# Patient Record
Sex: Female | Born: 1995 | Race: Black or African American | Hispanic: No | Smoking: Former smoker
Health system: Southern US, Community
[De-identification: ages and names within clinical notes are randomized; demographics above are authoritative.]

## PROBLEM LIST (undated history)

## (undated) DIAGNOSIS — J45909 Unspecified asthma, uncomplicated: Secondary | ICD-10-CM

## (undated) DIAGNOSIS — A6000 Herpesviral infection of urogenital system, unspecified: Secondary | ICD-10-CM

## (undated) DIAGNOSIS — M797 Fibromyalgia: Secondary | ICD-10-CM

## (undated) DIAGNOSIS — E282 Polycystic ovarian syndrome: Secondary | ICD-10-CM

## (undated) DIAGNOSIS — E7800 Pure hypercholesterolemia, unspecified: Secondary | ICD-10-CM

## (undated) DIAGNOSIS — E119 Type 2 diabetes mellitus without complications: Secondary | ICD-10-CM

## (undated) DIAGNOSIS — I10 Essential (primary) hypertension: Secondary | ICD-10-CM

## (undated) DIAGNOSIS — F419 Anxiety disorder, unspecified: Secondary | ICD-10-CM

## (undated) HISTORY — PX: TONSILLECTOMY AND ADENOIDECTOMY: SHX28

## (undated) HISTORY — DX: Type 2 diabetes mellitus without complications: E11.9

## (undated) HISTORY — PX: TONSILLECTOMY: SUR1361

## (undated) HISTORY — DX: Anxiety disorder, unspecified: F41.9

## (undated) HISTORY — DX: Fibromyalgia: M79.7

## (undated) HISTORY — DX: Essential (primary) hypertension: I10

## (undated) HISTORY — PX: OTHER SURGICAL HISTORY: SHX169

## (undated) HISTORY — DX: Polycystic ovarian syndrome: E28.2

---
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
## 2015-01-27 ENCOUNTER — Emergency Department (HOSPITAL_COMMUNITY)
Admission: EM | Admit: 2015-01-27 | Discharge: 2015-01-27 | Disposition: A | Discharge status: Home / Self Care | Payer: Medicaid Other | Attending: Emergency Medicine | Admitting: Emergency Medicine

## 2015-01-27 ENCOUNTER — Encounter (HOSPITAL_COMMUNITY): Payer: Self-pay | Admitting: *Deleted

## 2015-01-27 ENCOUNTER — Emergency Department (HOSPITAL_COMMUNITY): Payer: Medicaid Other

## 2015-01-27 DIAGNOSIS — S80211A Abrasion, right knee, initial encounter: Secondary | ICD-10-CM | POA: Diagnosis not present

## 2015-01-27 DIAGNOSIS — Z7951 Long term (current) use of inhaled steroids: Secondary | ICD-10-CM | POA: Insufficient documentation

## 2015-01-27 DIAGNOSIS — E1165 Type 2 diabetes mellitus with hyperglycemia: Secondary | ICD-10-CM | POA: Insufficient documentation

## 2015-01-27 DIAGNOSIS — M542 Cervicalgia: Secondary | ICD-10-CM

## 2015-01-27 DIAGNOSIS — M25561 Pain in right knee: Secondary | ICD-10-CM

## 2015-01-27 DIAGNOSIS — Y92410 Unspecified street and highway as the place of occurrence of the external cause: Secondary | ICD-10-CM | POA: Diagnosis not present

## 2015-01-27 DIAGNOSIS — S29001A Unspecified injury of muscle and tendon of front wall of thorax, initial encounter: Secondary | ICD-10-CM | POA: Diagnosis not present

## 2015-01-27 DIAGNOSIS — J45909 Unspecified asthma, uncomplicated: Secondary | ICD-10-CM | POA: Diagnosis not present

## 2015-01-27 DIAGNOSIS — S80212A Abrasion, left knee, initial encounter: Secondary | ICD-10-CM | POA: Insufficient documentation

## 2015-01-27 DIAGNOSIS — Z8619 Personal history of other infectious and parasitic diseases: Secondary | ICD-10-CM | POA: Insufficient documentation

## 2015-01-27 DIAGNOSIS — Y9389 Activity, other specified: Secondary | ICD-10-CM | POA: Diagnosis not present

## 2015-01-27 DIAGNOSIS — I10 Essential (primary) hypertension: Secondary | ICD-10-CM | POA: Diagnosis not present

## 2015-01-27 DIAGNOSIS — E780 Pure hypercholesterolemia: Secondary | ICD-10-CM | POA: Diagnosis not present

## 2015-01-27 DIAGNOSIS — Y998 Other external cause status: Secondary | ICD-10-CM | POA: Diagnosis not present

## 2015-01-27 DIAGNOSIS — Z792 Long term (current) use of antibiotics: Secondary | ICD-10-CM | POA: Diagnosis not present

## 2015-01-27 DIAGNOSIS — Z720 Tobacco use: Secondary | ICD-10-CM | POA: Diagnosis not present

## 2015-01-27 DIAGNOSIS — S199XXA Unspecified injury of neck, initial encounter: Secondary | ICD-10-CM | POA: Diagnosis not present

## 2015-01-27 DIAGNOSIS — Z79899 Other long term (current) drug therapy: Secondary | ICD-10-CM | POA: Insufficient documentation

## 2015-01-27 DIAGNOSIS — S8991XA Unspecified injury of right lower leg, initial encounter: Secondary | ICD-10-CM | POA: Diagnosis present

## 2015-01-27 LAB — BASIC METABOLIC PANEL
ANION GAP: 8 (ref 5–15)
BUN: 9 mg/dL (ref 6–20)
CHLORIDE: 102 mmol/L (ref 101–111)
CO2: 23 mmol/L (ref 22–32)
Calcium: 9 mg/dL (ref 8.9–10.3)
Creatinine, Ser: 0.46 mg/dL (ref 0.44–1.00)
GFR calc Af Amer: >60 mL/min (ref >60)
GFR calc non Af Amer: >60 mL/min (ref >60)
Glucose, Bld: 379 mg/dL — ABNORMAL HIGH (ref 65–99)
POTASSIUM: 3.7 mmol/L (ref 3.5–5.1)
Sodium: 133 mmol/L — ABNORMAL LOW (ref 135–145)

## 2015-01-27 LAB — CBC WITH DIFFERENTIAL/PLATELET
BASOS PCT: 0 % (ref 0–1)
Basophils Absolute: 0 10*3/uL (ref 0.0–0.1)
Eosinophils Absolute: 0 10*3/uL (ref 0.0–0.7)
Eosinophils Relative: 0 % (ref 0–5)
HCT: 36.6 % (ref 36.0–46.0)
Hemoglobin: 12 g/dL (ref 12.0–15.0)
Lymphocytes Relative: 21 % (ref 12–46)
Lymphs Abs: 2.1 10*3/uL (ref 0.7–4.0)
MCH: 24.6 pg — AB (ref 26.0–34.0)
MCHC: 32.8 g/dL (ref 30.0–36.0)
MCV: 75 fL — ABNORMAL LOW (ref 78.0–100.0)
MONO ABS: 0.7 10*3/uL (ref 0.1–1.0)
Monocytes Relative: 8 % (ref 3–12)
Neutro Abs: 6.9 10*3/uL (ref 1.7–7.7)
Neutrophils Relative %: 71 % (ref 43–77)
PLATELETS: 255 10*3/uL (ref 150–400)
RBC: 4.88 MIL/uL (ref 3.87–5.11)
RDW: 13.6 % (ref 11.5–15.5)
WBC: 9.7 10*3/uL (ref 4.0–10.5)

## 2015-01-27 LAB — I-STAT CHEM 8, ED
BUN: 7 mg/dL (ref 6–20)
CALCIUM ION: 1.21 mmol/L (ref 1.12–1.23)
Chloride: 102 mmol/L (ref 101–111)
Creatinine, Ser: 0.6 mg/dL (ref 0.44–1.00)
Glucose, Bld: 388 mg/dL — ABNORMAL HIGH (ref 65–99)
HCT: 39 % (ref 36.0–46.0)
HEMOGLOBIN: 13.3 g/dL (ref 12.0–15.0)
Potassium: 3.8 mmol/L (ref 3.5–5.1)
SODIUM: 137 mmol/L (ref 135–145)
TCO2: 22 mmol/L (ref 0–100)

## 2015-01-27 LAB — ETHANOL: Alcohol, Ethyl (B): <5 mg/dL (ref <5)

## 2015-01-27 LAB — I-STAT TROPONIN, ED: Troponin i, poc: 0 ng/mL (ref 0.00–0.08)

## 2015-01-27 LAB — POC URINE PREG, ED: Preg Test, Ur: NEGATIVE

## 2015-01-27 MED ORDER — CYCLOBENZAPRINE HCL 10 MG PO TABS: 10.0000 mg | ORAL_TABLET | Freq: Three times a day (TID) | ORAL | Status: DC | PRN

## 2015-01-27 MED ORDER — MORPHINE SULFATE 2 MG/ML IJ SOLN
2.0000 mg | Freq: Once | INTRAMUSCULAR | Status: AC
  Administered 2015-01-27: 2 mg via INTRAVENOUS
  Filled 2015-01-27: qty 1

## 2015-01-27 MED ORDER — SODIUM CHLORIDE 0.9 % IV BOLUS (SEPSIS)
2000.0000 mL | Freq: Once | INTRAVENOUS | Status: AC
  Administered 2015-01-27: 2000 mL via INTRAVENOUS

## 2015-01-27 MED ORDER — NAPROXEN 500 MG PO TABS: 500.0000 mg | ORAL_TABLET | Freq: Two times a day (BID) | ORAL | Status: DC | PRN

## 2015-01-27 MED ORDER — HYDROCODONE-ACETAMINOPHEN 5-325 MG PO TABS: 1.0000 | ORAL_TABLET | Freq: Four times a day (QID) | ORAL | Status: DC | PRN
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
## 2015-01-27 NOTE — ED Notes (Signed)
Bed: WA11 Expected date:  Expected time:  Means of arrival:  Comments: EMS 

## 2015-01-27 NOTE — ED Notes (Signed)
Pt ambulated to restroom with steady gait.
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
## 2015-02-19 ENCOUNTER — Ambulatory Visit (INDEPENDENT_AMBULATORY_CARE_PROVIDER_SITE_OTHER): Payer: Medicaid Other | Admitting: Certified Nurse Midwife

## 2015-02-19 ENCOUNTER — Encounter: Payer: Self-pay | Admitting: Certified Nurse Midwife

## 2015-02-19 VITALS — BP 134/84 | HR 92 | Temp 96.9°F | Ht 68.0 in | Wt 172.2 lb

## 2015-02-19 DIAGNOSIS — E118 Type 2 diabetes mellitus with unspecified complications: Secondary | ICD-10-CM

## 2015-02-19 MED ORDER — TERCONAZOLE 0.4 % VA CREA: 1.0000 | TOPICAL_CREAM | Freq: Every day | VAGINAL | Status: DC

## 2015-02-19 MED ORDER — FLUCONAZOLE 100 MG PO TABS: 100.0000 mg | ORAL_TABLET | Freq: Once | ORAL | Status: DC

## 2015-02-19 MED ORDER — ETONOGESTREL-ETHINYL ESTRADIOL 0.12-0.015 MG/24HR VA RING: 1.0000 | VAGINAL_RING | VAGINAL | Status: DC

## 2015-02-19 NOTE — Progress Notes (Signed)
Patient ID: Tamara Reyes, female   DOB: 08-04-1996, 19 y.o.   MRN: [id number]  Patient ID: Tamara Reyes, female   DOB: 26-Aug-1995, 19 y.o.   MRN: [id number]  Chief Complaint  Patient presents with  . Vaginitis    HPI [name] is a 19 y.o. female.  C/O vaginal itching for about 2 weeks.  Has uncontrolled DM2 & hypertension.  States that she drinks a lot of juice, water, etc.    HPI  Past Medical History  Diagnosis Date  . Hypertension   . PCOS (polycystic ovarian syndrome)   . Diabetes mellitus   . Asthma   . Hypercholesteremia   . Genital herpes     Past Surgical History  Procedure Laterality Date  . Tonsillectomy and adenoidectomy      tonsils only  . Tonsillectomy    . Abscess removal       Family History  Problem Relation Age of Onset  . Hypertension Mother   . Diabetes Mother     gestational  . Hypertension Maternal Grandmother   . Hypertension Maternal Grandfather   . Hypertension Paternal Grandmother   . Diabetes Paternal Grandmother     Social History History  Substance Use Topics  . Smoking status: Current Some Day Smoker    Types: Cigarettes  . Smokeless tobacco: Never Used  . Alcohol Use: No    No Known Allergies  Current Outpatient Prescriptions  Medication Sig Dispense Refill  . ACCU-CHEK FASTCLIX LANCETS MISC Use as directed for blood glucose checks three times daily (Patient taking differently: 4 (four) times a week. Use as directed for blood glucose checks three times daily) 102 each 3  . albuterol (PROVENTIL HFA;VENTOLIN HFA) 108 (90 BASE) MCG/ACT inhaler Inhale 2 puffs into the lungs every 6 (six) hours as needed for wheezing or shortness of breath.    . beclomethasone (QVAR) 80 MCG/ACT inhaler Inhale 2 puffs into the lungs 2 (two) times daily as needed (for shortness of breath/wheezing).     . Blood Glucose Monitoring Suppl (ACCU-CHEK NANO SMARTVIEW) W/DEVICE KIT 1 Device by Does not apply route 3 (three) times daily. (Patient taking differently:  1 Device by Does not apply route 4 (four) times a week. ) 1 kit 0  . cephALEXin (KEFLEX) 500 MG capsule Take 1 capsule (500 mg total) by mouth 4 (four) times daily. 28 capsule 0  . cetirizine (ZYRTEC) 10 MG tablet Take 10 mg by mouth at bedtime.     . cyclobenzaprine (FLEXERIL) 10 MG tablet Take 1 tablet (10 mg total) by mouth 3 (three) times daily as needed for muscle spasms. 9 tablet 0  . glucose blood (ACCU-CHEK SMARTVIEW) test strip Use as directed for blood glucose checks 3 times daily. (Patient taking differently: 4 (four) times a week. Use as directed for blood glucose checks 3 times daily.) 100 each 3  . hydrochlorothiazide (HYDRODIURIL) 25 MG tablet Take 1 tablet (25 mg total) by mouth daily. 30 tablet 2  . HYDROcodone-acetaminophen (NORCO) 5-325 MG per tablet Take 1 tablet by mouth every 6 (six) hours as needed for severe pain. 6 tablet 0  . HYDROcodone-acetaminophen (NORCO/VICODIN) 5-325 MG per tablet Take 1 tablet by mouth every 4 (four) hours as needed for moderate pain or severe pain. 15 tablet 0  . Insulin Pen Needle (INSUPEN PEN NEEDLES) 32G X 4 MM MISC Use as directed 50 each 3  . metFORMIN (GLUCOPHAGE) 500 MG tablet Take 1 tablet (500 mg total) by mouth 2 (two) times daily  with a meal. 60 tablet 2  . naproxen (NAPROSYN) 500 MG tablet Take 1 tablet (500 mg total) by mouth 2 (two) times daily as needed for mild pain, moderate pain or headache (TAKE WITH MEALS.). 20 tablet 0  . oxyCODONE-acetaminophen (PERCOCET/ROXICET) 5-325 MG per tablet Take 1-2 tablets by mouth every 4 (four) hours as needed for severe pain. 20 tablet 0  . simvastatin (ZOCOR) 10 MG tablet Take 1 tablet (10 mg total) by mouth every other day. 30 tablet 2  . sulfamethoxazole-trimethoprim (BACTRIM DS,SEPTRA DS) 800-160 MG per tablet Take 1 tablet by mouth 2 (two) times daily. X 7 days 14 tablet 0  . valACYclovir (VALTREX) 1000 MG tablet Take 1 tablet (1,000 mg total) by mouth daily. For initial outbreak take 1 tablet  twice a day for 7-10 days for initial outbreak. For subsequent outbreaks take 1 tablet daily for 5 days. May take 1 tablet daily for suppression. (Patient taking differently: Take 1,000 mg by mouth daily. For initial outbreak take 1 tablet daily) 60 tablet 12  . etonogestrel-ethinyl estradiol (NUVARING) 0.12-0.015 MG/24HR vaginal ring Place 1 each vaginally every 21 ( twenty-one) days. Insert 1 ring vaginally and leave in place for 3 weeks, then remove for 1 week. 1 each 11  . fluconazole (DIFLUCAN) 100 MG tablet Take 1 tablet (100 mg total) by mouth once. Repeat dose in 48-72 hour. 3 tablet 0  . terconazole (TERAZOL 7) 0.4 % vaginal cream Place 1 applicator vaginally at bedtime. 45 g 0   No current facility-administered medications for this visit.    Review of Systems Review of Systems Constitutional: negative for fatigue and weight loss Respiratory: negative for cough and wheezing Cardiovascular: negative for chest pain, fatigue and palpitations Gastrointestinal: negative for abdominal pain and change in bowel habits Genitourinary:+ vaginal discharge with itching Integument/breast: negative for nipple discharge Musculoskeletal:negative for myalgias Neurological: negative for gait problems and tremors Behavioral/Psych: negative for abusive relationship, depression Endocrine: negative for temperature intolerance     Blood pressure 134/84, pulse 92, temperature 96.9 F (36.1 C), height _0  (1.727 m), weight 172 lb 3.2 oz (78.109 kg), last menstrual period 01/29/2015.  Physical Exam Physical Exam General:   alert  Skin:   no rash or abnormalities  Lungs:   clear to auscultation bilaterally  Heart:   regular rate and rhythm, S1, S2 normal, no murmur, click, rub or gallop  Breasts:   normal without suspicious masses, skin or nipple changes or axillary nodes  Abdomen:  normal findings: no organomegaly, soft, non-tender and no hernia  Pelvis:  External genitalia: normal general  appearance Urinary system: urethral meatus normal and bladder without fullness, nontender Vaginal: normal without tenderness, induration or masses, + white cottage cheese vaginal discharge with erythema Cervix: no CMT Adnexa: normal bimanual exam Uterus: anteverted and non-tender, normal size    50% of 15 min visit spent on counseling and coordination of care.   Data Reviewed Previous medical hx, labs, meds  Assessment     Vulvovaginal Candidiasis Uncontrolled DM2 Obesity High risk sexual behavior Contraception counseling     Plan    Orders Placed This Encounter  Procedures  . SureSwab, Vaginosis/Vaginitis Plus  . Ambulatory referral to Internal Medicine    Referral Priority:  Routine    Referral Type:  Consultation    Referral Reason:  Specialty Services Required    Requested Specialty:  Internal Medicine    Number of Visits Requested:  1  . Referral to Nutrition and Diabetes Services  Referral Priority:  Routine    Referral Type:  Consultation    Referral Reason:  Specialty Services Required    Number of Visits Requested:  1   Meds ordered this encounter  Medications  . fluconazole (DIFLUCAN) 100 MG tablet    Sig: Take 1 tablet (100 mg total) by mouth once. Repeat dose in 48-72 hour.    Dispense:  3 tablet    Refill:  0  . etonogestrel-ethinyl estradiol (NUVARING) 0.12-0.015 MG/24HR vaginal ring    Sig: Place 1 each vaginally every 21 ( twenty-one) days. Insert 1 ring vaginally and leave in place for 3 weeks, then remove for 1 week.    Dispense:  1 each    Refill:  11  . terconazole (TERAZOL 7) 0.4 % vaginal cream    Sig: Place 1 applicator vaginally at bedtime.    Dispense:  45 g    Refill:  0    Follow up as needed or with annual Feb. 2017

## 2015-02-24 LAB — SURESWAB, VAGINOSIS/VAGINITIS PLUS
ATOPOBIUM VAGINAE: 6.5 Log (cells/mL)
BV CATEGORY: UNDETERMINED — AB
C. GLABRATA, DNA: NOT DETECTED
C. PARAPSILOSIS, DNA: NOT DETECTED
C. albicans, DNA: DETECTED — AB
C. trachomatis RNA, TMA: NOT DETECTED
C. tropicalis, DNA: NOT DETECTED
LACTOBACILLUS SPECIES: 6.6 Log (cells/mL)
MEGASPHAERA SPECIES: NOT DETECTED Log (cells/mL)
N. GONORRHOEAE RNA, TMA: NOT DETECTED
T. VAGINALIS RNA, QL TMA: NOT DETECTED

## 2015-02-26 ENCOUNTER — Other Ambulatory Visit: Payer: Self-pay | Admitting: Certified Nurse Midwife

## 2015-02-26 DIAGNOSIS — B9689 Other specified bacterial agents as the cause of diseases classified elsewhere: Secondary | ICD-10-CM

## 2015-02-26 DIAGNOSIS — N760 Acute vaginitis: Principal | ICD-10-CM

## 2015-02-26 MED ORDER — TINIDAZOLE 500 MG PO TABS: 2.0000 g | ORAL_TABLET | Freq: Every day | ORAL | Status: AC

## 2015-04-08 ENCOUNTER — Ambulatory Visit: Payer: Medicaid Other | Admitting: Dietician

## 2015-05-21 ENCOUNTER — Other Ambulatory Visit: Payer: Medicaid Other

## 2015-06-23 ENCOUNTER — Ambulatory Visit: Payer: Medicaid Other | Admitting: Certified Nurse Midwife

## 2015-09-14 ENCOUNTER — Encounter (HOSPITAL_COMMUNITY): Payer: Self-pay | Admitting: Emergency Medicine

## 2015-09-14 ENCOUNTER — Emergency Department (HOSPITAL_COMMUNITY): Payer: Medicaid Other

## 2015-09-14 ENCOUNTER — Emergency Department (HOSPITAL_COMMUNITY)
Admission: EM | Admit: 2015-09-14 | Discharge: 2015-09-14 | Disposition: A | Discharge status: Home / Self Care | Payer: Medicaid Other | Attending: Emergency Medicine | Admitting: Emergency Medicine

## 2015-09-14 DIAGNOSIS — E7800 Pure hypercholesterolemia, unspecified: Secondary | ICD-10-CM | POA: Insufficient documentation

## 2015-09-14 DIAGNOSIS — Y998 Other external cause status: Secondary | ICD-10-CM | POA: Diagnosis not present

## 2015-09-14 DIAGNOSIS — Z794 Long term (current) use of insulin: Secondary | ICD-10-CM | POA: Insufficient documentation

## 2015-09-14 DIAGNOSIS — Z79899 Other long term (current) drug therapy: Secondary | ICD-10-CM | POA: Insufficient documentation

## 2015-09-14 DIAGNOSIS — W208XXA Other cause of strike by thrown, projected or falling object, initial encounter: Secondary | ICD-10-CM | POA: Diagnosis not present

## 2015-09-14 DIAGNOSIS — S60051A Contusion of right little finger without damage to nail, initial encounter: Secondary | ICD-10-CM | POA: Insufficient documentation

## 2015-09-14 DIAGNOSIS — Z7984 Long term (current) use of oral hypoglycemic drugs: Secondary | ICD-10-CM | POA: Insufficient documentation

## 2015-09-14 DIAGNOSIS — Y9289 Other specified places as the place of occurrence of the external cause: Secondary | ICD-10-CM | POA: Insufficient documentation

## 2015-09-14 DIAGNOSIS — F17210 Nicotine dependence, cigarettes, uncomplicated: Secondary | ICD-10-CM | POA: Diagnosis not present

## 2015-09-14 DIAGNOSIS — Z792 Long term (current) use of antibiotics: Secondary | ICD-10-CM | POA: Insufficient documentation

## 2015-09-14 DIAGNOSIS — J45909 Unspecified asthma, uncomplicated: Secondary | ICD-10-CM | POA: Insufficient documentation

## 2015-09-14 DIAGNOSIS — I10 Essential (primary) hypertension: Secondary | ICD-10-CM | POA: Diagnosis not present

## 2015-09-14 DIAGNOSIS — S6000XA Contusion of unspecified finger without damage to nail, initial encounter: Secondary | ICD-10-CM

## 2015-09-14 DIAGNOSIS — Y9389 Activity, other specified: Secondary | ICD-10-CM | POA: Diagnosis not present

## 2015-09-14 DIAGNOSIS — E119 Type 2 diabetes mellitus without complications: Secondary | ICD-10-CM | POA: Diagnosis not present

## 2015-09-14 DIAGNOSIS — S6991XA Unspecified injury of right wrist, hand and finger(s), initial encounter: Secondary | ICD-10-CM | POA: Diagnosis present

## 2015-09-14 DIAGNOSIS — Z8619 Personal history of other infectious and parasitic diseases: Secondary | ICD-10-CM | POA: Insufficient documentation

## 2015-09-14 NOTE — Discharge Instructions (Signed)
TAKE TYLENOL OR IBUPROFEN FOR PAIN AS NEEDED. USE ICE TO REDUCE SWELLING.    Contusion A contusion is a deep bruise. Contusions happen when an injury causes bleeding under the skin. Symptoms of bruising include pain, swelling, and discolored skin. The skin may turn blue, purple, or yellow. HOME CARE   Rest the injured area.  If told, put ice on the injured area.  Put ice in a plastic bag.  Place a towel between your skin and the bag.  Leave the ice on for 20 minutes, 2-3 times per day.  If told, put light pressure (compression) on the injured area using an elastic bandage. Make sure the bandage is not too tight. Remove it and put it back on as told by your doctor.  If possible, raise (elevate) the injured area above the level of your heart while you are sitting or lying down.  Take over-the-counter and prescription medicines only as told by your doctor. GET HELP IF:  Your symptoms do not get better after several days of treatment.  Your symptoms get worse.  You have trouble moving the injured area. GET HELP RIGHT AWAY IF:   You have very bad pain.  You have a loss of feeling (numbness) in a hand or foot.  Your hand or foot turns pale or cold.   This information is not intended to replace advice given to you by your health care provider. Make sure you discuss any questions you have with your health care provider.   Document Released: 01/17/2008 Document Revised: 04/21/2015 Document Reviewed: 12/16/2014 Elsevier Interactive Patient Education 2016 [employer]  Cryotherapy Cryotherapy is when you put ice on your injury. Ice helps lessen pain and puffiness (swelling) after an injury. Ice works the best when you start using it in the first 24 to 48 hours after an injury. HOME CARE  Put a dry or damp towel between the ice pack and your skin.  You may press gently on the ice pack.  Leave the ice on for no more than 10 to 20 minutes at a time.  Check your skin after 5  minutes to make sure your skin is okay.  Rest at least 20 minutes between ice pack uses.  Stop using ice when your skin loses feeling (numbness).  Do not use ice on someone who cannot tell you when it hurts. This includes small children and people with memory problems (dementia). GET HELP RIGHT AWAY IF:  You have white spots on your skin.  Your skin turns blue or pale.  Your skin feels waxy or hard.  Your puffiness gets worse. MAKE SURE YOU:   Understand these instructions.  Will watch your condition.  Will get help right away if you are not doing well or get worse.   This information is not intended to replace advice given to you by your health care provider. Make sure you discuss any questions you have with your health care provider.   Document Released: 01/17/2008 Document Revised: 10/23/2011 Document Reviewed: 03/23/2011 Elsevier Interactive Patient Education [employer].

## 2015-09-14 NOTE — ED Provider Notes (Signed)
CSN: [id number]     Arrival date & time 09/14/15  2137 History  By signing my name below, I, Tamara Reyes, attest that this documentation has been prepared under the direction and in the presence of non-physician practitioner, Nehemiah Settle A Keyleen Cerrato PA-C. Electronically Signed: Rowan Reyes, Scribe. 09/14/2015. 10:16 PM.   Chief Complaint  Patient presents with  . Finger Injury   The history is provided by the patient. No language interpreter was used.   HPI Comments:  Tamara Reyes is a 20 y.o. female with a PMHx of HTN and DM who presents to the Emergency Department complaining of moderate, constant, throbbing pain in right pinky onset ~24 hrs ago s/p injury. Pt reports she was moving her bed frame by herself when the mattress fell and sandwiched her finger between the frame and mattress. She notes she can move the finger but her pain worsens with movement. Pt reports associated numbness in right pinky and mild radiating pain in right wrist. Pt states she has taken Ibuprofen with no relief. Pt reports no other symptoms or complaints at this time.  Past Medical History  Diagnosis Date  . Hypertension   . PCOS (polycystic ovarian syndrome)   . Diabetes mellitus   . Asthma   . Hypercholesteremia   . Genital herpes    Past Surgical History  Procedure Laterality Date  . Tonsillectomy and adenoidectomy      tonsils only  . Tonsillectomy    . Abscess removal      Family History  Problem Relation Age of Onset  . Hypertension Mother   . Diabetes Mother     gestational  . Hypertension Maternal Grandmother   . Hypertension Maternal Grandfather   . Hypertension Paternal Grandmother   . Diabetes Paternal Grandmother    Social History  Substance Use Topics  . Smoking status: Current Some Day Smoker    Types: Cigarettes  . Smokeless tobacco: Never Used  . Alcohol Use: No   OB History    Gravida Para Term Preterm AB TAB SAB Ectopic Multiple Living   0 0 0 0 0 0 0 0 0 0     Review of  Systems  Musculoskeletal: Positive for myalgias and arthralgias.  Neurological: Positive for numbness.   Allergies  Review of patient's allergies indicates no known allergies.  Home Medications   Prior to Admission medications   Medication Sig Start Date End Date Taking? Authorizing Provider  ACCU-CHEK FASTCLIX LANCETS MISC Use as directed for blood glucose checks three times daily Patient taking differently: 4 (four) times a week. Use as directed for blood glucose checks three times daily 11/24/14   Manya Silvas, CNM  albuterol (PROVENTIL HFA;VENTOLIN HFA) 108 (90 BASE) MCG/ACT inhaler Inhale 2 puffs into the lungs every 6 (six) hours as needed for wheezing or shortness of breath.    Historical Provider, MD  beclomethasone (QVAR) 80 MCG/ACT inhaler Inhale 2 puffs into the lungs 2 (two) times daily as needed (for shortness of breath/wheezing).     Historical Provider, MD  Blood Glucose Monitoring Suppl (ACCU-CHEK NANO SMARTVIEW) W/DEVICE KIT 1 Device by Does not apply route 3 (three) times daily. Patient taking differently: 1 Device by Does not apply route 4 (four) times a week.  01/09/14   Frazier Richards, MD  cephALEXin (KEFLEX) 500 MG capsule Take 1 capsule (500 mg total) by mouth 4 (four) times daily. 12/27/14   Clayton Bibles, PA-C  cetirizine (ZYRTEC) 10 MG tablet Take 10 mg by mouth at bedtime.  Historical Provider, MD  cyclobenzaprine (FLEXERIL) 10 MG tablet Take 1 tablet (10 mg total) by mouth 3 (three) times daily as needed for muscle spasms. 01/27/15   Mercedes Camprubi-Soms, PA-C  etonogestrel-ethinyl estradiol (NUVARING) 0.12-0.015 MG/24HR vaginal ring Place 1 each vaginally every 21 ( twenty-one) days. Insert 1 ring vaginally and leave in place for 3 weeks, then remove for 1 week. 02/19/15 02/11/16  Rachelle A Denney, CNM  fluconazole (DIFLUCAN) 100 MG tablet Take 1 tablet (100 mg total) by mouth once. Repeat dose in 48-72 hour. 02/19/15   Rachelle A Denney, CNM  glucose blood (ACCU-CHEK  SMARTVIEW) test strip Use as directed for blood glucose checks 3 times daily. Patient taking differently: 4 (four) times a week. Use as directed for blood glucose checks 3 times daily. 11/24/14   Manya Silvas, CNM  hydrochlorothiazide (HYDRODIURIL) 25 MG tablet Take 1 tablet (25 mg total) by mouth daily. 11/24/14   Manya Silvas, CNM  HYDROcodone-acetaminophen (NORCO) 5-325 MG per tablet Take 1 tablet by mouth every 6 (six) hours as needed for severe pain. 01/27/15   Mercedes Camprubi-Soms, PA-C  HYDROcodone-acetaminophen (NORCO/VICODIN) 5-325 MG per tablet Take 1 tablet by mouth every 4 (four) hours as needed for moderate pain or severe pain. 12/27/14   Clayton Bibles, PA-C  Insulin Pen Needle (INSUPEN PEN NEEDLES) 32G X 4 MM MISC Use as directed 01/09/14   Frazier Richards, MD  metFORMIN (GLUCOPHAGE) 500 MG tablet Take 1 tablet (500 mg total) by mouth 2 (two) times daily with a meal. 11/24/14   Manya Silvas, CNM  naproxen (NAPROSYN) 500 MG tablet Take 1 tablet (500 mg total) by mouth 2 (two) times daily as needed for mild pain, moderate pain or headache (TAKE WITH MEALS.). 01/27/15   Mercedes Camprubi-Soms, PA-C  oxyCODONE-acetaminophen (PERCOCET/ROXICET) 5-325 MG per tablet Take 1-2 tablets by mouth every 4 (four) hours as needed for severe pain. 11/24/14   Manya Silvas, CNM  simvastatin (ZOCOR) 10 MG tablet Take 1 tablet (10 mg total) by mouth every other day. 11/24/14   Manya Silvas, CNM  sulfamethoxazole-trimethoprim (BACTRIM DS,SEPTRA DS) 800-160 MG per tablet Take 1 tablet by mouth 2 (two) times daily. X 7 days 12/27/14   Clayton Bibles, PA-C  terconazole (TERAZOL 7) 0.4 % vaginal cream Place 1 applicator vaginally at bedtime. 02/19/15   Rachelle A Denney, CNM  valACYclovir (VALTREX) 1000 MG tablet Take 1 tablet (1,000 mg total) by mouth daily. For initial outbreak take 1 tablet twice a day for 7-10 days for initial outbreak. For subsequent outbreaks take 1 tablet daily for 5 days. May take 1 tablet daily for  suppression. Patient taking differently: Take 1,000 mg by mouth daily. For initial outbreak take 1 tablet daily 11/24/14   Erit[place] Smith, CNM   BP 140/93 mmHg  Pulse 78  Temp(Src) 98.2 F (36.8 C) (Oral)  Resp 20  SpO2 100%  LMP 08/24/2015 Physical Exam  Constitutional: She is oriented to person, place, and time. She appears well-developed and well-nourished.  HENT:  Head: Normocephalic and atraumatic.  Eyes: Conjunctivae are normal.  Cardiovascular: Normal rate and intact distal pulses.   Pulses:      Radial pulses are 2+ on the right side, and 2+ on the left side.  Normal capillary refill.  Pulmonary/Chest: Effort normal. No respiratory distress.  Musculoskeletal: Normal range of motion.  Normal ROM in right pinky; no edema in right pinky  Neurological: She is alert and oriented to person, place, and time. Coordination normal.  Skin: No  rash noted. No erythema.  Psychiatric: She has a normal mood and affect.  Nursing note and vitals reviewed.   ED Course  Procedures  DIAGNOSTIC STUDIES:  Oxygen Saturation is 100% on RA, normal by my interpretation.    COORDINATION OF CARE:  10:24 PM Discussed imaging results with pt. Discussed treatment plan with pt at bedside and pt agreed to plan.  Labs Review Labs Reviewed - No data to display  Imaging Review Dg Finger Little Right  09/14/2015  CLINICAL DATA:  Right little finger pain and swelling EXAM: RIGHT LITTLE FINGER 2+V COMPARISON:  None FINDINGS: There is no evidence of fracture or dislocation. There is no evidence of arthropathy or other focal bone abnormality. Soft tissues are unremarkable. IMPRESSION: Negative. Electronically Signed   By: Kerby Moors M.D.   On: 09/14/2015 22:08   I have personally reviewed and evaluated these images and lab results as part of my medical decision-making.   EKG Interpretation None     MDM   Final diagnoses:  None    Finger pain  Uncomplicated finger injury with negative  imaging requiring supportive care.   I personally performed the services described in this documentation, which was scribed in my presence. The recorded information has been reviewed and is accurate.     Charlann Lange, PA-C 09/15/15 4650  Ezequiel Essex, MD 09/15/15 1005

## 2015-09-14 NOTE — ED Notes (Signed)
Patient reports dropping bed frame on right pinky yesterday evening and c/o pain, inability to move same finger. No swelling or bruising noted to same.

## 2015-12-09 ENCOUNTER — Emergency Department (HOSPITAL_COMMUNITY)
Admission: EM | Admit: 2015-12-09 | Discharge: 2015-12-09 | Disposition: A | Discharge status: Home / Self Care | Payer: Medicaid Other | Attending: Emergency Medicine | Admitting: Emergency Medicine

## 2015-12-09 DIAGNOSIS — N72 Inflammatory disease of cervix uteri: Secondary | ICD-10-CM | POA: Insufficient documentation

## 2015-12-09 DIAGNOSIS — N3000 Acute cystitis without hematuria: Secondary | ICD-10-CM

## 2015-12-09 DIAGNOSIS — E1165 Type 2 diabetes mellitus with hyperglycemia: Secondary | ICD-10-CM | POA: Diagnosis not present

## 2015-12-09 DIAGNOSIS — Z794 Long term (current) use of insulin: Secondary | ICD-10-CM | POA: Diagnosis not present

## 2015-12-09 DIAGNOSIS — Z3202 Encounter for pregnancy test, result negative: Secondary | ICD-10-CM | POA: Diagnosis not present

## 2015-12-09 DIAGNOSIS — Z9119 Patient's noncompliance with other medical treatment and regimen: Secondary | ICD-10-CM | POA: Insufficient documentation

## 2015-12-09 DIAGNOSIS — Z9114 Patient's other noncompliance with medication regimen: Secondary | ICD-10-CM

## 2015-12-09 DIAGNOSIS — E7800 Pure hypercholesterolemia, unspecified: Secondary | ICD-10-CM | POA: Diagnosis not present

## 2015-12-09 DIAGNOSIS — J45909 Unspecified asthma, uncomplicated: Secondary | ICD-10-CM | POA: Diagnosis not present

## 2015-12-09 DIAGNOSIS — N760 Acute vaginitis: Secondary | ICD-10-CM | POA: Diagnosis not present

## 2015-12-09 DIAGNOSIS — Z7984 Long term (current) use of oral hypoglycemic drugs: Secondary | ICD-10-CM | POA: Diagnosis not present

## 2015-12-09 DIAGNOSIS — Z792 Long term (current) use of antibiotics: Secondary | ICD-10-CM | POA: Diagnosis not present

## 2015-12-09 DIAGNOSIS — I10 Essential (primary) hypertension: Secondary | ICD-10-CM | POA: Insufficient documentation

## 2015-12-09 DIAGNOSIS — F17210 Nicotine dependence, cigarettes, uncomplicated: Secondary | ICD-10-CM | POA: Diagnosis not present

## 2015-12-09 DIAGNOSIS — Z8619 Personal history of other infectious and parasitic diseases: Secondary | ICD-10-CM | POA: Insufficient documentation

## 2015-12-09 DIAGNOSIS — E282 Polycystic ovarian syndrome: Secondary | ICD-10-CM | POA: Diagnosis not present

## 2015-12-09 DIAGNOSIS — Z793 Long term (current) use of hormonal contraceptives: Secondary | ICD-10-CM | POA: Diagnosis not present

## 2015-12-09 DIAGNOSIS — R739 Hyperglycemia, unspecified: Secondary | ICD-10-CM

## 2015-12-09 DIAGNOSIS — Z79899 Other long term (current) drug therapy: Secondary | ICD-10-CM | POA: Diagnosis not present

## 2015-12-09 DIAGNOSIS — R1032 Left lower quadrant pain: Secondary | ICD-10-CM | POA: Diagnosis present

## 2015-12-09 DIAGNOSIS — B9689 Other specified bacterial agents as the cause of diseases classified elsewhere: Secondary | ICD-10-CM

## 2015-12-09 LAB — CBC
HCT: 42 % (ref 36.0–46.0)
Hemoglobin: 14.4 g/dL (ref 12.0–15.0)
MCH: 25.1 pg — ABNORMAL LOW (ref 26.0–34.0)
MCHC: 34.3 g/dL (ref 30.0–36.0)
MCV: 73.2 fL — ABNORMAL LOW (ref 78.0–100.0)
Platelets: 297 10*3/uL (ref 150–400)
RBC: 5.74 MIL/uL — ABNORMAL HIGH (ref 3.87–5.11)
RDW: 12.8 % (ref 11.5–15.5)
WBC: 14.7 10*3/uL — ABNORMAL HIGH (ref 4.0–10.5)

## 2015-12-09 LAB — URINE MICROSCOPIC-ADD ON

## 2015-12-09 LAB — WET PREP, GENITAL
Sperm: NONE SEEN
Trich, Wet Prep: NONE SEEN
Yeast Wet Prep HPF POC: NONE SEEN

## 2015-12-09 LAB — COMPREHENSIVE METABOLIC PANEL
ALT: 16 U/L (ref 14–54)
AST: 14 U/L — AB (ref 15–41)
Albumin: 4.1 g/dL (ref 3.5–5.0)
Alkaline Phosphatase: 71 U/L (ref 38–126)
Anion gap: 12 (ref 5–15)
BILIRUBIN TOTAL: 0.8 mg/dL (ref 0.3–1.2)
BUN: 7 mg/dL (ref 6–20)
CO2: 25 mmol/L (ref 22–32)
Calcium: 10.1 mg/dL (ref 8.9–10.3)
Chloride: 98 mmol/L — ABNORMAL LOW (ref 101–111)
Creatinine, Ser: 0.73 mg/dL (ref 0.44–1.00)
GFR calc Af Amer: >60 mL/min (ref >60)
GFR calc non Af Amer: >60 mL/min (ref >60)
Glucose, Bld: 429 mg/dL — ABNORMAL HIGH (ref 65–99)
Potassium: 4.2 mmol/L (ref 3.5–5.1)
Sodium: 135 mmol/L (ref 135–145)
TOTAL PROTEIN: 8 g/dL (ref 6.5–8.1)

## 2015-12-09 LAB — URINALYSIS, ROUTINE W REFLEX MICROSCOPIC
BILIRUBIN URINE: NEGATIVE
Glucose, UA: >1000 mg/dL — AB
KETONES UR: NEGATIVE mg/dL
NITRITE: NEGATIVE
PH: 6.5 (ref 5.0–8.0)
Protein, ur: NEGATIVE mg/dL
Specific Gravity, Urine: 1.042 — ABNORMAL HIGH (ref 1.005–1.030)

## 2015-12-09 LAB — CBG MONITORING, ED: Glucose-Capillary: 275 mg/dL — ABNORMAL HIGH (ref 65–99)

## 2015-12-09 LAB — LIPASE, BLOOD: Lipase: 17 U/L (ref 11–51)

## 2015-12-09 LAB — PREGNANCY, URINE: Preg Test, Ur: NEGATIVE

## 2015-12-09 MED ORDER — SODIUM CHLORIDE 0.9 % IV BOLUS (SEPSIS)
1000.0000 mL | Freq: Once | INTRAVENOUS | Status: AC
  Administered 2015-12-09: 1000 mL via INTRAVENOUS

## 2015-12-09 MED ORDER — HYDROCODONE-ACETAMINOPHEN 5-325 MG PO TABS: 2.0000 | ORAL_TABLET | ORAL | Status: DC | PRN

## 2015-12-09 MED ORDER — DEXTROSE 5 % IV SOLN
250.0000 mg | Freq: Once | INTRAVENOUS | Status: AC
  Administered 2015-12-09: 250 mg via INTRAVENOUS
  Filled 2015-12-09: qty 250

## 2015-12-09 MED ORDER — AZITHROMYCIN 250 MG PO TABS
1000.0000 mg | ORAL_TABLET | Freq: Once | ORAL | Status: AC
  Administered 2015-12-09: 1000 mg via ORAL
  Filled 2015-12-09: qty 4

## 2015-12-09 MED ORDER — METRONIDAZOLE 500 MG PO TABS: 500.0000 mg | ORAL_TABLET | Freq: Two times a day (BID) | ORAL | Status: DC

## 2015-12-09 MED ORDER — SULFAMETHOXAZOLE-TRIMETHOPRIM 800-160 MG PO TABS: 1.0000 | ORAL_TABLET | Freq: Two times a day (BID) | ORAL | Status: AC

## 2015-12-09 MED ORDER — NITROFURANTOIN MONOHYD MACRO 100 MG PO CAPS
100.0000 mg | ORAL_CAPSULE | Freq: Once | ORAL | Status: AC
  Administered 2015-12-09: 100 mg via ORAL
  Filled 2015-12-09: qty 1

## 2015-12-09 MED ORDER — METRONIDAZOLE 500 MG PO TABS
500.0000 mg | ORAL_TABLET | Freq: Once | ORAL | Status: AC
  Administered 2015-12-09: 500 mg via ORAL
  Filled 2015-12-09: qty 1

## 2015-12-09 MED ORDER — INSULIN ASPART 100 UNIT/ML IV SOLN
8.0000 [IU] | Freq: Once | INTRAVENOUS | Status: AC
  Administered 2015-12-09: 8 [IU] via INTRAVENOUS
  Filled 2015-12-09: qty 0.08

## 2015-12-09 MED ORDER — ONDANSETRON HCL 4 MG/2ML IJ SOLN
4.0000 mg | Freq: Once | INTRAMUSCULAR | Status: AC
  Administered 2015-12-09: 4 mg via INTRAVENOUS
  Filled 2015-12-09: qty 2

## 2015-12-09 MED ORDER — MORPHINE SULFATE (PF) 4 MG/ML IV SOLN
4.0000 mg | Freq: Once | INTRAVENOUS | Status: AC
  Administered 2015-12-09: 4 mg via INTRAVENOUS
  Filled 2015-12-09: qty 1

## 2015-12-09 NOTE — ED Provider Notes (Signed)
CSN: [id number]     Arrival date & time 12/09/15  1801 History   First MD Initiated Contact with Patient 12/09/15 1905     Chief Complaint  Patient presents with  . Abdominal Pain   PT REPORTS LUQ AND LLQ ABD PAIN FOR THE PAST FEW DAYS.  THE PT SAID IT IS NOT GETTING ANY BETTER.  SHE HAS NAUSEA, BUT NO FEVER.  PT SAID THAT SHE HAS NOT BEEN TAKING HER INSULIN AND METFORMIN FOR THE PAST WEEK DUE TO LITTLE APPETITE.  (Consider location/radiation/quality/duration/timing/severity/associated sxs/prior Treatment) The history is provided by the patient.    Past Medical History  Diagnosis Date  . Hypertension   . PCOS (polycystic ovarian syndrome)   . Diabetes mellitus   . Asthma   . Hypercholesteremia   . Genital herpes    Past Surgical History  Procedure Laterality Date  . Tonsillectomy and adenoidectomy      tonsils only  . Tonsillectomy    . Abscess removal      Family History  Problem Relation Age of Onset  . Hypertension Mother   . Diabetes Mother     gestational  . Hypertension Maternal Grandmother   . Hypertension Maternal Grandfather   . Hypertension Paternal Grandmother   . Diabetes Paternal Grandmother    Social History  Substance Use Topics  . Smoking status: Current Some Day Smoker    Types: Cigarettes  . Smokeless tobacco: Never Used  . Alcohol Use: No   OB History    Gravida Para Term Preterm AB TAB SAB Ectopic Multiple Living   '0 0 0 0 0 0 0 0 0 0 '$     Review of Systems  Gastrointestinal: Positive for abdominal pain.  All other systems reviewed and are negative.     Allergies  Review of patient's allergies indicates no known allergies.  Home Medications   Prior to Admission medications   Medication Sig Start Date End Date Taking? Authorizing Provider  cetirizine (ZYRTEC) 10 MG tablet Take 10 mg by mouth at bedtime.    Yes Historical Provider, MD  cyclobenzaprine (FLEXERIL) 10 MG tablet Take 1 tablet (10 mg total) by mouth 3 (three) times daily  as needed for muscle spasms. 01/27/15  Yes Mercedes Camprubi-Soms, PA-C  hydrochlorothiazide (HYDRODIURIL) 25 MG tablet Take 1 tablet (25 mg total) by mouth daily. 11/24/14  Yes Manya Silvas, CNM  ipratropium (ATROVENT) 0.03 % nasal spray Place 1 spray into both nostrils 2 (two) times daily.  09/29/15  Yes Historical Provider, MD  LANTUS SOLOSTAR 100 UNIT/ML Solostar Pen Inject 20 Units into the skin daily.  09/29/15  Yes Historical Provider, MD  metFORMIN (GLUCOPHAGE) 850 MG tablet Take 850 mg by mouth 2 (two) times daily. 11/03/15  Yes Historical Provider, MD  polyethylene glycol (MIRALAX / GLYCOLAX) packet Take 17 g by mouth daily as needed for mild constipation.   Yes Historical Provider, MD  simvastatin (ZOCOR) 10 MG tablet Take 1 tablet (10 mg total) by mouth every other day. 11/24/14  Yes Manya Silvas, CNM  traZODone (DESYREL) 50 MG tablet Take 50 mg by mouth at bedtime.   Yes Historical Provider, MD  ACCU-CHEK FASTCLIX LANCETS MISC Use as directed for blood glucose checks three times daily Patient taking differently: 4 (four) times a week. Use as directed for blood glucose checks three times daily 11/24/14   Manya Silvas, CNM  albuterol (PROVENTIL HFA;VENTOLIN HFA) 108 (90 BASE) MCG/ACT inhaler Inhale 2 puffs into the lungs every 6 (six) hours as  needed for wheezing or shortness of breath.    Historical Provider, MD  beclomethasone (QVAR) 80 MCG/ACT inhaler Inhale 2 puffs into the lungs 2 (two) times daily as needed (for shortness of breath/wheezing).     Historical Provider, MD  Blood Glucose Monitoring Suppl (ACCU-CHEK NANO SMARTVIEW) W/DEVICE KIT 1 Device by Does not apply route 3 (three) times daily. Patient taking differently: 1 Device by Does not apply route 4 (four) times a week.  01/09/14   Abram Sander, MD  cephALEXin (KEFLEX) 500 MG capsule Take 1 capsule (500 mg total) by mouth 4 (four) times daily. Patient not taking: Reported on 12/09/2015 12/27/14   Trixie Dredge, PA-C   etonogestrel-ethinyl estradiol (NUVARING) 0.12-0.015 MG/24HR vaginal ring Place 1 each vaginally every 21 ( twenty-one) days. Insert 1 ring vaginally and leave in place for 3 weeks, then remove for 1 week. Patient not taking: Reported on 12/09/2015 02/19/15 02/11/16  Rachelle A Denney, CNM  fluconazole (DIFLUCAN) 100 MG tablet Take 1 tablet (100 mg total) by mouth once. Repeat dose in 48-72 hour. Patient not taking: Reported on 12/09/2015 02/19/15   Rachelle A Denney, CNM  glucose blood (ACCU-CHEK SMARTVIEW) test strip Use as directed for blood glucose checks 3 times daily. Patient taking differently: 4 (four) times a week. Use as directed for blood glucose checks 3 times daily. 11/24/14   Dorathy Kinsman, CNM  HYDROcodone-acetaminophen (NORCO/VICODIN) 5-325 MG tablet Take 2 tablets by mouth every 4 (four) hours as needed. 12/09/15   Jacalyn Lefevre, MD  Insulin Pen Needle (INSUPEN PEN NEEDLES) 32G X 4 MM MISC Use as directed 01/09/14   Abram Sander, MD  metFORMIN (GLUCOPHAGE) 500 MG tablet Take 1 tablet (500 mg total) by mouth 2 (two) times daily with a meal. Patient not taking: Reported on 12/09/2015 11/24/14   Dorathy Kinsman, CNM  metroNIDAZOLE (FLAGYL) 500 MG tablet Take 1 tablet (500 mg total) by mouth 2 (two) times daily. 12/09/15   Jacalyn Lefevre, MD  naproxen (NAPROSYN) 500 MG tablet Take 1 tablet (500 mg total) by mouth 2 (two) times daily as needed for mild pain, moderate pain or headache (TAKE WITH MEALS.). Patient not taking: Reported on 12/09/2015 01/27/15   Mercedes Camprubi-Soms, PA-C  sulfamethoxazole-trimethoprim (BACTRIM DS,SEPTRA DS) 800-160 MG tablet Take 1 tablet by mouth 2 (two) times daily. 12/09/15 12/16/15  Jacalyn Lefevre, MD  terconazole (TERAZOL 7) 0.4 % vaginal cream Place 1 applicator vaginally at bedtime. Patient not taking: Reported on 12/09/2015 02/19/15   Roe Coombs, CNM  valACYclovir (VALTREX) 1000 MG tablet Take 1 tablet (1,000 mg total) by mouth daily. For initial outbreak take  1 tablet twice a day for 7-10 days for initial outbreak. For subsequent outbreaks take 1 tablet daily for 5 days. May take 1 tablet daily for suppression. Patient not taking: Reported on 12/09/2015 11/24/14   Dorathy Kinsman, CNM   BP 135/89 mmHg  Pulse 120  Temp(Src) 98.8 F (37.1 C) (Oral)  Resp 18  SpO2 97% Physical Exam  Constitutional: She is oriented to person, place, and time. She appears well-developed and well-nourished.  HENT:  Head: Normocephalic and atraumatic.  Right Ear: External ear normal.  Left Ear: External ear normal.  Eyes: Conjunctivae are normal. Pupils are equal, round, and reactive to light.  Neck: Normal range of motion. Neck supple.  Cardiovascular: Normal rate, regular rhythm and normal heart sounds.   Pulmonary/Chest: Effort normal and breath sounds normal.  Abdominal: There is tenderness.  Genitourinary: Uterus normal. Vaginal discharge found.  Musculoskeletal: Normal range of motion.  Neurological: She is alert and oriented to person, place, and time.  Skin: Skin is warm and dry.  Psychiatric: She has a normal mood and affect. Her behavior is normal.  Nursing note and vitals reviewed.   ED Course  Procedures (including critical care time) Labs Review Labs Reviewed  WET PREP, GENITAL - Abnormal; Notable for the following:    Clue Cells Wet Prep HPF POC PRESENT (*)    WBC, Wet Prep HPF POC MODERATE (*)    All other components within normal limits  COMPREHENSIVE METABOLIC PANEL - Abnormal; Notable for the following:    Chloride 98 (*)    Glucose, Bld 429 (*)    AST 14 (*)    All other components within normal limits  CBC - Abnormal; Notable for the following:    WBC 14.7 (*)    RBC 5.74 (*)    MCV 73.2 (*)    MCH 25.1 (*)    All other components within normal limits  URINALYSIS, ROUTINE W REFLEX MICROSCOPIC (NOT AT [facility]) - Abnormal; Notable for the following:    APPearance CLOUDY (*)    Specific Gravity, Urine 1.042 (*)    Glucose, UA >1000  (*)    Hgb urine dipstick SMALL (*)    Leukocytes, UA MODERATE (*)    All other components within normal limits  URINE MICROSCOPIC-ADD ON - Abnormal; Notable for the following:    Squamous Epithelial / LPF 0-5 (*)    Bacteria, UA MANY (*)    All other components within normal limits  LIPASE, BLOOD  PREGNANCY, URINE  CBG MONITORING, ED  GC/CHLAMYDIA PROBE AMP ([place]) NOT AT [facility]    Imaging Review No results found. I have personally reviewed and evaluated these images and lab results as part of my medical decision-making.   EKG Interpretation None      MDM  PT INSTR TO MAINTAIN TIGHTER CONTROL OVER BLOOD SUGAR.  SHE IS INSTR TO RETURN IF WORSE.  Final diagnoses:  Acute cystitis without hematuria  Bacterial vaginitis  Hyperglycemia  Noncompliance with medications  Cervicitis       Isla Pence, MD 12/09/15 2330

## 2015-12-09 NOTE — Discharge Instructions (Signed)
Bacterial Vaginosis Bacterial vaginosis is an infection of the vagina. It happens when too many germs (bacteria) grow in the vagina. Having this infection puts you at risk for getting other infections from sex. Treating this infection can help lower your risk for other infections, such as:   Chlamydia.  Gonorrhea.  HIV.  Herpes. HOME CARE  Take your medicine as told by your doctor.  Finish your medicine even if you start to feel better.  Tell your sex partner that you have an infection. They should see their doctor for treatment.  During treatment:  Avoid sex or use condoms correctly.  Do not douche.  Do not drink alcohol unless your doctor tells you it is ok.  Do not breastfeed unless your doctor tells you it is ok. GET HELP IF:  You are not getting better after 3 days of treatment.  You have more grey fluid (discharge) coming from your vagina than before.  You have more pain than before.  You have a fever. MAKE SURE YOU:   Understand these instructions.  Will watch your condition.  Will get help right away if you are not doing well or get worse.   This information is not intended to replace advice given to you by your health care provider. Make sure you discuss any questions you have with your health care provider.   Document Released: 05/09/2008 Document Revised: 08/21/2014 Document Reviewed: 03/12/2013 Elsevier Interactive Patient Education 2016 Elsevier Inc.  Blood Glucose Monitoring, Adult Monitoring your blood glucose (also know as blood sugar) helps you to manage your diabetes. It also helps you and your health care provider monitor your diabetes and determine how well your treatment plan is working. WHY SHOULD YOU MONITOR YOUR BLOOD GLUCOSE?  It can help you understand how food, exercise, and medicine affect your blood glucose.  It allows you to know what your blood glucose is at any given moment. You can quickly tell if you are having low blood  glucose (hypoglycemia) or high blood glucose (hyperglycemia).  It can help you and your health care provider know how to adjust your medicines.  It can help you understand how to manage an illness or adjust medicine for exercise. WHEN SHOULD YOU TEST? Your health care provider will help you decide how often you should check your blood glucose. This may depend on the type of diabetes you have, your diabetes control, or the types of medicines you are taking. Be sure to write down all of your blood glucose readings so that this information can be reviewed with your health care provider. See below for examples of testing times that your health care provider may suggest. Type 1 Diabetes  Test at least 2 times per day if your diabetes is well controlled, if you are using an insulin pump, or if you perform multiple daily injections.  If your diabetes is not well controlled or if you are sick, you may need to test more often.  It is a good idea to also test:  Before every insulin injection.  Before and after exercise.  Between meals and 2 hours after a meal.  Occasionally between 2:00 a.m. and 3:00 a.m. Type 2 Diabetes  If you are taking insulin, test at least 2 times per day. However, it is best to test before every insulin injection.  If you take medicines by mouth (orally), test 2 times a day.  If you are on a controlled diet, test once a day.  If your diabetes is not  well controlled or if you are sick, you may need to monitor more often. HOW TO MONITOR YOUR BLOOD GLUCOSE Supplies Needed  Blood glucose meter.  Test strips for your meter. Each meter has its own strips. You must use the strips that go with your own meter.  A pricking needle (lancet).  A device that holds the lancet (lancing device).  A journal or log book to write down your results. Procedure  Wash your hands with soap and water. Alcohol is not preferred.  Prick the side of your finger (not the tip) with the  lancet.  Gently milk the finger until a small drop of blood appears.  Follow the instructions that come with your meter for inserting the test strip, applying blood to the strip, and using your blood glucose meter. Other Areas to Get Blood for Testing Some meters allow you to use other areas of your body (other than your finger) to test your blood. These areas are called alternative sites. The most common alternative sites are:  The forearm.  The thigh.  The back area of the lower leg.  The palm of the hand. The blood flow in these areas is slower. Therefore, the blood glucose values you get may be delayed, and the numbers are different from what you would get from your fingers. Do not use alternative sites if you think you are having hypoglycemia. Your reading will not be accurate. Always use a finger if you are having hypoglycemia. Also, if you cannot feel your lows (hypoglycemia unawareness), always use your fingers for your blood glucose checks. ADDITIONAL TIPS FOR GLUCOSE MONITORING  Do not reuse lancets.  Always carry your supplies with you.  All blood glucose meters have a 24-hour "hotline" number to call if you have questions or need help.  Adjust (calibrate) your blood glucose meter with a control solution after finishing a few boxes of strips. BLOOD GLUCOSE RECORD KEEPING It is a good idea to keep a daily record or log of your blood glucose readings. Most glucose meters, if not all, keep your glucose records stored in the meter. Some meters come with the ability to download your records to your home computer. Keeping a record of your blood glucose readings is especially helpful if you are wanting to look for patterns. Make notes to go along with the blood glucose readings because you might forget what happened at that exact time. Keeping good records helps you and your health care provider to work together to achieve good diabetes management.    This information is not intended  to replace advice given to you by your health care provider. Make sure you discuss any questions you have with your health care provider.   Document Released: 08/03/2003 Document Revised: 08/21/2014 Document Reviewed: 12/23/2012 Elsevier Interactive Patient Education [employer].

## 2015-12-09 NOTE — ED Notes (Signed)
Bed: WA02 Expected date:  Expected time:  Means of arrival:  Comments: Triage: [place]

## 2015-12-09 NOTE — ED Notes (Signed)
Left upper quadrant abd pain since, sharp, on-going since Sunday. Last normal BM was on Tuesday, c/o nausea, denies vomiting/fever.

## 2015-12-09 NOTE — ED Notes (Signed)
Notified RN,Rick pt. CBG 275.

## 2015-12-10 LAB — GC/CHLAMYDIA PROBE AMP (~~LOC~~) NOT AT ARMC
Chlamydia: POSITIVE — AB
NEISSERIA GONORRHEA: POSITIVE — AB

## 2015-12-13 ENCOUNTER — Telehealth (HOSPITAL_BASED_OUTPATIENT_CLINIC_OR_DEPARTMENT_OTHER): Payer: Self-pay | Admitting: Emergency Medicine

## 2016-02-29 ENCOUNTER — Telehealth: Payer: Self-pay | Admitting: *Deleted

## 2016-02-29 NOTE — Telephone Encounter (Signed)
(+)  STD, no response to phone or letter sent to home, unable to notify of (+) results

## 2016-03-06 ENCOUNTER — Other Ambulatory Visit: Payer: Self-pay | Admitting: Certified Nurse Midwife

## 2016-05-01 ENCOUNTER — Emergency Department (HOSPITAL_COMMUNITY)
Admission: EM | Admit: 2016-05-01 | Discharge: 2016-05-01 | Disposition: A | Discharge status: Home / Self Care | Payer: Medicaid Other | Attending: Emergency Medicine | Admitting: Emergency Medicine

## 2016-05-01 ENCOUNTER — Encounter (HOSPITAL_COMMUNITY): Payer: Self-pay

## 2016-05-01 DIAGNOSIS — Y92410 Unspecified street and highway as the place of occurrence of the external cause: Secondary | ICD-10-CM | POA: Insufficient documentation

## 2016-05-01 DIAGNOSIS — Y9389 Activity, other specified: Secondary | ICD-10-CM | POA: Diagnosis not present

## 2016-05-01 DIAGNOSIS — E119 Type 2 diabetes mellitus without complications: Secondary | ICD-10-CM | POA: Insufficient documentation

## 2016-05-01 DIAGNOSIS — I10 Essential (primary) hypertension: Secondary | ICD-10-CM | POA: Diagnosis not present

## 2016-05-01 DIAGNOSIS — Z792 Long term (current) use of antibiotics: Secondary | ICD-10-CM | POA: Diagnosis not present

## 2016-05-01 DIAGNOSIS — J45909 Unspecified asthma, uncomplicated: Secondary | ICD-10-CM | POA: Insufficient documentation

## 2016-05-01 DIAGNOSIS — Y999 Unspecified external cause status: Secondary | ICD-10-CM | POA: Insufficient documentation

## 2016-05-01 DIAGNOSIS — S3992XA Unspecified injury of lower back, initial encounter: Secondary | ICD-10-CM | POA: Diagnosis present

## 2016-05-01 DIAGNOSIS — F17210 Nicotine dependence, cigarettes, uncomplicated: Secondary | ICD-10-CM | POA: Diagnosis not present

## 2016-05-01 DIAGNOSIS — Z79899 Other long term (current) drug therapy: Secondary | ICD-10-CM | POA: Diagnosis not present

## 2016-05-01 DIAGNOSIS — Z7984 Long term (current) use of oral hypoglycemic drugs: Secondary | ICD-10-CM | POA: Diagnosis not present

## 2016-05-01 DIAGNOSIS — S39012A Strain of muscle, fascia and tendon of lower back, initial encounter: Secondary | ICD-10-CM | POA: Insufficient documentation

## 2016-05-01 DIAGNOSIS — Z794 Long term (current) use of insulin: Secondary | ICD-10-CM | POA: Insufficient documentation

## 2016-05-01 MED ORDER — NAPROXEN 500 MG PO TABS: 500.0000 mg | ORAL_TABLET | Freq: Two times a day (BID) | ORAL | 0 refills | Status: DC

## 2016-05-01 MED ORDER — CYCLOBENZAPRINE HCL 10 MG PO TABS
10.0000 mg | ORAL_TABLET | Freq: Once | ORAL | Status: AC
  Administered 2016-05-01: 10 mg via ORAL
  Filled 2016-05-01: qty 1

## 2016-05-01 MED ORDER — CYCLOBENZAPRINE HCL 10 MG PO TABS: 10.0000 mg | ORAL_TABLET | Freq: Two times a day (BID) | ORAL | 0 refills | Status: DC | PRN

## 2016-05-01 MED ORDER — NAPROXEN 500 MG PO TABS
500.0000 mg | ORAL_TABLET | Freq: Once | ORAL | Status: AC
  Administered 2016-05-01: 500 mg via ORAL
  Filled 2016-05-01: qty 1
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
## 2016-05-28 ENCOUNTER — Encounter (HOSPITAL_COMMUNITY): Payer: Self-pay | Admitting: *Deleted

## 2016-05-28 ENCOUNTER — Inpatient Hospital Stay (HOSPITAL_COMMUNITY): Payer: Medicaid Other

## 2016-05-28 ENCOUNTER — Inpatient Hospital Stay (HOSPITAL_COMMUNITY)
Admission: AD | Admit: 2016-05-28 | Discharge: 2016-05-29 | Disposition: A | Discharge status: Home / Self Care | Payer: Medicaid Other | Source: Ambulatory Visit | Attending: Emergency Medicine | Admitting: Emergency Medicine

## 2016-05-28 DIAGNOSIS — R1030 Lower abdominal pain, unspecified: Secondary | ICD-10-CM

## 2016-05-28 DIAGNOSIS — E1165 Type 2 diabetes mellitus with hyperglycemia: Secondary | ICD-10-CM | POA: Insufficient documentation

## 2016-05-28 DIAGNOSIS — Z87891 Personal history of nicotine dependence: Secondary | ICD-10-CM | POA: Insufficient documentation

## 2016-05-28 DIAGNOSIS — I10 Essential (primary) hypertension: Secondary | ICD-10-CM | POA: Diagnosis not present

## 2016-05-28 DIAGNOSIS — A599 Trichomoniasis, unspecified: Secondary | ICD-10-CM | POA: Insufficient documentation

## 2016-05-28 DIAGNOSIS — Z794 Long term (current) use of insulin: Secondary | ICD-10-CM | POA: Diagnosis not present

## 2016-05-28 DIAGNOSIS — Z79899 Other long term (current) drug therapy: Secondary | ICD-10-CM | POA: Insufficient documentation

## 2016-05-28 DIAGNOSIS — R112 Nausea with vomiting, unspecified: Secondary | ICD-10-CM | POA: Diagnosis not present

## 2016-05-28 DIAGNOSIS — Z7984 Long term (current) use of oral hypoglycemic drugs: Secondary | ICD-10-CM | POA: Diagnosis not present

## 2016-05-28 DIAGNOSIS — J45909 Unspecified asthma, uncomplicated: Secondary | ICD-10-CM | POA: Diagnosis not present

## 2016-05-28 DIAGNOSIS — R739 Hyperglycemia, unspecified: Secondary | ICD-10-CM | POA: Diagnosis not present

## 2016-05-28 LAB — CBC
HEMATOCRIT: 38.7 % (ref 36.0–46.0)
Hemoglobin: 13.5 g/dL (ref 12.0–15.0)
MCH: 26.2 pg (ref 26.0–34.0)
MCHC: 34.9 g/dL (ref 30.0–36.0)
MCV: 75.1 fL — AB (ref 78.0–100.0)
Platelets: 268 10*3/uL (ref 150–400)
RBC: 5.15 MIL/uL — ABNORMAL HIGH (ref 3.87–5.11)
RDW: 12.8 % (ref 11.5–15.5)
WBC: 9.1 10*3/uL (ref 4.0–10.5)

## 2016-05-28 LAB — COMPREHENSIVE METABOLIC PANEL
ALT: 12 U/L — ABNORMAL LOW (ref 14–54)
AST: 14 U/L — AB (ref 15–41)
Albumin: 3.6 g/dL (ref 3.5–5.0)
Alkaline Phosphatase: 52 U/L (ref 38–126)
Anion gap: 8 (ref 5–15)
BILIRUBIN TOTAL: 0.7 mg/dL (ref 0.3–1.2)
BUN: 6 mg/dL (ref 6–20)
CO2: 26 mmol/L (ref 22–32)
CREATININE: 0.59 mg/dL (ref 0.44–1.00)
Calcium: 9.5 mg/dL (ref 8.9–10.3)
Chloride: 100 mmol/L — ABNORMAL LOW (ref 101–111)
GFR calc Af Amer: >60 mL/min (ref >60)
Glucose, Bld: 424 mg/dL — ABNORMAL HIGH (ref 65–99)
POTASSIUM: 3.8 mmol/L (ref 3.5–5.1)
Sodium: 134 mmol/L — ABNORMAL LOW (ref 135–145)
TOTAL PROTEIN: 7.1 g/dL (ref 6.5–8.1)

## 2016-05-28 LAB — WET PREP, GENITAL
Sperm: NONE SEEN
Yeast Wet Prep HPF POC: NONE SEEN

## 2016-05-28 LAB — I-STAT VENOUS BLOOD GAS, ED
Acid-base deficit: 1 mmol/L (ref 0.0–2.0)
Bicarbonate: 24.9 mmol/L (ref 20.0–28.0)
O2 SAT: 70 %
PCO2 VEN: 45 mmHg (ref 44.0–60.0)
PH VEN: 7.352 (ref 7.250–7.430)
TCO2: 26 mmol/L (ref 0–100)
pO2, Ven: 38 mmHg (ref 32.0–45.0)

## 2016-05-28 LAB — URINALYSIS, ROUTINE W REFLEX MICROSCOPIC
BILIRUBIN URINE: NEGATIVE
Glucose, UA: >1000 mg/dL — AB
KETONES UR: NEGATIVE mg/dL
Leukocytes, UA: NEGATIVE
NITRITE: NEGATIVE
PH: 6.5 (ref 5.0–8.0)
PROTEIN: NEGATIVE mg/dL
Specific Gravity, Urine: 1.01 (ref 1.005–1.030)

## 2016-05-28 LAB — GLUCOSE, CAPILLARY
GLUCOSE-CAPILLARY: 361 mg/dL — AB (ref 65–99)
GLUCOSE-CAPILLARY: 305 mg/dL — AB (ref 65–99)
Glucose-Capillary: 393 mg/dL — ABNORMAL HIGH (ref 65–99)

## 2016-05-28 LAB — CBG MONITORING, ED
GLUCOSE-CAPILLARY: 227 mg/dL — AB (ref 65–99)
GLUCOSE-CAPILLARY: 187 mg/dL — AB (ref 65–99)

## 2016-05-28 LAB — URINE MICROSCOPIC-ADD ON: Bacteria, UA: NONE SEEN

## 2016-05-28 LAB — POCT PREGNANCY, URINE: PREG TEST UR: NEGATIVE

## 2016-05-28 MED ORDER — INSULIN REGULAR HUMAN 100 UNIT/ML IJ SOLN
5.0000 [IU] | Freq: Once | INTRAMUSCULAR | Status: AC
  Administered 2016-05-28: 5 [IU] via SUBCUTANEOUS
  Filled 2016-05-28: qty 1

## 2016-05-28 MED ORDER — SODIUM CHLORIDE 0.9 % IV BOLUS (SEPSIS)
1000.0000 mL | Freq: Once | INTRAVENOUS | Status: AC
  Administered 2016-05-28: 1000 mL via INTRAVENOUS

## 2016-05-28 MED ORDER — DEXTROSE 5 % IN LACTATED RINGERS IV BOLUS
1000.0000 mL | Freq: Once | INTRAVENOUS | Status: AC
  Administered 2016-05-28: 1000 mL via INTRAVENOUS

## 2016-05-28 MED ORDER — MORPHINE SULFATE (PF) 4 MG/ML IV SOLN
4.0000 mg | Freq: Once | INTRAVENOUS | Status: AC
  Administered 2016-05-28: 4 mg via INTRAVENOUS
  Filled 2016-05-28: qty 1

## 2016-05-28 MED ORDER — ONDANSETRON HCL 4 MG/2ML IJ SOLN
4.0000 mg | Freq: Once | INTRAMUSCULAR | Status: AC
  Administered 2016-05-28: 4 mg via INTRAVENOUS
  Filled 2016-05-28: qty 2

## 2016-05-28 MED ORDER — KETOROLAC TROMETHAMINE 60 MG/2ML IM SOLN
60.0000 mg | Freq: Once | INTRAMUSCULAR | Status: AC
  Administered 2016-05-28: 60 mg via INTRAMUSCULAR
  Filled 2016-05-28: qty 2

## 2016-05-28 MED ORDER — METRONIDAZOLE 500 MG PO TABS
2000.0000 mg | ORAL_TABLET | Freq: Once | ORAL | Status: AC
  Administered 2016-05-28: 2000 mg via ORAL
  Filled 2016-05-28: qty 4

## 2016-05-28 MED ORDER — LACTATED RINGERS IV BOLUS (SEPSIS): 1000.0000 mL | Freq: Once | INTRAVENOUS | Status: DC

## 2016-05-28 NOTE — ED Notes (Signed)
Patient went to [facility] for vaginal bleeding.    Patient was seen, by she was not having her blood sugar under control.  Patient was given 2 L of normal saline and multiple doses of insulin to bring her sugar down.  Patient continues with abdominal pain, was given toradol which did help her pain.  She now has 10/10 at this time.  Generalized abdominal, no nausea, no vomiting.  CBG at 1935 at [facility] was 305.  She had a total of 10 units of insulin.

## 2016-05-28 NOTE — MAU Provider Note (Signed)
History     CSN: [id number]  Arrival date and time: 05/28/16 1531   First Provider Initiated Contact with Patient 05/28/16 1631      Chief Complaint  Patient presents with  . Abdominal Pain  . Vaginal Bleeding   HPI   [name] is a 20 y.o. Non-pregnant female G0P0000 here in MAU with abdominal pain and vaginal bleeding. The abdominal pain started on Thursday. The pain is located in the her lower abdomen. She took tylenol last night " I took one Pill". And that did not help the pain. The bleeding started on Thursday, this is abnormal because she just had her period 2 weeks ago. The bleeding lasted Thursday-Saturday. No bleeding currently.   She is currently using the nuva ring; she has been using this method for a couple of years.   She has a history of diabetes, does not know what type. Takes insulin "when my sugar spikes". Has not checked her BS today, last time she checked it was last Monday.  Does not take her DM medication as prescribed. Since she has not felt well in days she has not taken her metformin.   Decreased appetite in the last 48 hours.   OB History    Gravida Para Term Preterm AB Living   0 0 0 0 0 0   SAB TAB Ectopic Multiple Live Births   0 0 0 0        Past Medical History:  Diagnosis Date  . Asthma   . Diabetes mellitus   . Genital herpes   . Hypercholesteremia   . Hypertension   . PCOS (polycystic ovarian syndrome)     Past Surgical History:  Procedure Laterality Date  . abscess removal     . TONSILLECTOMY    . TONSILLECTOMY AND ADENOIDECTOMY     tonsils only    Family History  Problem Relation Age of Onset  . Hypertension Mother   . Diabetes Mother     gestational  . Hypertension Maternal Grandmother   . Hypertension Maternal Grandfather   . Hypertension Paternal Grandmother   . Diabetes Paternal Grandmother     Social History  Substance Use Topics  . Smoking status: Former Smoker    Types: Cigarettes    Quit date:  05/28/2014  . Smokeless tobacco: Never Used  . Alcohol use No    Allergies: No Known Allergies  Prescriptions Prior to Admission  Medication Sig Dispense Refill Last Dose  . albuterol (PROVENTIL HFA;VENTOLIN HFA) 108 (90 BASE) MCG/ACT inhaler Inhale 2 puffs into the lungs every 6 (six) hours as needed for wheezing or shortness of breath.   Past Month at Unknown time  . beclomethasone (QVAR) 80 MCG/ACT inhaler Inhale 2 puffs into the lungs 2 (two) times daily as needed (for shortness of breath/wheezing).    Past Month at Unknown time  . cetirizine (ZYRTEC) 10 MG tablet Take 10 mg by mouth at bedtime.    Past Week at Unknown time  . cyclobenzaprine (FLEXERIL) 10 MG tablet Take 1 tablet (10 mg total) by mouth 2 (two) times daily as needed for muscle spasms. 20 tablet 0 Past Week at Unknown time  . glipiZIDE (GLUCOTROL XL) 10 MG 24 hr tablet Take 10 mg by mouth daily.  0 Past Week at Unknown time  . hydrochlorothiazide (HYDRODIURIL) 25 MG tablet Take 1 tablet (25 mg total) by mouth daily. 30 tablet 2 Past Week at Unknown time  . LANTUS SOLOSTAR 100 UNIT/ML Solostar Pen Inject  20 Units into the skin daily.   0 Past Week at Unknown time  . metFORMIN (GLUCOPHAGE) 500 MG tablet Take 1 tablet (500 mg total) by mouth 2 (two) times daily with a meal. 60 tablet 2 Past Week at Unknown time  . naproxen (NAPROSYN) 500 MG tablet Take 1 tablet (500 mg total) by mouth 2 (two) times daily with a meal. 30 tablet 0 Past Month at Unknown time  . polyethylene glycol (MIRALAX / GLYCOLAX) packet Take 17 g by mouth daily as needed for mild constipation.   Past Month at Unknown time  . simvastatin (ZOCOR) 10 MG tablet Take 1 tablet (10 mg total) by mouth every other day. 30 tablet 2 Past Month at Unknown time  . traZODone (DESYREL) 50 MG tablet Take 50 mg by mouth at bedtime.   05/27/2016 at Unknown time  . ACCU-CHEK FASTCLIX LANCETS MISC Use as directed for blood glucose checks three times daily (Patient taking  differently: 4 (four) times a week. Use as directed for blood glucose checks three times daily) 102 each 3 Taking  . Blood Glucose Monitoring Suppl (ACCU-CHEK NANO SMARTVIEW) W/DEVICE KIT 1 Device by Does not apply route 3 (three) times daily. (Patient taking differently: 1 Device by Does not apply route 4 (four) times a week. ) 1 kit 0 Taking  . glucose blood (ACCU-CHEK SMARTVIEW) test strip Use as directed for blood glucose checks 3 times daily. (Patient taking differently: 4 (four) times a week. Use as directed for blood glucose checks 3 times daily.) 100 each 3 Taking  . Insulin Pen Needle (INSUPEN PEN NEEDLES) 32G X 4 MM MISC Use as directed 50 each 3 Taking  . NUVARING 0.12-0.015 MG/24HR vaginal ring insert 1 ring vaginally for 3 weeks REMOVE for 1 week and repeat 1 each 11    Results for orders placed or performed during the hospital encounter of 05/28/16 (from the past 48 hour(s))  Urinalysis, Routine w reflex microscopic (not at [facility])     Status: Abnormal   Collection Time: 05/28/16  3:40 PM  Result Value Ref Range   Color, Urine YELLOW YELLOW   APPearance CLEAR CLEAR   Specific Gravity, Urine 1.010 1.005 - 1.030   pH 6.5 5.0 - 8.0   Glucose, UA >1000 (A) NEGATIVE mg/dL   Hgb urine dipstick MODERATE (A) NEGATIVE   Bilirubin Urine NEGATIVE NEGATIVE   Ketones, ur NEGATIVE NEGATIVE mg/dL   Protein, ur NEGATIVE NEGATIVE mg/dL   Nitrite NEGATIVE NEGATIVE   Leukocytes, UA NEGATIVE NEGATIVE  Urine microscopic-add on     Status: Abnormal   Collection Time: 05/28/16  3:40 PM  Result Value Ref Range   Squamous Epithelial / LPF 0-5 (A) NONE SEEN   WBC, UA 0-5 0 - 5 WBC/hpf   RBC / HPF 0-5 0 - 5 RBC/hpf   Bacteria, UA NONE SEEN NONE SEEN  Pregnancy, urine POC     Status: None   Collection Time: 05/28/16  3:50 PM  Result Value Ref Range   Preg Test, Ur NEGATIVE NEGATIVE    Comment:        THE SENSITIVITY OF THIS METHODOLOGY IS >24 mIU/mL   Glucose, capillary     Status: Abnormal    Collection Time: 05/28/16  4:43 PM  Result Value Ref Range   Glucose-Capillary 393 (H) 65 - 99 mg/dL  CBC     Status: Abnormal   Collection Time: 05/28/16  4:55 PM  Result Value Ref Range   WBC 9.1 4.0 -  10.5 K/uL   RBC 5.15 (H) 3.87 - 5.11 MIL/uL   Hemoglobin 13.5 12.0 - 15.0 g/dL   HCT 38.7 36.0 - 46.0 %   MCV 75.1 (L) 78.0 - 100.0 fL   MCH 26.2 26.0 - 34.0 pg   MCHC 34.9 30.0 - 36.0 g/dL   RDW 12.8 11.5 - 15.5 %   Platelets 268 150 - 400 K/uL  Comprehensive metabolic panel     Status: Abnormal   Collection Time: 05/28/16  4:55 PM  Result Value Ref Range   Sodium 134 (L) 135 - 145 mmol/L   Potassium 3.8 3.5 - 5.1 mmol/L   Chloride 100 (L) 101 - 111 mmol/L   CO2 26 22 - 32 mmol/L   Glucose, Bld 424 (H) 65 - 99 mg/dL   BUN 6 6 - 20 mg/dL   Creatinine, Ser 0.59 0.44 - 1.00 mg/dL   Calcium 9.5 8.9 - 10.3 mg/dL   Total Protein 7.1 6.5 - 8.1 g/dL   Albumin 3.6 3.5 - 5.0 g/dL   AST 14 (L) 15 - 41 U/L   ALT 12 (L) 14 - 54 U/L   Alkaline Phosphatase 52 38 - 126 U/L   Total Bilirubin 0.7 0.3 - 1.2 mg/dL   GFR calc non Af Amer >60 >60 mL/min   GFR calc Af Amer >60 >60 mL/min    Comment: (NOTE) The eGFR has been calculated using the CKD EPI equation. This calculation has not been validated in all clinical situations. eGFR's persistently <60 mL/min signify possible Chronic Kidney Disease.    Anion gap 8 5 - 15  Wet prep, genital     Status: Abnormal   Collection Time: 05/28/16  5:10 PM  Result Value Ref Range   Yeast Wet Prep HPF POC NONE SEEN NONE SEEN   Trich, Wet Prep PRESENT (A) NONE SEEN   Clue Cells Wet Prep HPF POC PRESENT (A) NONE SEEN   WBC, Wet Prep HPF POC FEW (A) NONE SEEN    Comment: MANY BACTERIA SEEN   Sperm NONE SEEN   Glucose, capillary     Status: Abnormal   Collection Time: 05/28/16  6:05 PM  Result Value Ref Range   Glucose-Capillary 361 (H) 65 - 99 mg/dL    Review of Systems  Constitutional: Negative for chills and fever.  Gastrointestinal: Positive  for abdominal pain and nausea. Negative for vomiting.  Genitourinary: Negative for dysuria and urgency.   Physical Exam   Blood pressure 128/82, pulse 87, temperature 98.1 F (36.7 C), temperature source Oral, resp. rate 16, height '5\' 8"'$  (1.727 m), weight 165 lb (74.8 kg), last menstrual period 05/10/2016, SpO2 100 %.  Physical Exam  Constitutional: She is oriented to person, place, and time. She appears well-developed and well-nourished. No distress.  GI: Soft. She exhibits no distension and no mass. There is no tenderness. There is no rebound and no guarding.  Genitourinary:  Genitourinary Comments: Wet prep and GC collected without speculum. Pink discharge noted on swabs  Bimanual exam: Cervix closed, no CMT  Uterus non tender, normal size Some left adnexal tenderness, no masses bilaterally GC/Chlam, wet prep done Chaperone present for exam.   Musculoskeletal: Normal range of motion.  Neurological: She is alert and oriented to person, place, and time.  Skin: Skin is warm. She is not diaphoretic.  Psychiatric: Her behavior is normal.    MAU Course  Procedures  None  MDM CBC & CMP  UA with >1000 glucose CBG: 393 NS bolus X  2 Regular insulin 5 units Sub Q Toradol 60 mg IM For pain.   Repeat Regular insulin 5 units Sub Q Discussed patient with Dr. Elly Modena agreeable to transfer Pain has improved following Toradol> patient is resting/sleeping in the bed.  Flagyl 2000 mg PO for trichomonas  A1C- pending    Assessment and Plan   A:  1. Hyperglycemia   2. Nausea and vomiting due to hyperglycemia   3. Trichomonas infection     P:  Transfer to [facility] for further evaluation  Dr. Ellender Hose accepting ED provider   Lezlie Lye, NP 05/28/2016 6:38 PM

## 2016-05-28 NOTE — ED Provider Notes (Signed)
[facility] DEPT Provider Note   CSN: [id number] Arrival date & time: 05/28/16  1531     History   Chief Complaint Chief Complaint  Patient presents with  . Abdominal Pain  . Vaginal Bleeding    HPI Tamara Reyes is a 20 y.o. female.  HPI 20 year old female with past medical history polycystic ovarian syndrome who presents with hyperglycemia. The patient first presented earlier today to [facility] with report of high blood sugar and pelvic and left adnexal pain. She underwent a pelvic exam which showed no cervical motion tenderness but was positive for trichomonas for which she received 2 g of Flagyl. However, she was noted to have severe hyperglycemia at that time with persistent blood sugars in the 400s. She was given 2 L of fluid, of which one was in D5 with normal saline. Her sugars remained elevated and she was subsequent transfer for evaluation. Currently, she denies any complaints beyond her mild, improving, aching, left lower quadrant and left adnexal pain. Denies any cervical motion pain. Denies any fevers or chills. She has had symptoms small amount of vaginal bleeding and discharge.  Past Medical History:  Diagnosis Date  . Asthma   . Diabetes mellitus   . Genital herpes   . Hypercholesteremia   . Hypertension   . PCOS (polycystic ovarian syndrome)     Patient Active Problem List   Diagnosis Date Noted  . Vaginitis and vulvovaginitis, unspecified 05/07/2014  . Hyperglycemia 01/07/2014  . Nexplanon insertion 08/26/2013  . Type 2 diabetes mellitus in patient age 105-19 years with HbA1C goal below 7.5 10/19/2011  . Acanthosis 10/19/2011  . Hyperlipidemia 10/19/2011  . Hypertension 10/19/2011  . Obesity 10/19/2011    Past Surgical History:  Procedure Laterality Date  . abscess removal     . TONSILLECTOMY    . TONSILLECTOMY AND ADENOIDECTOMY     tonsils only    OB History    Gravida Para Term Preterm AB Living   0 0 0 0 0 0   SAB TAB Ectopic Multiple  Live Births   0 0 0 0         Home Medications    Prior to Admission medications   Medication Sig Start Date End Date Taking? Authorizing Provider  albuterol (PROVENTIL HFA;VENTOLIN HFA) 108 (90 BASE) MCG/ACT inhaler Inhale 2 puffs into the lungs every 6 (six) hours as needed for wheezing or shortness of breath.   Yes Historical Provider, MD  beclomethasone (QVAR) 80 MCG/ACT inhaler Inhale 2 puffs into the lungs 2 (two) times daily as needed (for shortness of breath/wheezing).    Yes Historical Provider, MD  cetirizine (ZYRTEC) 10 MG tablet Take 10 mg by mouth at bedtime.    Yes Historical Provider, MD  cyclobenzaprine (FLEXERIL) 10 MG tablet Take 1 tablet (10 mg total) by mouth 2 (two) times daily as needed for muscle spasms. 05/01/16  Yes Abigail Harris, PA-C  glipiZIDE (GLUCOTROL XL) 10 MG 24 hr tablet Take 10 mg by mouth daily. 03/06/16  Yes Historical Provider, MD  hydrochlorothiazide (HYDRODIURIL) 25 MG tablet Take 1 tablet (25 mg total) by mouth daily. 11/24/14  Yes Manya Silvas, CNM  LANTUS SOLOSTAR 100 UNIT/ML Solostar Pen Inject 20 Units into the skin daily.  09/29/15  Yes Historical Provider, MD  metFORMIN (GLUCOPHAGE) 500 MG tablet Take 1 tablet (500 mg total) by mouth 2 (two) times daily with a meal. 11/24/14  Yes Manya Silvas, CNM  naproxen (NAPROSYN) 500 MG tablet Take 1 tablet (500 mg  total) by mouth 2 (two) times daily with a meal. 05/01/16  Yes Margarita Mail, PA-C  polyethylene glycol (MIRALAX / GLYCOLAX) packet Take 17 g by mouth daily as needed for mild constipation.   Yes Historical Provider, MD  simvastatin (ZOCOR) 10 MG tablet Take 1 tablet (10 mg total) by mouth every other day. 11/24/14  Yes Manya Silvas, CNM  traZODone (DESYREL) 50 MG tablet Take 50 mg by mouth at bedtime.   Yes Historical Provider, MD  ACCU-CHEK FASTCLIX LANCETS MISC Use as directed for blood glucose checks three times daily Patient taking differently: 4 (four) times a week. Use as directed for blood  glucose checks three times daily 11/24/14   [place], CNM  Blood Glucose Monitoring Suppl (ACCU-CHEK NANO SMARTVIEW) W/DEVICE KIT 1 Device by Does not apply route 3 (three) times daily. Patient taking differently: 1 Device by Does not apply route 4 (four) times a week.  01/09/14   Frazier Richards, MD  glucose blood (ACCU-CHEK SMARTVIEW) test strip Use as directed for blood glucose checks 3 times daily. Patient taking differently: 4 (four) times a week. Use as directed for blood glucose checks 3 times daily. 11/24/14   Manya Silvas, CNM  Insulin Pen Needle (INSUPEN PEN NEEDLES) 32G X 4 MM MISC Use as directed 01/09/14   Frazier Richards, MD  NUVARING 0.12-0.015 MG/24HR vaginal ring insert 1 ring vaginally for 3 weeks REMOVE for 1 week and repeat 03/06/16   Shelly Bombard, MD    Family History Family History  Problem Relation Age of Onset  . Hypertension Mother   . Diabetes Mother     gestational  . Hypertension Maternal Grandmother   . Hypertension Maternal Grandfather   . Hypertension Paternal Grandmother   . Diabetes Paternal Grandmother     Social History Social History  Substance Use Topics  . Smoking status: Former Smoker    Types: Cigarettes    Quit date: 05/28/2014  . Smokeless tobacco: Never Used  . Alcohol use No     Allergies   Review of patient's allergies indicates no known allergies.   Review of Systems Review of Systems  Constitutional: Positive for fatigue. Negative for chills and fever.  HENT: Negative for congestion, rhinorrhea and sore throat.   Eyes: Negative for visual disturbance.  Respiratory: Negative for cough, shortness of breath and wheezing.   Cardiovascular: Negative for chest pain and leg swelling.  Gastrointestinal: Positive for abdominal pain. Negative for diarrhea, nausea and vomiting.  Genitourinary: Positive for pelvic pain. Negative for dysuria, flank pain, vaginal bleeding and vaginal discharge.  Musculoskeletal: Negative for neck pain.    Skin: Negative for rash.  Allergic/Immunologic: Negative for immunocompromised state.  Neurological: Negative for syncope, weakness and headaches.  Hematological: Does not bruise/bleed easily.  All other systems reviewed and are negative.    Physical Exam Updated Vital Signs BP 101/77   Pulse 63   Temp 97.9 F (36.6 C) (Oral)   Resp 16   Ht _0  (1.727 m)   Wt 165 lb (74.8 kg)   LMP 05/10/2016   SpO2 98%   BMI 25.09 kg/m   Physical Exam  Constitutional: She is oriented to person, place, and time. She appears well-developed and well-nourished. No distress.  HENT:  Head: Normocephalic and atraumatic.  Eyes: Conjunctivae are normal. Pupils are equal, round, and reactive to light.  Neck: Neck supple.  Cardiovascular: Normal rate, regular rhythm and normal heart sounds.  Exam reveals no friction rub.   No  murmur heard. Pulmonary/Chest: Effort normal and breath sounds normal. No respiratory distress. She has no wheezes. She has no rales.  Abdominal: Soft. Bowel sounds are normal. She exhibits no distension. There is no rigidity, no rebound and no guarding.  Moderate left adnexal tenderness.  Musculoskeletal: She exhibits no edema.  Neurological: She is alert and oriented to person, place, and time.  Skin: Skin is warm. Capillary refill takes less than 2 seconds.  Psychiatric: She has a normal mood and affect.  Nursing note and vitals reviewed.    ED Treatments / Results  Labs (all labs ordered are listed, but only abnormal results are displayed) Labs Reviewed  WET PREP, GENITAL - Abnormal; Notable for the following:       Result Value   Trich, Wet Prep PRESENT (*)    Clue Cells Wet Prep HPF POC PRESENT (*)    WBC, Wet Prep HPF POC FEW (*)    All other components within normal limits  URINALYSIS, ROUTINE W REFLEX MICROSCOPIC (NOT AT [facility]) - Abnormal; Notable for the following:    Glucose, UA >1000 (*)    Hgb urine dipstick MODERATE (*)    All other components within  normal limits  URINE MICROSCOPIC-ADD ON - Abnormal; Notable for the following:    Squamous Epithelial / LPF 0-5 (*)    All other components within normal limits  CBC - Abnormal; Notable for the following:    RBC 5.15 (*)    MCV 75.1 (*)    All other components within normal limits  COMPREHENSIVE METABOLIC PANEL - Abnormal; Notable for the following:    Sodium 134 (*)    Chloride 100 (*)    Glucose, Bld 424 (*)    AST 14 (*)    ALT 12 (*)    All other components within normal limits  GLUCOSE, CAPILLARY - Abnormal; Notable for the following:    Glucose-Capillary 393 (*)    All other components within normal limits  GLUCOSE, CAPILLARY - Abnormal; Notable for the following:    Glucose-Capillary 361 (*)    All other components within normal limits  GLUCOSE, CAPILLARY - Abnormal; Notable for the following:    Glucose-Capillary 305 (*)    All other components within normal limits  CBG MONITORING, ED - Abnormal; Notable for the following:    Glucose-Capillary 227 (*)    All other components within normal limits  CBG MONITORING, ED - Abnormal; Notable for the following:    Glucose-Capillary 187 (*)    All other components within normal limits  HEMOGLOBIN A1C  POCT PREGNANCY, URINE  I-STAT VENOUS BLOOD GAS, ED  GC/CHLAMYDIA PROBE AMP ([place]) NOT AT [facility]    EKG  EKG Interpretation None       Radiology US Transvaginal Non-ob  Result Date: 05/29/2016 CLINICAL DATA:  20 year old female with left adnexal pain. EXAM: TRANSABDOMINAL AND TRANSVAGINAL ULTRASOUND OF PELVIS DOPPLER ULTRASOUND OF OVARIES TECHNIQUE: Both transabdominal and transvaginal ultrasound examinations of the pelvis were performed. Transabdominal technique was performed for global imaging of the pelvis including uterus, ovaries, adnexal regions, and pelvic cul-de-sac. It was necessary to proceed with endovaginal exam following the transabdominal exam to visualize the endometrium and the ovaries. Color and duplex  Doppler ultrasound was utilized to evaluate blood flow to the ovaries. COMPARISON:  Ultrasound dated 05/08/2014 FINDINGS: Uterus Measurements:8.8 x 4.2 x 3.8 cm. No fibroids or other mass visualized. Endometrium Thickness: 6 mm.  No focal abnormality visualized. Right ovary Measurements: 3.7 x 2.3 x 2.4  cm. Normal appearance/no adnexal mass. Echogenic structure adjacent to the right ovary see represent bowel or ovarian tissue. A hematoma is less likely. Left ovary Measurements: 3.6 x 2.4 x 2.3 cm. Normal appearance/no adnexal mass. Pulsed Doppler evaluation of both ovaries demonstrates normal low-resistance arterial and venous waveforms. Other findings Small amount of fluid noted within the pelvis and surrounding both ovaries with low-level internal echoes. IMPRESSION: Unremarkable pelvic ultrasound. Electronically Signed   By: Anner Crete M.D.   On: 05/29/2016 00:28   US Pelvis Complete  Result Date: 05/29/2016 CLINICAL DATA:  20 year old female with left adnexal pain. EXAM: TRANSABDOMINAL AND TRANSVAGINAL ULTRASOUND OF PELVIS DOPPLER ULTRASOUND OF OVARIES TECHNIQUE: Both transabdominal and transvaginal ultrasound examinations of the pelvis were performed. Transabdominal technique was performed for global imaging of the pelvis including uterus, ovaries, adnexal regions, and pelvic cul-de-sac. It was necessary to proceed with endovaginal exam following the transabdominal exam to visualize the endometrium and the ovaries. Color and duplex Doppler ultrasound was utilized to evaluate blood flow to the ovaries. COMPARISON:  Ultrasound dated 05/08/2014 FINDINGS: Uterus Measurements:8.8 x 4.2 x 3.8 cm. No fibroids or other mass visualized. Endometrium Thickness: 6 mm.  No focal abnormality visualized. Right ovary Measurements: 3.7 x 2.3 x 2.4 cm. Normal appearance/no adnexal mass. Echogenic structure adjacent to the right ovary see represent bowel or ovarian tissue. A hematoma is less likely. Left ovary  Measurements: 3.6 x 2.4 x 2.3 cm. Normal appearance/no adnexal mass. Pulsed Doppler evaluation of both ovaries demonstrates normal low-resistance arterial and venous waveforms. Other findings Small amount of fluid noted within the pelvis and surrounding both ovaries with low-level internal echoes. IMPRESSION: Unremarkable pelvic ultrasound. Electronically Signed   By: Anner Crete M.D.   On: 05/29/2016 00:28   [place] Art/ven Flow Abd Pelv Doppler  Result Date: 05/29/2016 CLINICAL DATA:  20 year old female with left adnexal pain. EXAM: TRANSABDOMINAL AND TRANSVAGINAL ULTRASOUND OF PELVIS DOPPLER ULTRASOUND OF OVARIES TECHNIQUE: Both transabdominal and transvaginal ultrasound examinations of the pelvis were performed. Transabdominal technique was performed for global imaging of the pelvis including uterus, ovaries, adnexal regions, and pelvic cul-de-sac. It was necessary to proceed with endovaginal exam following the transabdominal exam to visualize the endometrium and the ovaries. Color and duplex Doppler ultrasound was utilized to evaluate blood flow to the ovaries. COMPARISON:  Ultrasound dated 05/08/2014 FINDINGS: Uterus Measurements:8.8 x 4.2 x 3.8 cm. No fibroids or other mass visualized. Endometrium Thickness: 6 mm.  No focal abnormality visualized. Right ovary Measurements: 3.7 x 2.3 x 2.4 cm. Normal appearance/no adnexal mass. Echogenic structure adjacent to the right ovary see represent bowel or ovarian tissue. A hematoma is less likely. Left ovary Measurements: 3.6 x 2.4 x 2.3 cm. Normal appearance/no adnexal mass. Pulsed Doppler evaluation of both ovaries demonstrates normal low-resistance arterial and venous waveforms. Other findings Small amount of fluid noted within the pelvis and surrounding both ovaries with low-level internal echoes. IMPRESSION: Unremarkable pelvic ultrasound. Electronically Signed   By: Anner Crete M.D.   On: 05/29/2016 00:28    Procedures Procedures (including  critical care time)  Medications Ordered in ED Medications  ketorolac (TORADOL) injection 60 mg (60 mg Intramuscular Given 05/28/16 1701)  insulin regular (NOVOLIN R,HUMULIN R) 100 units/mL injection 5 Units (5 Units Subcutaneous Given 05/28/16 1718)  dextrose 5% lactated ringers bolus 1,000 mL (0 mLs Intravenous Hold 05/28/16 1702)  sodium chloride 0.9 % bolus 1,000 mL (1,000 mLs Intravenous Given 05/28/16 1706)  sodium chloride 0.9 % bolus 1,000 mL (1,000 mLs Intravenous Given  05/28/16 1730)  metroNIDAZOLE (FLAGYL) tablet 2,000 mg (2,000 mg Oral Given 05/28/16 1807)  insulin regular (NOVOLIN R,HUMULIN R) 100 units/mL injection 5 Units (5 Units Subcutaneous Given 05/28/16 1824)  morphine 4 MG/ML injection 4 mg (4 mg Intravenous Given 05/28/16 2115)  ondansetron (ZOFRAN) injection 4 mg (4 mg Intravenous Given 05/28/16 2115)  cefTRIAXone (ROCEPHIN) injection 250 mg (250 mg Intramuscular Given 05/29/16 0103)  azithromycin (ZITHROMAX) tablet 1,000 mg (1,000 mg Oral Given 05/29/16 0103)  lidocaine (PF) (XYLOCAINE) 1 % injection 5 mL (5 mLs Infiltration Given 05/29/16 0103)     Initial Impression / Assessment and Plan / ED Course  I have reviewed the triage vital signs and the nursing notes.  Pertinent labs & imaging results that were available during my care of the patient were reviewed by me and considered in my medical decision making (see chart for details).  Clinical Course    20 year old female with past medical history of polycystic ovarian syndrome who presents for evaluation of hyperglycemia. Patient was initially seen at [facility] and treated for positive Trichomonas. Labwork was obtained which showed hyperglycemia to the 400s but normal anion gap and bicarbonate. She was given 2 L of fluid without improvement and sent here. Currently, blood sugar is in the 300s. She appears clinically well appearing and in no acute distress. Labwork is otherwise unremarkable. Pregnancy was  negative. No fevers, chills, or signs systemic illness. UA shows no evidence of UTI. Given her significant adnexal tenderness, will obtain ultrasound for further assessment and continue IV fluids.   Ultrasound shows no abnormalities. Blood gas is acceptable and blood sugar is now less than 200. She has no evidence of DKA with normal anion gap on repeat labs. She is tolerating by mouth without difficulty. I advised her that she needs to follow up closely with her primary doctor for management of her hyperglycemia as well as discuss a sex precautions. We will also treat empirically for gonococcus and chlamydia given positive Trichomonas. Will discharge home  Final Clinical Impressions(s) / ED Diagnoses   Final diagnoses:  Hyperglycemia  Nausea and vomiting due to hyperglycemia  Trichomonas infection  Lower abdominal pain    New Prescriptions Discharge Medication List as of 05/29/2016 12:42 AM       Duffy Bruce, MD 05/29/16 0151

## 2016-05-28 NOTE — MAU Note (Signed)
Patient presents to MAU with abnormal vaginal bleeding.  She states her LMP was 05/10/16.  She had a Nuva Ring in place after that, but took it out on Thursday 05/25/16 when she started bleeding again.  Also c/o abdominal cramping that she rates a 9/10 that doesn't go away after taking tylenol or ibuprofen.  Today she is having light spotting, but was concerned about the bleeding and wanted to be seen.

## 2016-05-28 NOTE — ED Notes (Signed)
Pt to US.

## 2016-05-28 NOTE — ED Notes (Signed)
Pt's CBG: 187, informed Tina-RN.

## 2016-05-29 LAB — GC/CHLAMYDIA PROBE AMP (~~LOC~~) NOT AT ARMC
CHLAMYDIA, DNA PROBE: NEGATIVE
NEISSERIA GONORRHEA: NEGATIVE

## 2016-05-29 LAB — HEMOGLOBIN A1C
HEMOGLOBIN A1C: 15 % — AB (ref 4.8–5.6)
Mean Plasma Glucose: 384 mg/dL

## 2016-05-29 MED ORDER — LIDOCAINE HCL (PF) 1 % IJ SOLN
5.0000 mL | Freq: Once | INTRAMUSCULAR | Status: AC
  Administered 2016-05-29: 5 mL
  Filled 2016-05-29: qty 5

## 2016-05-29 MED ORDER — CEFTRIAXONE SODIUM 250 MG IJ SOLR: 250.0000 mg | Freq: Once | INTRAMUSCULAR | Status: DC

## 2016-05-29 MED ORDER — CEFTRIAXONE SODIUM 250 MG IJ SOLR
250.0000 mg | Freq: Once | INTRAMUSCULAR | Status: AC
  Administered 2016-05-29: 250 mg via INTRAMUSCULAR
  Filled 2016-05-29: qty 250

## 2016-05-29 MED ORDER — AZITHROMYCIN 250 MG PO TABS
1000.0000 mg | ORAL_TABLET | Freq: Once | ORAL | Status: AC
  Administered 2016-05-29: 1000 mg via ORAL
  Filled 2016-05-29: qty 4

## 2016-05-29 NOTE — ED Notes (Signed)
Pt given food

## 2016-06-09 ENCOUNTER — Emergency Department (HOSPITAL_COMMUNITY)
Admission: EM | Admit: 2016-06-09 | Discharge: 2016-06-10 | Disposition: A | Discharge status: Home / Self Care | Payer: Medicaid Other | Attending: Emergency Medicine | Admitting: Emergency Medicine

## 2016-06-09 ENCOUNTER — Encounter (HOSPITAL_COMMUNITY): Payer: Self-pay

## 2016-06-09 DIAGNOSIS — Z794 Long term (current) use of insulin: Secondary | ICD-10-CM | POA: Diagnosis not present

## 2016-06-09 DIAGNOSIS — Z87891 Personal history of nicotine dependence: Secondary | ICD-10-CM | POA: Insufficient documentation

## 2016-06-09 DIAGNOSIS — E119 Type 2 diabetes mellitus without complications: Secondary | ICD-10-CM | POA: Insufficient documentation

## 2016-06-09 DIAGNOSIS — Z7984 Long term (current) use of oral hypoglycemic drugs: Secondary | ICD-10-CM | POA: Insufficient documentation

## 2016-06-09 DIAGNOSIS — L02415 Cutaneous abscess of right lower limb: Secondary | ICD-10-CM | POA: Diagnosis not present

## 2016-06-09 DIAGNOSIS — J45909 Unspecified asthma, uncomplicated: Secondary | ICD-10-CM | POA: Diagnosis not present

## 2016-06-09 DIAGNOSIS — I10 Essential (primary) hypertension: Secondary | ICD-10-CM | POA: Diagnosis not present

## 2016-06-09 DIAGNOSIS — L0291 Cutaneous abscess, unspecified: Secondary | ICD-10-CM

## 2016-06-09 MED ORDER — CLINDAMYCIN HCL 150 MG PO CAPS
300.0000 mg | ORAL_CAPSULE | Freq: Once | ORAL | Status: AC
  Administered 2016-06-10: 300 mg via ORAL
  Filled 2016-06-09: qty 2

## 2016-06-09 MED ORDER — HYDROCODONE-ACETAMINOPHEN 5-325 MG PO TABS
1.0000 | ORAL_TABLET | Freq: Once | ORAL | Status: AC
  Administered 2016-06-10: 1 via ORAL
  Filled 2016-06-09: qty 1

## 2016-06-09 MED ORDER — CLINDAMYCIN HCL 150 MG PO CAPS: 150.0000 mg | ORAL_CAPSULE | Freq: Four times a day (QID) | ORAL | 0 refills | Status: DC

## 2016-06-09 MED ORDER — LIDOCAINE HCL 2 % IJ SOLN
10.0000 mL | Freq: Once | INTRAMUSCULAR | Status: AC
  Administered 2016-06-09: 200 mg
  Filled 2016-06-09: qty 20

## 2016-06-09 MED ORDER — HYDROCODONE-ACETAMINOPHEN 5-325 MG PO TABS: 1.0000 | ORAL_TABLET | Freq: Four times a day (QID) | ORAL | 0 refills | Status: DC | PRN

## 2016-06-09 NOTE — ED Notes (Signed)
Pt did not answer when called for triage x1 

## 2016-06-09 NOTE — ED Provider Notes (Signed)
[facility]-EMERGENCY DEPT Provider Note   CSN: [id number] Arrival date & time: 06/09/16  1757     History   Chief Complaint Chief Complaint  Patient presents with  . Insect Bite    HPI Tamara Reyes is a 20 y.o. female.  The history is provided by the patient.  Abscess  Location:  Leg Leg abscess location:  R lower leg Size:  Quarter sized Abscess quality: fluctuance, induration, painful and redness   Abscess quality: not draining   Red streaking: no   Duration:  2 days Progression:  Worsening Pain details:    Quality:  Tightness, sharp and shooting   Severity:  Moderate   Timing:  Constant   Progression:  Worsening Chronicity:  Recurrent Context: diabetes and skin injury   Context: not injected drug use   Relieved by:  Nothing Worsened by:  Draining/squeezing Ineffective treatments:  Draining/squeezing Associated symptoms: no fever, no nausea and no vomiting   Risk factors: prior abscess   Risk factors: no hx of MRSA     Past Medical History:  Diagnosis Date  . Asthma   . Diabetes mellitus   . Genital herpes   . Hypercholesteremia   . Hypertension   . PCOS (polycystic ovarian syndrome)     Patient Active Problem List   Diagnosis Date Noted  . Vaginitis and vulvovaginitis, unspecified 05/07/2014  . Hyperglycemia 01/07/2014  . Nexplanon insertion 08/26/2013  . Type 2 diabetes mellitus in patient age 32-19 years with HbA1C goal below 7.5 10/19/2011  . Acanthosis 10/19/2011  . Hyperlipidemia 10/19/2011  . Hypertension 10/19/2011  . Obesity 10/19/2011    Past Surgical History:  Procedure Laterality Date  . abscess removal     . TONSILLECTOMY    . TONSILLECTOMY AND ADENOIDECTOMY     tonsils only    OB History    Gravida Para Term Preterm AB Living   0 0 0 0 0 0   SAB TAB Ectopic Multiple Live Births   0 0 0 0         Home Medications    Prior to Admission medications   Medication Sig Start Date End Date Taking? Authorizing Provider  albuterol  (PROVENTIL HFA;VENTOLIN HFA) 108 (90 BASE) MCG/ACT inhaler Inhale 2 puffs into the lungs every 6 (six) hours as needed for wheezing or shortness of breath.   Yes Historical Provider, MD  beclomethasone (QVAR) 80 MCG/ACT inhaler Inhale 2 puffs into the lungs 2 (two) times daily as needed (for shortness of breath/wheezing).    Yes Historical Provider, MD  cetirizine (ZYRTEC) 10 MG tablet Take 10 mg by mouth at bedtime.    Yes Historical Provider, MD  cyclobenzaprine (FLEXERIL) 10 MG tablet Take 1 tablet (10 mg total) by mouth 2 (two) times daily as needed for muscle spasms. 05/01/16  Yes Abigail Harris, PA-C  glipiZIDE (GLUCOTROL XL) 10 MG 24 hr tablet Take 10 mg by mouth daily. 03/06/16  Yes Historical Provider, MD  hydrochlorothiazide (HYDRODIURIL) 25 MG tablet Take 1 tablet (25 mg total) by mouth daily. 11/24/14  Yes Dorathy Kinsman, CNM  LANTUS SOLOSTAR 100 UNIT/ML Solostar Pen Inject 20 Units into the skin daily.  09/29/15  Yes Historical Provider, MD  metFORMIN (GLUCOPHAGE) 500 MG tablet Take 1 tablet (500 mg total) by mouth 2 (two) times daily with a meal. 11/24/14  Yes Dorathy Kinsman, CNM  naproxen (NAPROSYN) 500 MG tablet Take 1 tablet (500 mg total) by mouth 2 (two) times daily with a meal. Patient taking differently: Take  500 mg by mouth 2 (two) times daily as needed.  05/01/16  Yes Margarita Mail, PA-C  NUVARING 0.12-0.015 MG/24HR vaginal ring insert 1 ring vaginally for 3 weeks REMOVE for 1 week and repeat 03/06/16  Yes Shelly Bombard, MD  polyethylene glycol (MIRALAX / GLYCOLAX) packet Take 17 g by mouth daily as needed for mild constipation.   Yes Historical Provider, MD  simvastatin (ZOCOR) 10 MG tablet Take 1 tablet (10 mg total) by mouth every other day. 11/24/14  Yes Manya Silvas, CNM  traZODone (DESYREL) 50 MG tablet Take 50 mg by mouth at bedtime.   Yes Historical Provider, MD  ACCU-CHEK FASTCLIX LANCETS MISC Use as directed for blood glucose checks three times daily Patient taking  differently: 4 (four) times a week. Use as directed for blood glucose checks three times daily 11/24/14   [place], CNM  Blood Glucose Monitoring Suppl (ACCU-CHEK NANO SMARTVIEW) W/DEVICE KIT 1 Device by Does not apply route 3 (three) times daily. Patient taking differently: 1 Device by Does not apply route 4 (four) times a week.  01/09/14   Frazier Richards, MD  glucose blood (ACCU-CHEK SMARTVIEW) test strip Use as directed for blood glucose checks 3 times daily. Patient taking differently: 4 (four) times a week. Use as directed for blood glucose checks 3 times daily. 11/24/14   Manya Silvas, CNM  Insulin Pen Needle (INSUPEN PEN NEEDLES) 32G X 4 MM MISC Use as directed 01/09/14   Frazier Richards, MD    Family History Family History  Problem Relation Age of Onset  . Hypertension Mother   . Diabetes Mother     gestational  . Hypertension Maternal Grandmother   . Hypertension Maternal Grandfather   . Hypertension Paternal Grandmother   . Diabetes Paternal Grandmother     Social History Social History  Substance Use Topics  . Smoking status: Former Smoker    Types: Cigarettes    Quit date: 05/28/2014  . Smokeless tobacco: Never Used  . Alcohol use No     Allergies   Review of patient's allergies indicates no known allergies.   Review of Systems Review of Systems  Constitutional: Negative for fever.  Gastrointestinal: Negative for nausea and vomiting.  All other systems reviewed and are negative.    Physical Exam Updated Vital Signs BP 144/94   Pulse 99   Temp 98.5 F (36.9 C) (Oral)   Resp 17   Ht '5\' 8"'$  (1.727 m)   Wt 165 lb (74.8 kg)   LMP 05/10/2016   SpO2 100%   BMI 25.09 kg/m   Physical Exam  Constitutional: She is oriented to person, place, and time. She appears well-developed and well-nourished. No distress.  HENT:  Head: Normocephalic and atraumatic.  Eyes: EOM are normal. Pupils are equal, round, and reactive to light.  Cardiovascular: Normal rate.     Pulmonary/Chest: Effort normal.  Musculoskeletal: She exhibits tenderness.  Abscess present on the mid right shin with pointing but no drainage.  Surrounding erythema and fluctuance and induration.  No red streaking  Neurological: She is alert and oriented to person, place, and time.  Skin: Skin is warm. Capillary refill takes less than 2 seconds. There is erythema.  Psychiatric: She has a normal mood and affect. Her behavior is normal.  Nursing note and vitals reviewed.    ED Treatments / Results  Labs (all labs ordered are listed, but only abnormal results are displayed) Labs Reviewed - No data to display  EKG  EKG  Interpretation None       Radiology No results found.  Procedures Procedures (including critical care time)  Medications Ordered in ED Medications  HYDROcodone-acetaminophen (NORCO/VICODIN) 5-325 MG per tablet 1 tablet (not administered)  clindamycin (CLEOCIN) capsule 300 mg (not administered)  lidocaine (XYLOCAINE) 2 % (with pres) injection 200 mg (200 mg Infiltration Given 06/09/16 2235)     Initial Impression / Assessment and Plan / ED Course  I have reviewed the triage vital signs and the nursing notes.  Pertinent labs & imaging results that were available during my care of the patient were reviewed by me and considered in my medical decision making (see chart for details).  Clinical Course    Patient is a 20 year old female with a history of diabetes presenting today with a 2 day history of abscess to the right shin. She has no systemic symptoms at this time but was unable to get any pus out of the abscess and states worsening swelling of the leg. Patient has no signs of cellulitis going up the leg and states her blood sugars of been stable in the 200s. I&D as below and patient started on clindamycin. She was given strict return precautions  INCISION AND DRAINAGE Performed by: Blanchie Dessert Consent: Verbal consent obtained. Risks and benefits:  risks, benefits and alternatives were discussed Type: abscess  Body area: right shin  Anesthesia: local infiltration  Incision was made with a scalpel.  Local anesthetic: lidocaine 2% with epinephrine  Anesthetic total: 4 ml  Complexity: complex Blunt dissection to break up loculations  Drainage: purulent  Drainage amount: 62m  Packing material: none Patient tolerance: Patient tolerated the procedure well with no immediate complications.     Final Clinical Impressions(s) / ED Diagnoses   Final diagnoses:  Abscess    New Prescriptions New Prescriptions   CLINDAMYCIN (CLEOCIN) 150 MG CAPSULE    Take 1 capsule (150 mg total) by mouth every 6 (six) hours.   HYDROCODONE-ACETAMINOPHEN (NORCO/VICODIN) 5-325 MG TABLET    Take 1-2 tablets by mouth every 6 (six) hours as needed.     WBlanchie Dessert MD 06/09/16 2318

## 2016-06-09 NOTE — ED Triage Notes (Signed)
Per pT, Pt is coming from home with complaints of an insect bite that has become infected starting on Tuesday. Pain noted to the right lower leg with erythema and edema.
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
## 2016-10-11 ENCOUNTER — Emergency Department (HOSPITAL_COMMUNITY)
Admission: EM | Admit: 2016-10-11 | Discharge: 2016-10-11 | Disposition: A | Discharge status: Home / Self Care | Payer: Medicaid Other | Attending: Emergency Medicine | Admitting: Emergency Medicine

## 2016-10-11 ENCOUNTER — Encounter (HOSPITAL_COMMUNITY): Payer: Self-pay

## 2016-10-11 DIAGNOSIS — Z87891 Personal history of nicotine dependence: Secondary | ICD-10-CM | POA: Insufficient documentation

## 2016-10-11 DIAGNOSIS — I10 Essential (primary) hypertension: Secondary | ICD-10-CM | POA: Insufficient documentation

## 2016-10-11 DIAGNOSIS — J45909 Unspecified asthma, uncomplicated: Secondary | ICD-10-CM | POA: Insufficient documentation

## 2016-10-11 DIAGNOSIS — E119 Type 2 diabetes mellitus without complications: Secondary | ICD-10-CM | POA: Insufficient documentation

## 2016-10-11 DIAGNOSIS — R1013 Epigastric pain: Secondary | ICD-10-CM

## 2016-10-11 DIAGNOSIS — Z794 Long term (current) use of insulin: Secondary | ICD-10-CM | POA: Insufficient documentation

## 2016-10-11 LAB — COMPREHENSIVE METABOLIC PANEL
ALBUMIN: 3.8 g/dL (ref 3.5–5.0)
ALK PHOS: 53 U/L (ref 38–126)
ALT: 16 U/L (ref 14–54)
ANION GAP: 7 (ref 5–15)
AST: 12 U/L — AB (ref 15–41)
BUN: 7 mg/dL (ref 6–20)
CALCIUM: 8.9 mg/dL (ref 8.9–10.3)
CO2: 25 mmol/L (ref 22–32)
Chloride: 102 mmol/L (ref 101–111)
Creatinine, Ser: 0.55 mg/dL (ref 0.44–1.00)
GFR calc Af Amer: >60 mL/min (ref >60)
GFR calc non Af Amer: >60 mL/min (ref >60)
GLUCOSE: 351 mg/dL — AB (ref 65–99)
Potassium: 3.8 mmol/L (ref 3.5–5.1)
SODIUM: 134 mmol/L — AB (ref 135–145)
Total Bilirubin: 0.8 mg/dL (ref 0.3–1.2)
Total Protein: 7.2 g/dL (ref 6.5–8.1)

## 2016-10-11 LAB — URINALYSIS, ROUTINE W REFLEX MICROSCOPIC
BACTERIA UA: NONE SEEN
Bilirubin Urine: NEGATIVE
Glucose, UA: >=500 mg/dL — AB
Hgb urine dipstick: NEGATIVE
Ketones, ur: NEGATIVE mg/dL
Leukocytes, UA: NEGATIVE
Nitrite: NEGATIVE
PROTEIN: NEGATIVE mg/dL
Specific Gravity, Urine: 1.041 — ABNORMAL HIGH (ref 1.005–1.030)
pH: 6 (ref 5.0–8.0)

## 2016-10-11 LAB — CBC
HEMATOCRIT: 38.1 % (ref 36.0–46.0)
HEMOGLOBIN: 13 g/dL (ref 12.0–15.0)
MCH: 24.7 pg — AB (ref 26.0–34.0)
MCHC: 34.1 g/dL (ref 30.0–36.0)
MCV: 72.4 fL — ABNORMAL LOW (ref 78.0–100.0)
Platelets: 242 10*3/uL (ref 150–400)
RBC: 5.26 MIL/uL — ABNORMAL HIGH (ref 3.87–5.11)
RDW: 12.7 % (ref 11.5–15.5)
WBC: 9.2 10*3/uL (ref 4.0–10.5)

## 2016-10-11 LAB — LIPASE, BLOOD: Lipase: 23 U/L (ref 11–51)

## 2016-10-11 LAB — CBG MONITORING, ED: GLUCOSE-CAPILLARY: 237 mg/dL — AB (ref 65–99)

## 2016-10-11 MED ORDER — MORPHINE SULFATE (PF) 4 MG/ML IV SOLN
4.0000 mg | Freq: Once | INTRAVENOUS | Status: AC
  Administered 2016-10-11: 4 mg via INTRAVENOUS
  Filled 2016-10-11: qty 1

## 2016-10-11 MED ORDER — ONDANSETRON HCL 4 MG/2ML IJ SOLN
4.0000 mg | Freq: Once | INTRAMUSCULAR | Status: AC
  Administered 2016-10-11: 4 mg via INTRAVENOUS
  Filled 2016-10-11: qty 2

## 2016-10-11 MED ORDER — SODIUM CHLORIDE 0.9 % IV BOLUS (SEPSIS)
1000.0000 mL | Freq: Once | INTRAVENOUS | Status: AC
  Administered 2016-10-11: 1000 mL via INTRAVENOUS

## 2016-10-11 MED ORDER — GI COCKTAIL ~~LOC~~
30.0000 mL | Freq: Once | ORAL | Status: AC
  Administered 2016-10-11: 30 mL via ORAL
  Filled 2016-10-11: qty 30

## 2016-10-11 MED ORDER — DICYCLOMINE HCL 20 MG PO TABS: 20.0000 mg | ORAL_TABLET | Freq: Two times a day (BID) | ORAL | 0 refills | Status: DC

## 2016-10-11 MED ORDER — OMEPRAZOLE-SODIUM BICARBONATE 40-1100 MG PO CAPS: 1.0000 | ORAL_CAPSULE | Freq: Every day | ORAL | 0 refills | Status: DC

## 2016-10-11 MED ORDER — SUCRALFATE 1 G PO TABS: 1.0000 g | ORAL_TABLET | Freq: Three times a day (TID) | ORAL | 0 refills | Status: DC

## 2016-10-11 NOTE — ED Provider Notes (Signed)
[facility] DEPT Provider Note   CSN: [id number] Arrival date & time: 10/11/16  1800     History   Chief Complaint Chief Complaint  Patient presents with  . Abdominal Pain  . Nausea    HPI Shanautica Forker is a 21 y.o. female who presents the emergency department with a chief complaint of epigastric abdominal pain. She states it is been ongoing for 2 weeks. She's had associated gassiness and increased flatulence. She denies abnormal bowel movements. She states she usually makes bowel movements every 2-3 days and does not feel constipated. She denies any bloating. She's had some mild nausea without vomiting. She describes the pain as gnawing. It is not exacerbated by eating and is not postprandial. She denies any urinary symptoms, fevers, chills, chest pain, shortness of breath.  HPI  Past Medical History:  Diagnosis Date  . Asthma   . Diabetes mellitus   . Genital herpes   . Hypercholesteremia   . Hypertension   . PCOS (polycystic ovarian syndrome)     Patient Active Problem List   Diagnosis Date Noted  . Vaginitis and vulvovaginitis, unspecified 05/07/2014  . Hyperglycemia 01/07/2014  . Nexplanon insertion 08/26/2013  . Type 2 diabetes mellitus in patient age 65-19 years with HbA1C goal below 7.5 10/19/2011  . Acanthosis 10/19/2011  . Hyperlipidemia 10/19/2011  . Hypertension 10/19/2011  . Obesity 10/19/2011    Past Surgical History:  Procedure Laterality Date  . abscess removal     . TONSILLECTOMY    . TONSILLECTOMY AND ADENOIDECTOMY     tonsils only    OB History    Gravida Para Term Preterm AB Living   0 0 0 0 0 0   SAB TAB Ectopic Multiple Live Births   0 0 0 0         Home Medications    Prior to Admission medications   Medication Sig Start Date End Date Taking? Authorizing Provider  cetirizine (ZYRTEC) 10 MG tablet Take 10 mg by mouth at bedtime.    Yes Historical Provider, MD  cyclobenzaprine (FLEXERIL) 10 MG tablet Take 1 tablet (10 mg total) by  mouth 2 (two) times daily as needed for muscle spasms. 05/01/16  Yes Adin Laker, PA-C  glipiZIDE (GLUCOTROL XL) 10 MG 24 hr tablet Take 10 mg by mouth daily. 03/06/16  Yes Historical Provider, MD  hydrochlorothiazide (HYDRODIURIL) 25 MG tablet Take 1 tablet (25 mg total) by mouth daily. 11/24/14  Yes Manya Silvas, CNM  LANTUS SOLOSTAR 100 UNIT/ML Solostar Pen Inject 15 Units into the skin daily at 10 pm.  09/29/15  Yes Historical Provider, MD  metFORMIN (GLUCOPHAGE) 500 MG tablet Take 1 tablet (500 mg total) by mouth 2 (two) times daily with a meal. Patient taking differently: Take 500 mg by mouth daily with breakfast.  11/24/14  Yes Manya Silvas, CNM  simvastatin (ZOCOR) 10 MG tablet Take 1 tablet (10 mg total) by mouth every other day. 11/24/14  Yes Manya Silvas, CNM  traZODone (DESYREL) 50 MG tablet Take 50 mg by mouth at bedtime as needed for sleep.    Yes Historical Provider, MD  ACCU-CHEK FASTCLIX LANCETS MISC Use as directed for blood glucose checks three times daily Patient taking differently: 4 (four) times a week. Use as directed for blood glucose checks three times daily 11/24/14   Manya Silvas, CNM  albuterol (PROVENTIL HFA;VENTOLIN HFA) 108 (90 BASE) MCG/ACT inhaler Inhale 2 puffs into the lungs every 6 (six) hours as needed for wheezing or shortness of  breath.    Historical Provider, MD  beclomethasone (QVAR) 80 MCG/ACT inhaler Inhale 2 puffs into the lungs 2 (two) times daily as needed (for shortness of breath/wheezing).     Historical Provider, MD  Blood Glucose Monitoring Suppl (ACCU-CHEK NANO SMARTVIEW) W/DEVICE KIT 1 Device by Does not apply route 3 (three) times daily. Patient taking differently: 1 Device by Does not apply route 4 (four) times a week.  01/09/14   Frazier Richards, MD  clindamycin (CLEOCIN) 150 MG capsule Take 1 capsule (150 mg total) by mouth every 6 (six) hours. Patient not taking: Reported on 10/11/2016 06/09/16   Blanchie Dessert, MD  glucose blood (ACCU-CHEK  SMARTVIEW) test strip Use as directed for blood glucose checks 3 times daily. Patient taking differently: 4 (four) times a week. Use as directed for blood glucose checks 3 times daily. 11/24/14   Manya Silvas, CNM  HYDROcodone-acetaminophen (NORCO/VICODIN) 5-325 MG tablet Take 1-2 tablets by mouth every 6 (six) hours as needed. Patient not taking: Reported on 10/11/2016 06/09/16   Blanchie Dessert, MD  Insulin Pen Needle (INSUPEN PEN NEEDLES) 32G X 4 MM MISC Use as directed 01/09/14   Frazier Richards, MD  naproxen (NAPROSYN) 500 MG tablet Take 1 tablet (500 mg total) by mouth 2 (two) times daily with a meal. Patient taking differently: Take 500 mg by mouth 2 (two) times daily as needed.  05/01/16   Margarita Mail, PA-C  NUVARING 0.12-0.015 MG/24HR vaginal ring insert 1 ring vaginally for 3 weeks REMOVE for 1 week and repeat 03/06/16   Shelly Bombard, MD  polyethylene glycol [facility] / GLYCOLAX) packet Take 17 g by mouth daily as needed for mild constipation.    Historical Provider, MD    Family History Family History  Problem Relation Age of Onset  . Hypertension Mother   . Diabetes Mother     gestational  . Hypertension Maternal Grandmother   . Hypertension Maternal Grandfather   . Hypertension Paternal Grandmother   . Diabetes Paternal Grandmother     Social History Social History  Substance Use Topics  . Smoking status: Former Smoker    Types: Cigarettes    Quit date: 05/28/2014  . Smokeless tobacco: Never Used  . Alcohol use No     Allergies   Patient has no known allergies.   Review of Systems Review of Systems  Ten systems reviewed and are negative for acute change, except as noted in the HPI.   Physical Exam Updated Vital Signs BP 134/95 (BP Location: Left Arm)   Pulse 80   Temp 98.9 F (37.2 C) (Oral)   Resp 18   Ht _0  (1.727 m)   Wt 74.4 kg   LMP 09/22/2016 (Approximate)   SpO2 100%   BMI 24.94 kg/m   Physical Exam  Constitutional: She is oriented to  person, place, and time. She appears well-developed and well-nourished. No distress.  HENT:  Head: Normocephalic and atraumatic.  Eyes: Conjunctivae are normal. No scleral icterus.  Neck: Normal range of motion.  Cardiovascular: Normal rate, regular rhythm and normal heart sounds.  Exam reveals no gallop and no friction rub.   No murmur heard. Pulmonary/Chest: Effort normal and breath sounds normal. No respiratory distress.  Abdominal: Soft. Bowel sounds are normal. She exhibits no distension and no mass. There is tenderness in the epigastric area. There is no guarding.    Neurological: She is alert and oriented to person, place, and time.  Skin: Skin is warm and dry. She  is not diaphoretic.  Nursing note and vitals reviewed.    ED Treatments / Results  Labs (all labs ordered are listed, but only abnormal results are displayed) Labs Reviewed  COMPREHENSIVE METABOLIC PANEL - Abnormal; Notable for the following:       Result Value   Sodium 134 (*)    Glucose, Bld 351 (*)    AST 12 (*)    All other components within normal limits  CBC - Abnormal; Notable for the following:    RBC 5.26 (*)    MCV 72.4 (*)    MCH 24.7 (*)    All other components within normal limits  URINALYSIS, ROUTINE W REFLEX MICROSCOPIC - Abnormal; Notable for the following:    Specific Gravity, Urine 1.041 (*)    Glucose, UA >=500 (*)    Squamous Epithelial / LPF 0-5 (*)    All other components within normal limits  LIPASE, BLOOD  POC URINE PREG, ED    EKG  EKG Interpretation None       Radiology No results found.  Procedures Procedures (including critical care time)  Medications Ordered in ED Medications  sodium chloride 0.9 % bolus 1,000 mL (0 mLs Intravenous Stopped 10/11/16 2119)  morphine 4 MG/ML injection 4 mg (4 mg Intravenous Given 10/11/16 2026)  ondansetron (ZOFRAN) injection 4 mg (4 mg Intravenous Given 10/11/16 2024)  gi cocktail (Maalox,Lidocaine,Donnatal) (30 mLs Oral Given  10/11/16 2029)     Initial Impression / Assessment and Plan / ED Course  I have reviewed the triage vital signs and the nursing notes.  Pertinent labs & imaging results that were available during my care of the patient were reviewed by me and considered in my medical decision making (see chart for details).    , suspect gastritis or peptic ulcer disease. As counseled the patient on dietary modifications and will treat with Bentyl, Zegerid, and Carafate. Patient's follow-up with PCP. Patient is nontoxic, nonseptic appearing, in no apparent distress.  Patient's pain and other symptoms adequately managed in emergency department.  Fluid bolus given.  Labs, imaging and vitals reviewed.  Patient does not meet the SIRS or Sepsis criteria.  On repeat exam patient does not have a surgical abdomin and there are no peritoneal signs.  No indication of appendicitis, bowel obstruction, bowel perforation, cholecystitis, diverticulitis, PID or ectopic pregnancy.  Patient discharged home with symptomatic treatment and given strict instructions for follow-up with their primary care physician.  I have also discussed reasons to return immediately to the ER.  Patient expresses understanding and agrees with plan.      Final Clinical Impressions(s) / ED Diagnoses   Final diagnoses:  Epigastric pain    New Prescriptions New Prescriptions   No medications on file     Margarita Mail, PA-C 10/11/16 [place], MD 10/11/16 2328

## 2016-10-11 NOTE — ED Notes (Signed)
Pt ambulatory to restroom

## 2016-10-11 NOTE — ED Triage Notes (Signed)
Patient reports that she has had intermittent upper abdominal pain x 2 weeks. Patient states that she does not have a BM but every 2-3 days. Patient also c/o nause, but denies vomiting.

## 2016-10-11 NOTE — ED Notes (Signed)
POC Urine pregnancy result: NEG

## 2016-10-11 NOTE — ED Notes (Signed)
Deitriech Eden Lathe (Mother): [phone]

## 2016-10-11 NOTE — Discharge Instructions (Signed)

## 2017-05-28 ENCOUNTER — Other Ambulatory Visit: Payer: Self-pay | Admitting: Obstetrics

## 2017-09-11 ENCOUNTER — Encounter (HOSPITAL_COMMUNITY): Payer: Self-pay | Admitting: Psychiatry

## 2017-09-11 ENCOUNTER — Ambulatory Visit (INDEPENDENT_AMBULATORY_CARE_PROVIDER_SITE_OTHER): Payer: Self-pay | Admitting: Psychiatry

## 2017-09-11 VITALS — BP 126/70 | HR 86 | Ht 68.0 in | Wt 174.8 lb

## 2017-09-11 DIAGNOSIS — F99 Mental disorder, not otherwise specified: Secondary | ICD-10-CM

## 2017-09-11 DIAGNOSIS — F5105 Insomnia due to other mental disorder: Secondary | ICD-10-CM

## 2017-09-11 DIAGNOSIS — F411 Generalized anxiety disorder: Secondary | ICD-10-CM

## 2017-09-11 DIAGNOSIS — Z818 Family history of other mental and behavioral disorders: Secondary | ICD-10-CM

## 2017-09-11 DIAGNOSIS — Z62810 Personal history of physical and sexual abuse in childhood: Secondary | ICD-10-CM

## 2017-09-11 DIAGNOSIS — Z813 Family history of other psychoactive substance abuse and dependence: Secondary | ICD-10-CM

## 2017-09-11 DIAGNOSIS — Z87891 Personal history of nicotine dependence: Secondary | ICD-10-CM

## 2017-09-11 DIAGNOSIS — Z79899 Other long term (current) drug therapy: Secondary | ICD-10-CM

## 2017-09-11 MED ORDER — CITALOPRAM HYDROBROMIDE 10 MG PO TABS: 10.0000 mg | ORAL_TABLET | Freq: Every day | ORAL | 1 refills | Status: DC

## 2017-09-11 MED ORDER — TRAZODONE HCL 50 MG PO TABS: 50.0000 mg | ORAL_TABLET | Freq: Every day | ORAL | 1 refills | Status: DC
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
## 2017-09-11 NOTE — Patient Instructions (Signed)
START Celexa in the morning for anxiety - take this every day  CONTINUE Trazodone at night for sleep - take this around 2 hours before sleep

## 2017-10-10 ENCOUNTER — Ambulatory Visit (HOSPITAL_COMMUNITY): Payer: Self-pay | Admitting: Licensed Clinical Social Worker

## 2017-11-07 ENCOUNTER — Encounter (HOSPITAL_COMMUNITY): Payer: Self-pay | Admitting: Psychiatry

## 2017-11-07 ENCOUNTER — Ambulatory Visit (INDEPENDENT_AMBULATORY_CARE_PROVIDER_SITE_OTHER): Payer: BLUE CROSS/BLUE SHIELD | Admitting: Psychiatry

## 2017-11-07 DIAGNOSIS — Z87891 Personal history of nicotine dependence: Secondary | ICD-10-CM

## 2017-11-07 DIAGNOSIS — F411 Generalized anxiety disorder: Secondary | ICD-10-CM | POA: Diagnosis not present

## 2017-11-07 DIAGNOSIS — F99 Mental disorder, not otherwise specified: Secondary | ICD-10-CM

## 2017-11-07 DIAGNOSIS — F5105 Insomnia due to other mental disorder: Secondary | ICD-10-CM

## 2017-11-07 MED ORDER — CITALOPRAM HYDROBROMIDE 20 MG PO TABS: 20.0000 mg | ORAL_TABLET | Freq: Every day | ORAL | 1 refills | Status: DC

## 2017-11-07 NOTE — Progress Notes (Signed)
[facility] MD/PA/NP OP Progress Note  11/07/2017 11:37 AM Kimiya Brunelle  MRN:  [id number]  Chief Complaint: Doing a little better HPI: Tamara Reyes presents 15 minutes late for visit.  She forgot her intake for therapy last month and no showed to that.  She reports that she is just had a lot going on with work schedule, and family stressors.  Despite that, she reports that the Celexa has been helpful and she does note that her anxiety and irritability are improving.  We discussed increasing to 20 mg which she has yet to do.  She is more willing to do that now, and reports that her sleep is actually improved to the point that she does not need to use trazodone on any regular basis.  No acute safety issues and she agrees to follow-up in 6-8 weeks.  Visit Diagnosis:    ICD-10-CM   1. GAD (generalized anxiety disorder) F41.1 citalopram (CELEXA) 20 MG tablet  2. Insomnia due to other mental disorder F51.05 citalopram (CELEXA) 20 MG tablet   F99     Past Psychiatric History: See intake H&P for full details. Reviewed, with no updates at this time.   Past Medical History:  Past Medical History:  Diagnosis Date  . Asthma   . Diabetes mellitus   . Fibromyalgia   . Genital herpes   . Hypercholesteremia   . Hypertension   . PCOS (polycystic ovarian syndrome)     Past Surgical History:  Procedure Laterality Date  . abscess removal     . TONSILLECTOMY    . TONSILLECTOMY AND ADENOIDECTOMY     tonsils only    Family Psychiatric History: See intake H&P for full details. Reviewed, with no updates at this time.   Family History:  Family History  Problem Relation Age of Onset  . Hypertension Mother   . Diabetes Mother        gestational  . Hypertension Maternal Grandmother   . Hypertension Maternal Grandfather   . Hypertension Paternal Grandmother   . Diabetes Paternal Grandmother     Social History:  Social History   Socioeconomic History  . Marital status: Single    Spouse name: Not on file   . Number of children: Not on file  . Years of education: Not on file  . Highest education level: Not on file  Occupational History  . Not on file  Social Needs  . Financial resource strain: Not on file  . Food insecurity:    Worry: Not on file    Inability: Not on file  . Transportation needs:    Medical: Not on file    Non-medical: Not on file  Tobacco Use  . Smoking status: Former Smoker    Types: Cigarettes    Last attempt to quit: 05/28/2014    Years since quitting: 3.4  . Smokeless tobacco: Never Used  Substance and Sexual Activity  . Alcohol use: No  . Drug use: No  . Sexual activity: Yes    Birth control/protection: None    Comment: had nuva ring, but it is out now  Lifestyle  . Physical activity:    Days per week: Not on file    Minutes per session: Not on file  . Stress: Not on file  Relationships  . Social connections:    Talks on phone: Not on file    Gets together: Not on file    Attends religious service: Not on file    Active member of club or organization:  Not on file    Attends meetings of clubs or organizations: Not on file    Relationship status: Not on file  Other Topics Concern  . Not on file  Social History Narrative   Is in 12th grade at [employer] with mom    Allergies: No Known Allergies  Metabolic Disorder Labs: Lab Results  Component Value Date   HGBA1C 15.0 (H) 05/28/2016   MPG 384 05/28/2016   MPG 315 (H) 01/08/2014   No results found for: PROLACTIN No results found for: CHOL, TRIG, HDL, CHOLHDL, VLDL, LDLCALC No results found for: TSH  Therapeutic Level Labs: No results found for: LITHIUM No results found for: VALPROATE No components found for:  CBMZ  Current Medications: Current Outpatient Medications  Medication Sig Dispense Refill  . ACCU-CHEK FASTCLIX LANCETS MISC Use as directed for blood glucose checks three times daily (Patient taking differently: 4 (four) times a week. Use as directed for blood glucose  checks three times daily) 102 each 3  . albuterol (PROVENTIL HFA;VENTOLIN HFA) 108 (90 BASE) MCG/ACT inhaler Inhale 2 puffs into the lungs every 6 (six) hours as needed for wheezing or shortness of breath.    . beclomethasone (QVAR) 80 MCG/ACT inhaler Inhale 2 puffs into the lungs 2 (two) times daily as needed (for shortness of breath/wheezing).     . Blood Glucose Monitoring Suppl (ACCU-CHEK NANO SMARTVIEW) W/DEVICE KIT 1 Device by Does not apply route 3 (three) times daily. (Patient taking differently: 1 Device by Does not apply route 4 (four) times a week. ) 1 kit 0  . cetirizine (ZYRTEC) 10 MG tablet Take 10 mg by mouth at bedtime.     . citalopram (CELEXA) 20 MG tablet Take 1 tablet (20 mg total) by mouth daily. 90 tablet 1  . cyclobenzaprine (FLEXERIL) 10 MG tablet Take 1 tablet (10 mg total) by mouth 2 (two) times daily as needed for muscle spasms. 20 tablet 0  . dicyclomine (BENTYL) 20 MG tablet Take 1 tablet (20 mg total) by mouth 2 (two) times daily. 20 tablet 0  . glipiZIDE (GLUCOTROL XL) 10 MG 24 hr tablet Take 10 mg by mouth daily.  0  . glucose blood (ACCU-CHEK SMARTVIEW) test strip Use as directed for blood glucose checks 3 times daily. (Patient taking differently: 4 (four) times a week. Use as directed for blood glucose checks 3 times daily.) 100 each 3  . Insulin Pen Needle (INSUPEN PEN NEEDLES) 32G X 4 MM MISC Use as directed 50 each 3  . LANTUS SOLOSTAR 100 UNIT/ML Solostar Pen Inject 15 Units into the skin daily at 10 pm.   0  . metFORMIN (GLUCOPHAGE) 500 MG tablet Take 1 tablet (500 mg total) by mouth 2 (two) times daily with a meal. (Patient taking differently: Take 500 mg by mouth daily with breakfast. ) 60 tablet 2  . naproxen (NAPROSYN) 500 MG tablet Take 1 tablet (500 mg total) by mouth 2 (two) times daily with a meal. (Patient taking differently: Take 500 mg by mouth 2 (two) times daily as needed. ) 30 tablet 0  . NUVARING 0.12-0.015 MG/24HR vaginal ring insert 1 ring  vaginally for 3 weeks REMOVE for 1 week and repeat (Patient not taking: Reported on 09/11/2017) 1 each 11  . omeprazole-sodium bicarbonate (ZEGERID) 40-1100 MG capsule Take 1 capsule by mouth daily before breakfast. 30 capsule 0  . polyethylene glycol (MIRALAX / GLYCOLAX) packet Take 17 g by mouth daily as needed for mild constipation.    Marland Kitchen  simvastatin (ZOCOR) 10 MG tablet Take 1 tablet (10 mg total) by mouth every other day. 30 tablet 2  . traZODone (DESYREL) 50 MG tablet Take 1 tablet (50 mg total) by mouth at bedtime. 90 tablet 1   No current facility-administered medications for this visit.      Musculoskeletal: Strength & Muscle Tone: within normal limits Gait & Station: normal Patient leans: N/A  Psychiatric Specialty Exam: ROS  There were no vitals taken for this visit.There is no height or weight on file to calculate BMI.  General Appearance: Casual and Well Groomed  Eye Contact:  Good  Speech:  Clear and Coherent and Normal Rate  Volume:  Normal  Mood:  Anxious and Euthymic  Affect:  Appropriate and Congruent  Thought Process:  Goal Directed and Descriptions of Associations: Intact  Orientation:  Full (Time, Place, and Person)  Thought Content: Logical   Suicidal Thoughts:  No  Homicidal Thoughts:  No  Memory:  Immediate;   Good  Judgement:  Good  Insight:  Fair  Psychomotor Activity:  Normal  Concentration:  Attention Span: Good  Recall:  Good  Fund of Knowledge: Good  Language: Good  Akathisia:  Negative  Handed:  Right  AIMS (if indicated): not done  Assets:  Communication Skills Desire for Improvement Financial Resources/Insurance Housing Transportation  ADL's:  Intact  Cognition: WNL  Sleep:  Good   Screenings:   Assessment and Plan:  Tamika Shropshire presents with interval improvement of her anxiety symptoms and insomnia, would benefit from increase in Celexa to 20 mg.  No significant medication intolerance, she has done a better job setting boundaries  with her work and employers, and appears to be more consistently thinking through her problems rather than responding with reactive irritability.  1. GAD (generalized anxiety disorder)   2. Insomnia due to other mental disorder     Status of current problems: gradually improving  Labs Ordered: No orders of the defined types were placed in this encounter.   Labs Reviewed: n/a  Collateral Obtained/Records Reviewed: n/a  Plan:  Continue to encourage individual therapy for skill building Increase Celexa to 20 mg Trazodone as needed for sleep  I spent 15 minutes with the patient in direct face-to-face clinical care.  Greater than 50% of this time was spent in counseling and coordination of care with the patient.    Aundra Dubin, MD 11/07/2017, 11:37 AM

## 2017-12-02 ENCOUNTER — Emergency Department (HOSPITAL_COMMUNITY)
Admission: EM | Admit: 2017-12-02 | Discharge: 2017-12-03 | Disposition: A | Discharge status: Home / Self Care | Payer: Self-pay | Attending: Emergency Medicine | Admitting: Emergency Medicine

## 2017-12-02 ENCOUNTER — Encounter (HOSPITAL_COMMUNITY): Payer: Self-pay | Admitting: *Deleted

## 2017-12-02 DIAGNOSIS — Z794 Long term (current) use of insulin: Secondary | ICD-10-CM | POA: Insufficient documentation

## 2017-12-02 DIAGNOSIS — E119 Type 2 diabetes mellitus without complications: Secondary | ICD-10-CM | POA: Insufficient documentation

## 2017-12-02 DIAGNOSIS — Z79899 Other long term (current) drug therapy: Secondary | ICD-10-CM | POA: Insufficient documentation

## 2017-12-02 DIAGNOSIS — J029 Acute pharyngitis, unspecified: Secondary | ICD-10-CM | POA: Insufficient documentation

## 2017-12-02 DIAGNOSIS — K0889 Other specified disorders of teeth and supporting structures: Secondary | ICD-10-CM | POA: Insufficient documentation

## 2017-12-02 DIAGNOSIS — I10 Essential (primary) hypertension: Secondary | ICD-10-CM | POA: Insufficient documentation

## 2017-12-02 DIAGNOSIS — J45909 Unspecified asthma, uncomplicated: Secondary | ICD-10-CM | POA: Insufficient documentation

## 2017-12-02 DIAGNOSIS — Z87891 Personal history of nicotine dependence: Secondary | ICD-10-CM | POA: Insufficient documentation

## 2017-12-02 LAB — GROUP A STREP BY PCR: Group A Strep by PCR: NOT DETECTED

## 2017-12-02 NOTE — ED Triage Notes (Signed)
Pt c/o sore throat x 6 days. Tonsillectomy @ 22 y/o.

## 2017-12-02 NOTE — ED Notes (Signed)
Bed: [facility] Expected date:  Expected time:  Means of arrival:  Comments: 

## 2017-12-03 MED ORDER — CLINDAMYCIN HCL 300 MG PO CAPS: 300.0000 mg | ORAL_CAPSULE | Freq: Three times a day (TID) | ORAL | 0 refills | Status: DC

## 2017-12-03 MED ORDER — TRAMADOL HCL 50 MG PO TABS: 50.0000 mg | ORAL_TABLET | Freq: Four times a day (QID) | ORAL | 0 refills | Status: DC | PRN

## 2017-12-03 MED ORDER — LIDOCAINE VISCOUS 2 % MT SOLN: 20.0000 mL | OROMUCOSAL | 0 refills | Status: DC | PRN

## 2017-12-03 MED ORDER — CLINDAMYCIN HCL 300 MG PO CAPS
300.0000 mg | ORAL_CAPSULE | Freq: Once | ORAL | Status: AC
  Administered 2017-12-03: 300 mg via ORAL
  Filled 2017-12-03: qty 1

## 2017-12-03 MED ORDER — HYDROCODONE-ACETAMINOPHEN 5-325 MG PO TABS
1.0000 | ORAL_TABLET | Freq: Once | ORAL | Status: AC
  Administered 2017-12-03: 1 via ORAL
  Filled 2017-12-03: qty 1

## 2017-12-03 NOTE — Discharge Instructions (Signed)
Your strep test was negative.  You do have signs of a dental infection.  Take antibiotics as prescribed.  Take the tramadol for pain this medication make you drowsy so do not drive with it.  Continue taken Tylenol and Motrin.  Would also use the viscous lidocaine to help numb your throat.  It is very important you follow-up with a primary care doctor and oral surgeon or dentist.  Have provided you resources.  Return the ED with any worsening symptoms.

## 2017-12-03 NOTE — ED Provider Notes (Signed)
[facility] DEPT Provider Note   CSN: [id number] Arrival date & time: 12/02/17  2036     History   Chief Complaint Chief Complaint  Patient presents with  . Sore Throat    HPI Tamara Reyes is a 22 y.o. female.  HPI 22 year old African-American female past medical history significant for hypertension, diabetes that presents to the emergency department today for evaluation of sore throat and dental pain.  Patient states that her sore throat started 6 days ago.  She states that it is painful to swallow.  Patient denies any difficulty swallowing or breathing.  The patient also states that she has known wisdom teeth that need to be removed.  Saw an oral surgeon last week but cannot afford to have them removed.  Patient states that she has been dealing with dental pain for the past 6 months however they are worsening.  Patient reports bilateral upper and lower molar dental pain.  Patient denies any associated fevers or chills.  She does report having some rhinorrhea and nasal congestion with a mild nonproductive cough.  Denies any associated otalgia.  Patient has been taken naproxen for symptoms.  Nothing makes better.  States her blood sugars are relatively controlled on insulin.  Denies any associated nausea, vomiting or diarrhea. Past Medical History:  Diagnosis Date  . Asthma   . Diabetes mellitus   . Fibromyalgia   . Genital herpes   . Hypercholesteremia   . Hypertension   . PCOS (polycystic ovarian syndrome)     Patient Active Problem List   Diagnosis Date Noted  . Vaginitis and vulvovaginitis, unspecified 05/07/2014  . Hyperglycemia 01/07/2014  . Nexplanon insertion 08/26/2013  . Type 2 diabetes mellitus in patient age 30-19 years with HbA1C goal below 7.5 10/19/2011  . Acanthosis 10/19/2011  . Hyperlipidemia 10/19/2011  . Hypertension 10/19/2011  . Obesity 10/19/2011    Past Surgical History:  Procedure Laterality Date  . abscess removal       . TONSILLECTOMY    . TONSILLECTOMY AND ADENOIDECTOMY     tonsils only     OB History    Gravida  0   Para  0   Term  0   Preterm  0   AB  0   Living  0     SAB  0   TAB  0   Ectopic  0   Multiple  0   Live Births               Home Medications    Prior to Admission medications   Medication Sig Start Date End Date Taking? Authorizing Provider  ACCU-CHEK FASTCLIX LANCETS MISC Use as directed for blood glucose checks three times daily Patient taking differently: 4 (four) times a week. Use as directed for blood glucose checks three times daily 11/24/14   Tamala Julian, [place], CNM  albuterol (PROVENTIL HFA;VENTOLIN HFA) 108 (90 BASE) MCG/ACT inhaler Inhale 2 puffs into the lungs every 6 (six) hours as needed for wheezing or shortness of breath.    [provider]  beclomethasone (QVAR) 80 MCG/ACT inhaler Inhale 2 puffs into the lungs 2 (two) times daily as needed (for shortness of breath/wheezing).     [provider]  Blood Glucose Monitoring Suppl (ACCU-CHEK NANO SMARTVIEW) W/DEVICE KIT 1 Device by Does not apply route 3 (three) times daily. Patient taking differently: 1 Device by Does not apply route 4 (four) times a week.  01/09/14   Frazier Richards, MD  cetirizine (ZYRTEC) 10 MG tablet Take 10 mg by mouth at bedtime.     [provider]  citalopram (CELEXA) 20 MG tablet Take 1 tablet (20 mg total) by mouth daily. 11/07/17 11/07/18  Aundra Dubin, MD  clindamycin (CLEOCIN) 300 MG capsule Take 1 capsule (300 mg total) by mouth 3 (three) times daily. 12/03/17   Doristine Devoid, PA-C  cyclobenzaprine (FLEXERIL) 10 MG tablet Take 1 tablet (10 mg total) by mouth 2 (two) times daily as needed for muscle spasms. 05/01/16   Margarita Mail, PA-C  dicyclomine (BENTYL) 20 MG tablet Take 1 tablet (20 mg total) by mouth 2 (two) times daily. 10/11/16   Harris, Vernie Shanks, PA-C  glipiZIDE (GLUCOTROL XL) 10 MG 24 hr tablet Take 10 mg by mouth daily. 03/06/16    [provider]  glucose blood (ACCU-CHEK SMARTVIEW) test strip Use as directed for blood glucose checks 3 times daily. Patient taking differently: 4 (four) times a week. Use as directed for blood glucose checks 3 times daily. 11/24/14   Tamala Julian, [place], CNM  Insulin Pen Needle (INSUPEN PEN NEEDLES) 32G X 4 MM MISC Use as directed 01/09/14   Frazier Richards, MD  LANTUS SOLOSTAR 100 UNIT/ML Solostar Pen Inject 15 Units into the skin daily at 10 pm.  09/29/15   [provider]  lidocaine (XYLOCAINE) 2 % solution Use as directed 20 mLs in the mouth or throat as needed for mouth pain. 12/03/17   Doristine Devoid, PA-C  metFORMIN (GLUCOPHAGE) 500 MG tablet Take 1 tablet (500 mg total) by mouth 2 (two) times daily with a meal. Patient taking differently: Take 500 mg by mouth daily with breakfast.  11/24/14   Tamala Julian, [place], CNM  naproxen (NAPROSYN) 500 MG tablet Take 1 tablet (500 mg total) by mouth 2 (two) times daily with a meal. Patient taking differently: Take 500 mg by mouth 2 (two) times daily as needed.  05/01/16   Margarita Mail, PA-C  NUVARING 0.12-0.015 MG/24HR vaginal ring insert 1 ring vaginally for 3 weeks REMOVE for 1 week and repeat Patient not taking: Reported on 09/11/2017 05/29/17   Shelly Bombard, MD  omeprazole-sodium bicarbonate (ZEGERID) 40-1100 MG capsule Take 1 capsule by mouth daily before breakfast. 10/11/16   Margarita Mail, PA-C  polyethylene glycol (MIRALAX / GLYCOLAX) packet Take 17 g by mouth daily as needed for mild constipation.    [provider]  simvastatin (ZOCOR) 10 MG tablet Take 1 tablet (10 mg total) by mouth every other day. 11/24/14   Tamala Julian, [place], CNM  traMADol (ULTRAM) 50 MG tablet Take 1 tablet (50 mg total) by mouth every 6 (six) hours as needed. 12/03/17   Doristine Devoid, PA-C  traZODone (DESYREL) 50 MG tablet Take 1 tablet (50 mg total) by mouth at bedtime. 09/11/17   Eksir, Richard Miu, MD    Family History Family  History  Problem Relation Age of Onset  . Hypertension Mother   . Diabetes Mother        gestational  . Hypertension Maternal Grandmother   . Hypertension Maternal Grandfather   . Hypertension Paternal Grandmother   . Diabetes Paternal Grandmother     Social History Social History   Tobacco Use  . Smoking status: Former Smoker    Types: Cigarettes    Last attempt to quit: 05/28/2014    Years since quitting: 3.5  . Smokeless tobacco: Never Used  Substance Use Topics  . Alcohol use: No  . Drug use: No  Allergies   Patient has no known allergies.   Review of Systems Review of Systems  All other systems reviewed and are negative.    Physical Exam Updated Vital Signs BP 120/83 (BP Location: Right Arm)   Pulse 83   Temp 98.5 F (36.9 C) (Oral)   Resp 18   Ht '5\' 8"'$  (1.727 m)   Wt 77.1 kg (170 lb)   LMP 11/28/2017 (Exact Date)   SpO2 100%   BMI 25.85 kg/m   Physical Exam  Constitutional: She appears well-developed and well-nourished. No distress.  Sleeping on my examination.  Appears to be in no acute distress.  HENT:  Head: Normocephalic and atraumatic.  Right Ear: Tympanic membrane, external ear and ear canal normal.  Left Ear: Tympanic membrane, external ear and ear canal normal.  Nose: Rhinorrhea present. No mucosal edema.  Mouth/Throat: Uvula is midline and mucous membranes are normal. No trismus in the jaw. No uvula swelling. Posterior oropharyngeal erythema present. No oropharyngeal exudate, posterior oropharyngeal edema or tonsillar abscesses.  Patient appears to have dental caries and impacted wisdom teeth bilaterally.  Mild gingival edema.  There is no sublingual or submandibular swelling.  No associated gross abscess or purulent drainage.  No erythema noted.  Patient's oropharynx with mild erythema and edema but no exudates.  Is midline.  Patient managing secretions tolerating airway.  Patient does not have any muffled voice.  Eyes: Right eye exhibits  no discharge. Left eye exhibits no discharge. No scleral icterus.  Neck: Normal range of motion. Neck supple.  No nuchal rigidity.  Pulmonary/Chest: No respiratory distress.  Musculoskeletal: Normal range of motion.  Lymphadenopathy:    She has cervical adenopathy.  Neurological: She is alert.  Skin: Skin is warm and dry. Capillary refill takes less than 2 seconds. No pallor.  Psychiatric: Her behavior is normal. Judgment and thought content normal.  Nursing note and vitals reviewed.    ED Treatments / Results  Labs (all labs ordered are listed, but only abnormal results are displayed) Labs Reviewed  GROUP A STREP BY PCR    EKG None  Radiology No results found.  Procedures Procedures (including critical care time)  Medications Ordered in ED Medications  clindamycin (CLEOCIN) capsule 300 mg (has no administration in time range)  HYDROcodone-acetaminophen (NORCO/VICODIN) 5-325 MG per tablet 1 tablet (has no administration in time range)     Initial Impression / Assessment and Plan / ED Course  I have reviewed the triage vital signs and the nursing notes.  Pertinent labs & imaging results that were available during my care of the patient were reviewed by me and considered in my medical decision making (see chart for details).     She presents to the ED for evaluation of sore throat.  On examination she does report having known infected wisdom teeth that need to be removed.  Patient states this may be causing her symptoms.  On exam patient overall well-appearing and nontoxic.  Vital signs are reassuring.  She is afebrile not tachycardic in the ED.  No trismus noted.  Uvula is midline.  No muffled voice.  Patient has no signs of peritonsillar infection I doubt deep neck infection.  Do not feel the patient needs imaging at this time.  She does have signs of dental abscess.  Strep test was negative.  No signs of ludwigs angina.  Patient managing secretions tolerating airway.  She  was sleeping on my examination.  Patient will be started on clindamycin to cover infection.  We will also give a short course of pain medication and viscous lidocaine.  Have given patient oral surgery follow-up and dental resources.  Instructed patient to keep a close eye on her blood sugars.  If symptoms worsen to return the ED immediately.  Pt is hemodynamically stable, in NAD, & able to ambulate in the ED. Evaluation does not show pathology that would require ongoing emergent intervention or inpatient treatment. I explained the diagnosis to the patient. Pain has been managed & has no complaints prior to dc. Pt is comfortable with above plan and is stable for discharge at this time. All questions were answered prior to disposition. Strict return precautions for f/u to the ED were discussed. Encouraged follow up with PCP.   Final Clinical Impressions(s) / ED Diagnoses   Final diagnoses:  Sore throat  Pain, dental    ED Discharge Orders        Ordered    clindamycin (CLEOCIN) 300 MG capsule  3 times daily     12/03/17 0102    traMADol (ULTRAM) 50 MG tablet  Every 6 hours PRN     12/03/17 0102    lidocaine (XYLOCAINE) 2 % solution  As needed     12/03/17 0102       Doristine Devoid, PA-C 12/03/17 Garnette Scheuermann, MD 12/03/17 [phone]

## 2017-12-12 ENCOUNTER — Ambulatory Visit (HOSPITAL_COMMUNITY): Payer: Self-pay | Admitting: Licensed Clinical Social Worker

## 2018-01-02 ENCOUNTER — Encounter (HOSPITAL_COMMUNITY): Payer: Self-pay | Admitting: Psychiatry

## 2018-01-02 ENCOUNTER — Ambulatory Visit (HOSPITAL_COMMUNITY): Payer: BLUE CROSS/BLUE SHIELD | Admitting: Psychiatry

## 2018-01-02 NOTE — Telephone Encounter (Signed)
Fyi, we can reschedule when next available

## 2018-01-21 ENCOUNTER — Ambulatory Visit (HOSPITAL_COMMUNITY): Payer: Self-pay | Admitting: Licensed Clinical Social Worker

## 2018-04-08 IMAGING — US US PELVIS COMPLETE
1 series · 13 of 25 positions shown · non-contrast
Comparison: Ultrasound dated 05/08/2014

CLINICAL DATA: 20-year-old female with left adnexal pain.

EXAM:
TRANSABDOMINAL AND TRANSVAGINAL ULTRASOUND OF PELVIS
DOPPLER ULTRASOUND OF OVARIES
TECHNIQUE: Both transabdominal and transvaginal ultrasound examinations of the
pelvis were performed. Transabdominal technique was performed for
global imaging of the pelvis including uterus, ovaries, adnexal
regions, and pelvic cul-de-sac.
It was necessary to proceed with endovaginal exam following the
transabdominal exam to visualize the endometrium and the ovaries.
Color and duplex Doppler ultrasound was utilized to evaluate blood
flow to the ovaries.

[Series 1: us pelvis complete · 0.28mm/px · 13 of 53 slices shown]
[im 1/53]
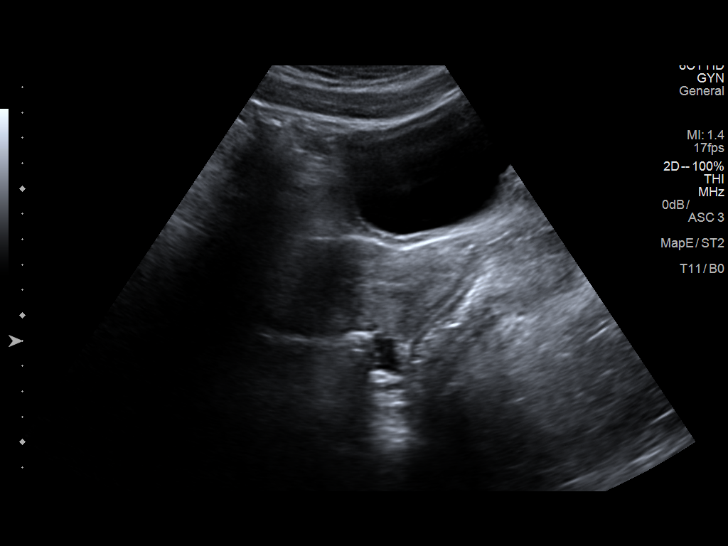
[im 5/53]
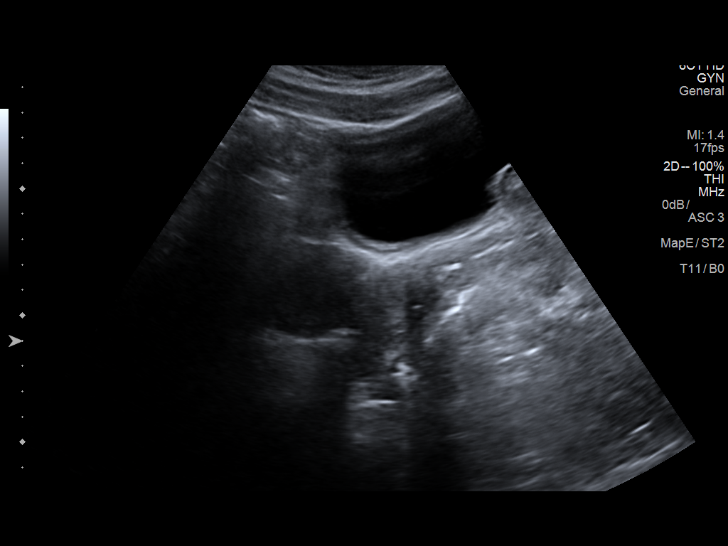
[im 9/53]
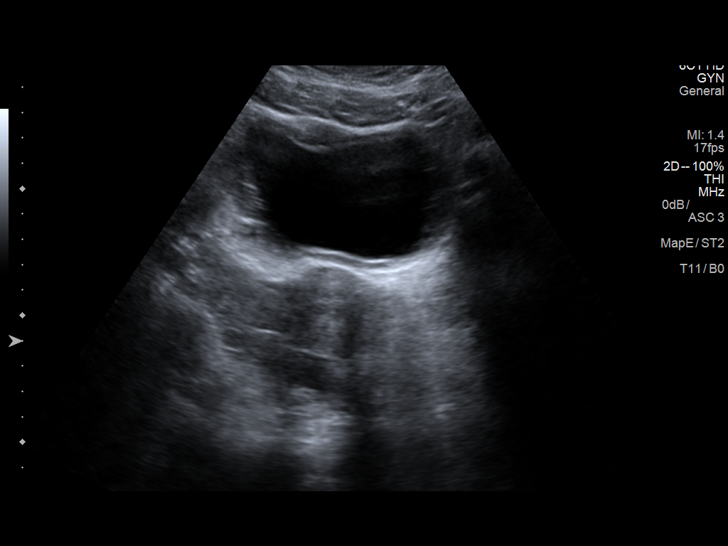
[im 14/53]
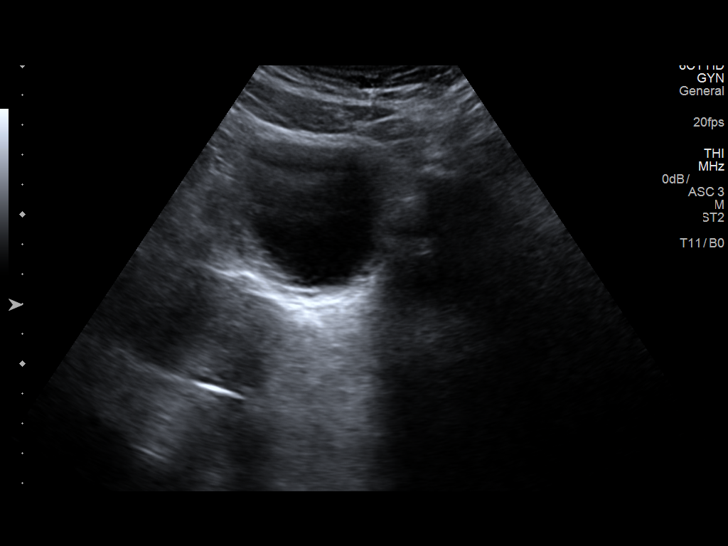
[im 18/53]
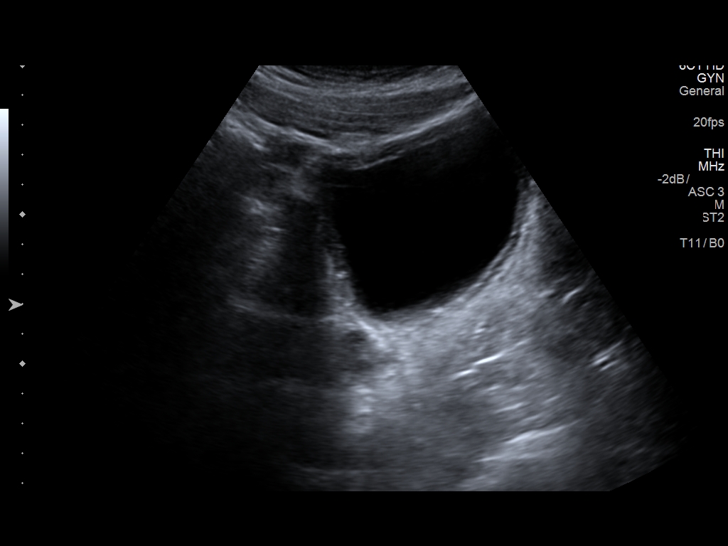
[im 22/53]
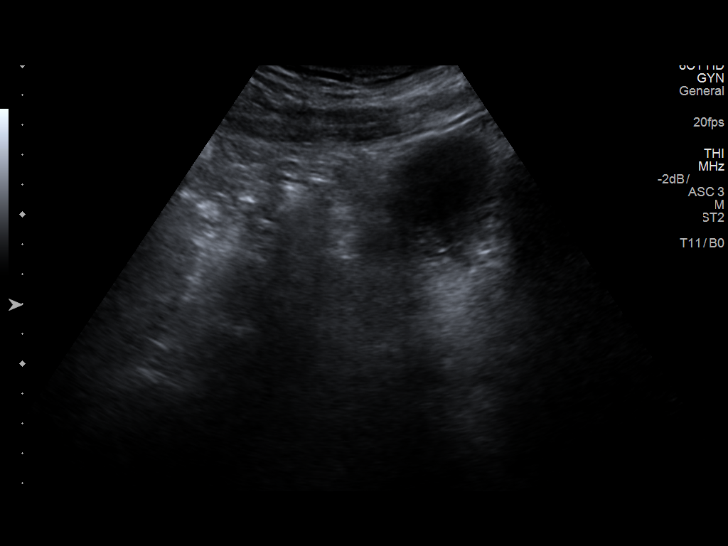
[im 27/53]
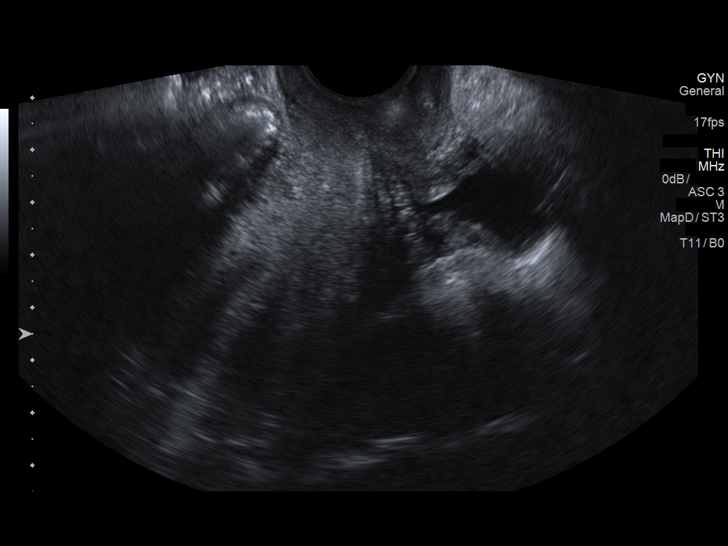
[im 31/53]
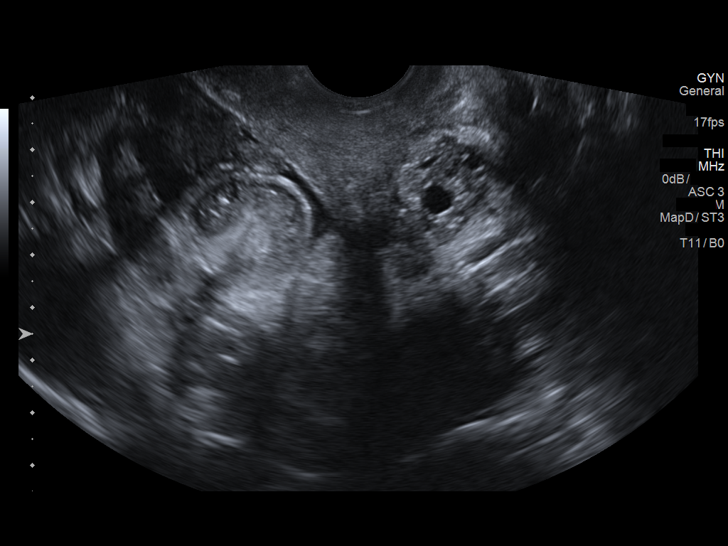
[im 35/53]
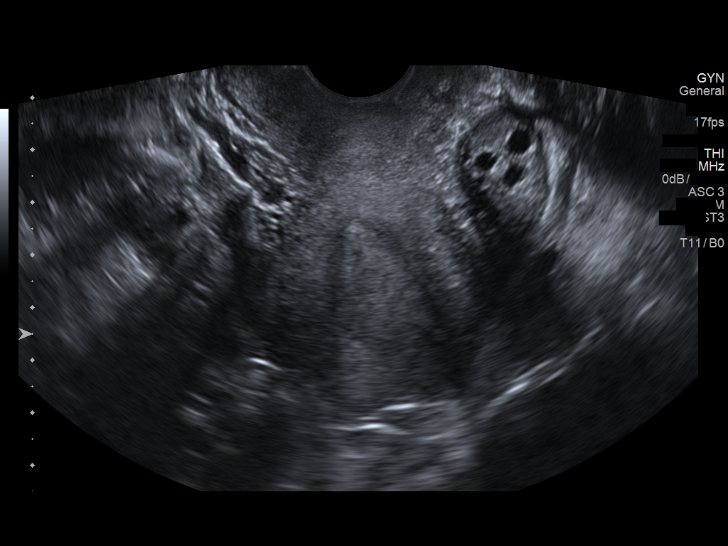
[im 40/53]
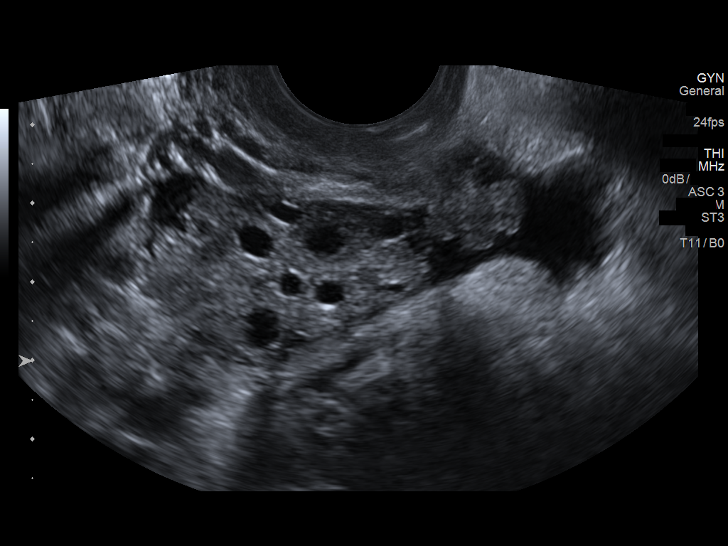
[im 44/53]
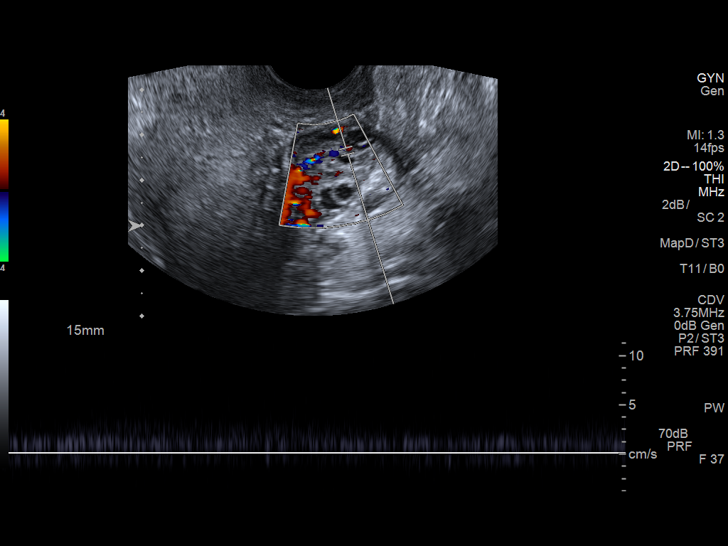
[im 48/53]
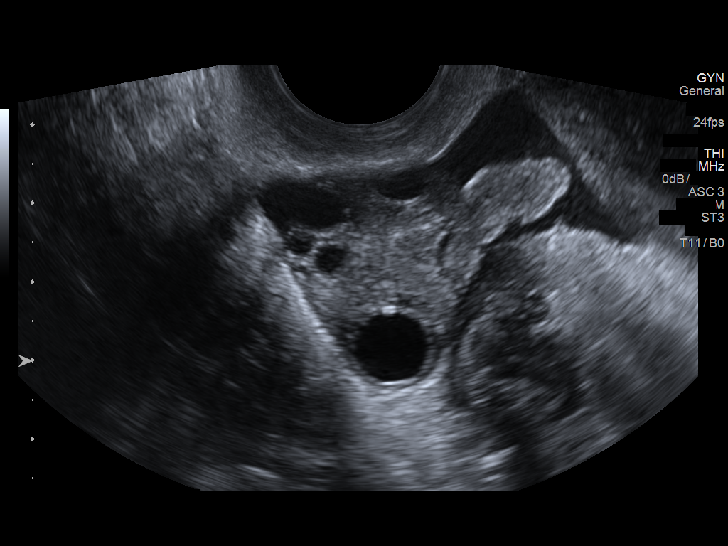
[im 53/53]
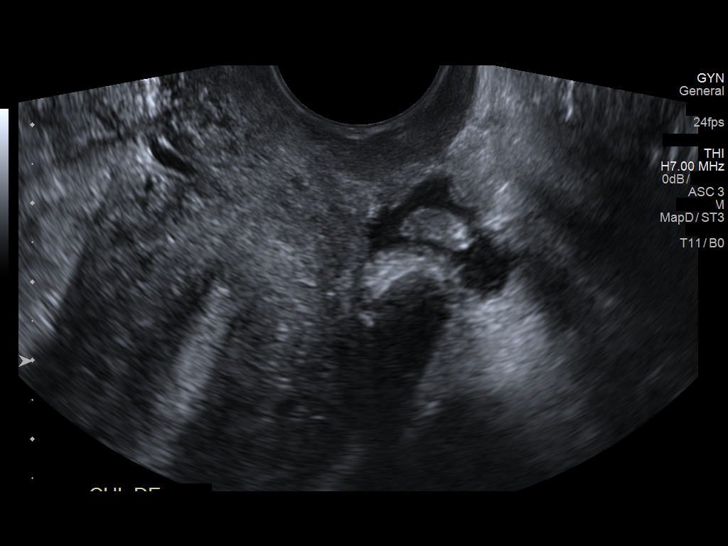

[13 of 25 positions shown; findings below may reference images not displayed]

FINDINGS: Uterus

Measurements:8.8 x 4.2 x 3.8 cm. No fibroids or other mass
visualized.

Endometrium

Thickness: 6 mm.  No focal abnormality visualized.

Right ovary

Measurements: 3.7 x 2.3 x 2.4 cm. Normal appearance/no adnexal mass.
Echogenic structure adjacent to the right ovary see represent bowel
or ovarian tissue. A hematoma is less likely.

Left ovary

Measurements: 3.6 x 2.4 x 2.3 cm. Normal appearance/no adnexal mass.

Pulsed Doppler evaluation of both ovaries demonstrates normal
low-resistance arterial and venous waveforms.

Other findings

Small amount of fluid noted within the pelvis and surrounding both
ovaries with low-level internal echoes.
IMPRESSION: Unremarkable pelvic ultrasound.

## 2018-07-15 ENCOUNTER — Emergency Department (HOSPITAL_COMMUNITY)
Admission: EM | Admit: 2018-07-15 | Discharge: 2018-07-15 | Disposition: A | Discharge status: Home / Self Care | Payer: Self-pay | Attending: Emergency Medicine | Admitting: Emergency Medicine

## 2018-07-15 ENCOUNTER — Emergency Department (HOSPITAL_COMMUNITY): Payer: Self-pay

## 2018-07-15 ENCOUNTER — Encounter (HOSPITAL_COMMUNITY): Payer: Self-pay | Admitting: Emergency Medicine

## 2018-07-15 ENCOUNTER — Other Ambulatory Visit: Payer: Self-pay

## 2018-07-15 DIAGNOSIS — E119 Type 2 diabetes mellitus without complications: Secondary | ICD-10-CM | POA: Insufficient documentation

## 2018-07-15 DIAGNOSIS — E785 Hyperlipidemia, unspecified: Secondary | ICD-10-CM | POA: Insufficient documentation

## 2018-07-15 DIAGNOSIS — E7800 Pure hypercholesterolemia, unspecified: Secondary | ICD-10-CM | POA: Insufficient documentation

## 2018-07-15 DIAGNOSIS — Z79899 Other long term (current) drug therapy: Secondary | ICD-10-CM | POA: Insufficient documentation

## 2018-07-15 DIAGNOSIS — Z794 Long term (current) use of insulin: Secondary | ICD-10-CM | POA: Insufficient documentation

## 2018-07-15 DIAGNOSIS — R42 Dizziness and giddiness: Secondary | ICD-10-CM | POA: Insufficient documentation

## 2018-07-15 DIAGNOSIS — J45909 Unspecified asthma, uncomplicated: Secondary | ICD-10-CM | POA: Insufficient documentation

## 2018-07-15 DIAGNOSIS — Z87891 Personal history of nicotine dependence: Secondary | ICD-10-CM | POA: Insufficient documentation

## 2018-07-15 DIAGNOSIS — I10 Essential (primary) hypertension: Secondary | ICD-10-CM | POA: Insufficient documentation

## 2018-07-15 DIAGNOSIS — L600 Ingrowing nail: Secondary | ICD-10-CM | POA: Insufficient documentation

## 2018-07-15 LAB — CBC WITH DIFFERENTIAL/PLATELET
Abs Immature Granulocytes: 0.02 10*3/uL (ref 0.00–0.07)
BASOS ABS: 0 10*3/uL (ref 0.0–0.1)
Basophils Relative: 0 %
EOS ABS: 0 10*3/uL (ref 0.0–0.5)
Eosinophils Relative: 0 %
HEMATOCRIT: 42 % (ref 36.0–46.0)
Hemoglobin: 13.2 g/dL (ref 12.0–15.0)
IMMATURE GRANULOCYTES: 0 %
LYMPHS ABS: 2.4 10*3/uL (ref 0.7–4.0)
LYMPHS PCT: 26 %
MCH: 23.9 pg — ABNORMAL LOW (ref 26.0–34.0)
MCHC: 31.4 g/dL (ref 30.0–36.0)
MCV: 76.1 fL — ABNORMAL LOW (ref 80.0–100.0)
Monocytes Absolute: 0.7 10*3/uL (ref 0.1–1.0)
Monocytes Relative: 7 %
NEUTROS ABS: 6.2 10*3/uL (ref 1.7–7.7)
NEUTROS PCT: 67 %
NRBC: 0 % (ref 0.0–0.2)
Platelets: 242 10*3/uL (ref 150–400)
RBC: 5.52 MIL/uL — ABNORMAL HIGH (ref 3.87–5.11)
RDW: 13.2 % (ref 11.5–15.5)
WBC: 9.4 10*3/uL (ref 4.0–10.5)

## 2018-07-15 LAB — URINALYSIS, ROUTINE W REFLEX MICROSCOPIC
BILIRUBIN URINE: NEGATIVE
Bacteria, UA: NONE SEEN
Glucose, UA: >=500 mg/dL — AB
HGB URINE DIPSTICK: NEGATIVE
Ketones, ur: 20 mg/dL — AB
Leukocytes, UA: NEGATIVE
Nitrite: NEGATIVE
PH: 7 (ref 5.0–8.0)
Protein, ur: NEGATIVE mg/dL
SPECIFIC GRAVITY, URINE: 1.043 — AB (ref 1.005–1.030)

## 2018-07-15 LAB — D-DIMER, QUANTITATIVE: D-Dimer, Quant: 0.38 ug/mL-FEU (ref 0.00–0.50)

## 2018-07-15 LAB — CBG MONITORING, ED: Glucose-Capillary: 291 mg/dL — ABNORMAL HIGH (ref 70–99)

## 2018-07-15 LAB — BASIC METABOLIC PANEL
ANION GAP: 9 (ref 5–15)
BUN: 9 mg/dL (ref 6–20)
CHLORIDE: 99 mmol/L (ref 98–111)
CO2: 23 mmol/L (ref 22–32)
Calcium: 9 mg/dL (ref 8.9–10.3)
Creatinine, Ser: 0.65 mg/dL (ref 0.44–1.00)
GFR calc Af Amer: >60 mL/min (ref >60)
GLUCOSE: 361 mg/dL — AB (ref 70–99)
POTASSIUM: 4 mmol/L (ref 3.5–5.1)
Sodium: 131 mmol/L — ABNORMAL LOW (ref 135–145)

## 2018-07-15 LAB — RAPID URINE DRUG SCREEN, HOSP PERFORMED
Amphetamines: NOT DETECTED
Barbiturates: NOT DETECTED
Benzodiazepines: NOT DETECTED
COCAINE: NOT DETECTED
Opiates: NOT DETECTED
TETRAHYDROCANNABINOL: POSITIVE — AB

## 2018-07-15 LAB — ETHANOL: Alcohol, Ethyl (B): <10 mg/dL (ref <10)

## 2018-07-15 LAB — PREGNANCY, URINE: PREG TEST UR: NEGATIVE

## 2018-07-15 LAB — TROPONIN I: Troponin I: <0.03 ng/mL (ref <0.03)

## 2018-07-15 MED ORDER — LORAZEPAM 2 MG/ML IJ SOLN
1.0000 mg | Freq: Once | INTRAMUSCULAR | Status: DC
  Filled 2018-07-15: qty 1

## 2018-07-15 MED ORDER — SODIUM CHLORIDE 0.9 % IV BOLUS
1000.0000 mL | Freq: Once | INTRAVENOUS | Status: AC
  Administered 2018-07-15: 1000 mL via INTRAVENOUS

## 2018-07-15 NOTE — ED Notes (Signed)
Checked patient cbg it was 40 notified RN Brandi of blood sugar patient is resting with call ell in reach

## 2018-07-15 NOTE — ED Notes (Signed)
Pt verbalizes understanding of d/c instructions. Pt taken to lobby in wheelchair at d/c with all belongings and with family.   

## 2018-07-15 NOTE — ED Provider Notes (Signed)
[facility]ERG[facility]Y DEPARTMENT Provider Note   CSN: [id number] Arrival date & time: 07/15/18  1025     History   Chief Complaint Chief Complaint  Patient presents with  . Dizziness  . Ingrown Toenail    HPI Hurley Blevins is a 22 y.o. female.  HPI  Pt was seen at 1035. Per pt, c/o gradual onset and persistence of multiple intermittent episodes of "dizziness" for the past 2 weeks. Pt describes the dizziness as lightheadedness and "zoning out." States her symptoms occur when she moves from sitting to standing. States she was "exposed to marijuana" this morning; denies use. Denies CP/palpitations, no SOB/cough, no abd pain, no N/V/D, no back pain, no focal motor weakness, no tingling/numbness in extremities, no fevers, no rash, no falls, no injury, no syncope.    Past Medical History:  Diagnosis Date  . Asthma   . Diabetes mellitus   . Fibromyalgia   . Genital herpes   . Hypercholesteremia   . Hypertension   . PCOS (polycystic ovarian syndrome)     Patient Active Problem List   Diagnosis Date Noted  . Vaginitis and vulvovaginitis, unspecified 05/07/2014  . Hyperglycemia 01/07/2014  . Nexplanon insertion 08/26/2013  . Type 2 diabetes mellitus in patient age 31-19 years with HbA1C goal below 7.5 10/19/2011  . Acanthosis 10/19/2011  . Hyperlipidemia 10/19/2011  . Hypertension 10/19/2011  . Obesity 10/19/2011    Past Surgical History:  Procedure Laterality Date  . abscess removal     . TONSILLECTOMY    . TONSILLECTOMY AND ADENOIDECTOMY     tonsils only     OB History    Gravida  0   Para  0   Term  0   Preterm  0   AB  0   Living  0     SAB  0   TAB  0   Ectopic  0   Multiple  0   Live Births               Home Medications    Prior to Admission medications   Medication Sig Start Date End Date Taking? Authorizing Provider  cetirizine (ZYRTEC) 10 MG tablet Take 10 mg by mouth as needed for allergies.    Yes [provider]  citalopram (CELEXA) 20 MG tablet Take 1 tablet (20 mg total) by mouth daily. Patient taking differently: Take 20 mg by mouth as needed (anxiety or depression).  11/07/17 11/07/18 Yes Eksir, Richard Miu, MD  cyclobenzaprine (FLEXERIL) 10 MG tablet Take 1 tablet (10 mg total) by mouth 2 (two) times daily as needed for muscle spasms. 05/01/16  Yes Harris, Abigail, PA-C  dicyclomine (BENTYL) 20 MG tablet Take 1 tablet (20 mg total) by mouth 2 (two) times daily. 10/11/16  Yes Harris, Abigail, PA-C  LANTUS SOLOSTAR 100 UNIT/ML Solostar Pen Inject 15 Units into the skin at bedtime as needed (diabetes).  09/29/15  Yes [provider]  metFORMIN (GLUCOPHAGE) 500 MG tablet Take 1 tablet (500 mg total) by mouth 2 (two) times daily with a meal. Patient taking differently: Take 500 mg by mouth daily as needed.  11/24/14  Yes Smith, [place], CNM  naproxen (NAPROSYN) 500 MG tablet Take 1 tablet (500 mg total) by mouth 2 (two) times daily with a meal. Patient taking differently: Take 500 mg by mouth 2 (two) times daily as needed for mild pain.  05/01/16  Yes Harris, Abigail, PA-C  omeprazole-sodium bicarbonate (ZEGERID) 40-1100 MG capsule Take  1 capsule by mouth daily before breakfast. Patient taking differently: Take 1 capsule by mouth as needed (acid reflux/heartburn).  10/11/16  Yes Harris, Abigail, PA-C  polyethylene glycol (MIRALAX / GLYCOLAX) packet Take 17 g by mouth as needed for mild constipation.    Yes [provider]  traMADol (ULTRAM) 50 MG tablet Take 1 tablet (50 mg total) by mouth every 6 (six) hours as needed. Patient taking differently: Take 50 mg by mouth every 6 (six) hours as needed for moderate pain.  12/03/17  Yes Leaphart, Zack Seal, PA-C  traZODone (DESYREL) 50 MG tablet Take 1 tablet (50 mg total) by mouth at bedtime. Patient taking differently: Take 50 mg by mouth as needed for sleep.  09/11/17  Yes Eksir, Richard Miu, MD  ACCU-CHEK FASTCLIX LANCETS MISC Use  as directed for blood glucose checks three times daily Patient taking differently: 4 (four) times a week. Use as directed for blood glucose checks three times daily 11/24/14   Tamala Julian, [place], CNM  Blood Glucose Monitoring Suppl (ACCU-CHEK NANO SMARTVIEW) W/DEVICE KIT 1 Device by Does not apply route 3 (three) times daily. Patient taking differently: 1 Device by Does not apply route 4 (four) times a week.  01/09/14   Frazier Richards, MD  glucose blood (ACCU-CHEK SMARTVIEW) test strip Use as directed for blood glucose checks 3 times daily. Patient taking differently: 4 (four) times a week. Use as directed for blood glucose checks 3 times daily. 11/24/14   Tamala Julian, [place], CNM  Insulin Pen Needle (INSUPEN PEN NEEDLES) 32G X 4 MM MISC Use as directed 01/09/14   Frazier Richards, MD  lidocaine (XYLOCAINE) 2 % solution Use as directed 20 mLs in the mouth or throat as needed for mouth pain. Patient not taking: Reported on 07/15/2018 12/03/17   Aaron Edelman  NUVARING 0.12-0.015 MG/24HR vaginal ring insert 1 ring vaginally for 3 weeks REMOVE for 1 week and repeat Patient not taking: Reported on 09/11/2017 05/29/17   Shelly Bombard, MD  simvastatin (ZOCOR) 10 MG tablet Take 1 tablet (10 mg total) by mouth every other day. Patient not taking: Reported on 07/15/2018 11/24/14   Manya Silvas, CNM    Family History Family History  Problem Relation Age of Onset  . Hypertension Mother   . Diabetes Mother        gestational  . Hypertension Maternal Grandmother   . Hypertension Maternal Grandfather   . Hypertension Paternal Grandmother   . Diabetes Paternal Grandmother     Social History Social History   Tobacco Use  . Smoking status: Former Smoker    Types: Cigarettes    Last attempt to quit: 05/28/2014    Years since quitting: 4.1  . Smokeless tobacco: Never Used  Substance Use Topics  . Alcohol use: No  . Drug use: Yes    Types: Marijuana     Allergies   Patient has no known  allergies.   Review of Systems Review of Systems ROS: Statement: All systems negative except as marked or noted in the HPI; Constitutional: Negative for fever and chills. ; ; Eyes: Negative for eye pain, redness and discharge. ; ; ENMT: Negative for ear pain, hoarseness, nasal congestion, sinus pressure and sore throat. ; ; Cardiovascular: Negative for chest pain, palpitations, diaphoresis, dyspnea and peripheral edema. ; ; Respiratory: Negative for cough, wheezing and stridor. ; ; Gastrointestinal: Negative for nausea, vomiting, diarrhea, abdominal pain, blood in stool, hematemesis, jaundice and rectal bleeding. ; ; Genitourinary: Negative for dysuria,  flank pain and hematuria. ; ; Musculoskeletal: Negative for back pain and neck pain. Negative for swelling and trauma.; ; Skin: Negative for pruritus, rash, abrasions, blisters, bruising and skin lesion.; ; Neuro: +lightheadedness. Negative for headache and neck stiffness. Negative for weakness, altered level of consciousness, altered mental status, extremity weakness, paresthesias, involuntary movement, seizure and syncope.       Physical Exam Updated Vital Signs BP (!) 148/107 (BP Location: Left Arm)   Pulse (!) 129   Temp 97.8 F (36.6 C) (Oral)   Resp 19   Ht '5\' 8"'$  (1.727 m)   Wt 74.4 kg   LMP 06/24/2018   SpO2 100%   BMI 24.94 kg/m    Patient Vitals for the past 24 hrs:  BP Temp Temp src Pulse Resp SpO2 Height Weight  07/15/18 1300 105/67 - - 66 16 100 % - -  07/15/18 1200 110/74 - - 72 17 100 % - -  07/15/18 1100 123/84 - - (!) 110 18 100 % - -  07/15/18 1036 - - - - - - '5\' 8"'$  (1.727 m) 74.4 kg  07/15/18 1035 (!) 148/107 97.8 F (36.6 C) Oral (!) 129 19 100 % - -    11:47:27 Orthostatic Vital Signs BL  Orthostatic Lying   BP- Lying: 118/71   Pulse- Lying: 87       Orthostatic Sitting  BP- Sitting: 115/81   Pulse- Sitting: 89       Orthostatic Standing at 0 minutes  BP- Standing at 0 minutes: 109/91Abnormal      Pulse- Standing at 0 minutes: 114       Orthostatic Standing at 3 minutes  BP- Standing at 3 minutes: 110/81   Pulse- Standing at 3 minutes: 125      Physical Exam 1040: Physical examination:  Nursing notes reviewed; Vital signs and O2 SAT reviewed;  Constitutional: Well developed, Well nourished, In no acute distress. Strong odor of marijuana from pt when I walk into exam room.; Head:  Normocephalic, atraumatic; Eyes: EOMI, PERRL, No scleral icterus. Bilateral conjunctiva mildly injected, no chemosis, no drainage, no obvious hyphema/hypopyon. ; ENMT: Mouth and pharynx normal, Mucous membranes dry; Neck: Supple, Full range of motion, No lymphadenopathy; Cardiovascular: Tachycardic rate and rhythm, No gallop; Respiratory: Breath sounds clear & equal bilaterally, No wheezes.  Speaking full sentences with ease, Normal respiratory effort/excursion; Chest: Nontender, Movement normal; Abdomen: Soft, Nontender, Nondistended, Normal bowel sounds; Genitourinary: No CVA tenderness; Extremities: Peripheral pulses normal, No tenderness, No edema, No calf edema or asymmetry. Left toes with nails lifting, fungal appearance, no erythema, no paronychia.; Neuro: AA&Ox3, Major CN grossly intact.  Speech clear. No gross focal motor or sensory deficits in extremities.; Skin: Color normal, Warm, Dry.    ED Treatments / Results  Labs (all labs ordered are listed, but only abnormal results are displayed)   EKG EKG Interpretation  Date/Time:  Monday July 15 2018 10:32:31 EST Ventricular Rate:  153 PR Interval:    QRS Duration: 75 QT Interval:  281 QTC Calculation: 449 R Axis:   77 Text Interpretation:  Sinus tachycardia Abnormal T, consider ischemia, diffuse leads Baseline wander When compared with ECG of 01/27/2015 Rate faster Confirmed by Francine Graven [phone]) on 07/15/2018 11:08:48 AM      Radiology   Procedures Procedures (including critical care time)  Medications Ordered in  ED Medications  sodium chloride 0.9 % bolus 1,000 mL (0 mLs Intravenous Stopped 07/15/18 1252)     Initial Impression / Assessment and Plan /  ED Course  I have reviewed the triage vital signs and the nursing notes.  Pertinent labs & imaging results that were available during my care of the patient were reviewed by me and considered in my medical decision making (see chart for details).  MDM Reviewed: previous chart, nursing note and vitals Reviewed previous: labs and ECG Interpretation: labs, ECG, x-ray and CT scan    Results for orders placed or performed during the hospital encounter of 07/15/18  Ethanol  Result Value Ref Range   Alcohol, Ethyl (B) <10 <10 mg/dL  Basic metabolic panel  Result Value Ref Range   Sodium 131 (L) 135 - 145 mmol/L   Potassium 4.0 3.5 - 5.1 mmol/L   Chloride 99 98 - 111 mmol/L   CO2 23 22 - 32 mmol/L   Glucose, Bld 361 (H) 70 - 99 mg/dL   BUN 9 6 - 20 mg/dL   Creatinine, Ser 0.65 0.44 - 1.00 mg/dL   Calcium 9.0 8.9 - 10.3 mg/dL   GFR calc non Af Amer >60 >60 mL/min   GFR calc Af Amer >60 >60 mL/min   Anion gap 9 5 - 15  D-dimer, quantitative  Result Value Ref Range   D-Dimer, Quant 0.38 0.00 - 0.50 ug/mL-FEU  CBC with Differential  Result Value Ref Range   WBC 9.4 4.0 - 10.5 K/uL   RBC 5.52 (H) 3.87 - 5.11 MIL/uL   Hemoglobin 13.2 12.0 - 15.0 g/dL   HCT 42.0 36.0 - 46.0 %   MCV 76.1 (L) 80.0 - 100.0 fL   MCH 23.9 (L) 26.0 - 34.0 pg   MCHC 31.4 30.0 - 36.0 g/dL   RDW 13.2 11.5 - 15.5 %   Platelets 242 150 - 400 K/uL   nRBC 0.0 0.0 - 0.2 %   Neutrophils Relative % 67 %   Neutro Abs 6.2 1.7 - 7.7 K/uL   Lymphocytes Relative 26 %   Lymphs Abs 2.4 0.7 - 4.0 K/uL   Monocytes Relative 7 %   Monocytes Absolute 0.7 0.1 - 1.0 K/uL   Eosinophils Relative 0 %   Eosinophils Absolute 0.0 0.0 - 0.5 K/uL   Basophils Relative 0 %   Basophils Absolute 0.0 0.0 - 0.1 K/uL   Immature Granulocytes 0 %   Abs Immature Granulocytes 0.02 0.00 - 0.07 K/uL   Troponin I - Once  Result Value Ref Range   Troponin I <0.03 <0.03 ng/mL  Pregnancy, urine  Result Value Ref Range   Preg Test, Ur NEGATIVE NEGATIVE  Urinalysis, Routine w reflex microscopic  Result Value Ref Range   Color, Urine YELLOW YELLOW   APPearance CLEAR CLEAR   Specific Gravity, Urine 1.043 (H) 1.005 - 1.030   pH 7.0 5.0 - 8.0   Glucose, UA >=500 (A) NEGATIVE mg/dL   Hgb urine dipstick NEGATIVE NEGATIVE   Bilirubin Urine NEGATIVE NEGATIVE   Ketones, ur 20 (A) NEGATIVE mg/dL   Protein, ur NEGATIVE NEGATIVE mg/dL   Nitrite NEGATIVE NEGATIVE   Leukocytes, UA NEGATIVE NEGATIVE   RBC / HPF 0-5 0 - 5 RBC/hpf   WBC, UA 0-5 0 - 5 WBC/hpf   Bacteria, UA NONE SEEN NONE SEEN  Urine rapid drug screen (hosp performed)  Result Value Ref Range   Opiates NONE DETECTED NONE DETECTED   Cocaine NONE DETECTED NONE DETECTED   Benzodiazepines NONE DETECTED NONE DETECTED   Amphetamines NONE DETECTED NONE DETECTED   Tetrahydrocannabinol POSITIVE (A) NONE DETECTED   Barbiturates NONE DETECTED NONE DETECTED  CBG monitoring, ED  Result Value Ref Range   Glucose-Capillary 291 (H) 70 - 99 mg/dL   Dg Chest 2 View Result Date: 07/15/2018 CLINICAL DATA:  Lightheadedness with palpitations for 2 weeks. EXAM: CHEST - 2 VIEW COMPARISON:  Chest x-ray dated 01/27/2015. FINDINGS: Heart size and mediastinal contours are normal. Lungs are clear. No pleural effusion or pneumothorax seen. Scoliosis of the thoracolumbar spine may be accentuated by patient positioning. No acute or suspicious osseous finding. IMPRESSION: 1. No active cardiopulmonary disease. No evidence of pneumonia or pulmonary edema. 2. Scoliosis of the thoracolumbar spine may be due to or accentuated by patient positioning. Electronically Signed   By: Franki Cabot M.D.   On: 07/15/2018 11:29   Ct Head Wo Contrast Result Date: 07/15/2018 CLINICAL DATA:  Dizziness for the past 2 weeks. EXAM: CT HEAD WITHOUT CONTRAST TECHNIQUE: Contiguous axial  images were obtained from the base of the skull through the vertex without intravenous contrast. COMPARISON:  None. FINDINGS: Brain: Gray-white differentiation is maintained. No CT evidence of acute large territory infarct. No intraparenchymal or extra-axial mass or hemorrhage. Normal size and configuration of the ventricles and the basilar cisterns. No midline shift. Vascular: No hyperdense vessel or unexpected calcification. Skull: No displaced calvarial fracture. Sinuses/Orbits: There is underpneumatization the bilateral frontal sinuses. The remaining paranasal sinuses and mastoid air cells are normal. No air-fluid levels. Other: Regional soft tissues appear normal. IMPRESSION: Negative noncontrast head CT. Electronically Signed   By: Sandi Mariscal M.D.   On: 07/15/2018 11:16    1530:   IVF given with improvement in VS and CBG. Not acidotic with normal AG. Pt has tol PO well while in the ED without N/V. Family requesting Podiatry referral (DM foot care) and to speak with CM; will consult.  Dx and testing d/w pt and family.  Questions answered.  Verb understanding, agreeable to d/c home with outpt f/u.    Final Clinical Impressions(s) / ED Diagnoses   Final diagnoses:  None    ED Discharge Orders    None       Francine Graven, DO 07/19/18 1470

## 2018-07-15 NOTE — ED Notes (Signed)
Per ED Pt ok to have food and beverage.

## 2018-07-15 NOTE — ED Notes (Signed)
Patient transported to CT 

## 2018-07-15 NOTE — ED Notes (Signed)
Pt requesting drink and crackers.

## 2018-07-15 NOTE — ED Notes (Signed)
ED Provider at bedside. 

## 2018-07-15 NOTE — ED Triage Notes (Signed)
Pt with c/o dizziness for 2 weeks. She reports being at work "zoning out". Smells of marjuanna states she was in contact with it today but didn't smoke it. Denies LOC. Has nausea but no vomiting. Right foot great toenail that is lifting and painful. Hx of DM type 2 but not taking any meds.

## 2018-07-15 NOTE — ED Notes (Signed)
Nurse Case management called. They are aware of consult will see patient soon.

## 2018-07-15 NOTE — Discharge Instructions (Signed)
Take your usual prescriptions as previously directed. Move slowly when changing positions. Keep yourself hydrated.  Call your regular medical doctor today to schedule a follow up appointment this week. Call a Podiatrist today to schedule a follow up appointment within the next week.  Return to the Emergency Department immediately sooner if worsening.

## 2018-07-31 ENCOUNTER — Encounter (INDEPENDENT_AMBULATORY_CARE_PROVIDER_SITE_OTHER): Payer: Self-pay | Admitting: Physician Assistant

## 2018-07-31 ENCOUNTER — Ambulatory Visit (INDEPENDENT_AMBULATORY_CARE_PROVIDER_SITE_OTHER): Payer: Self-pay | Admitting: Physician Assistant

## 2018-07-31 ENCOUNTER — Other Ambulatory Visit: Payer: Self-pay

## 2018-07-31 VITALS — BP 133/88 | HR 98 | Temp 97.8°F | Ht 68.0 in | Wt 173.0 lb

## 2018-07-31 DIAGNOSIS — L600 Ingrowing nail: Secondary | ICD-10-CM

## 2018-07-31 DIAGNOSIS — E119 Type 2 diabetes mellitus without complications: Secondary | ICD-10-CM

## 2018-07-31 DIAGNOSIS — M797 Fibromyalgia: Secondary | ICD-10-CM

## 2018-07-31 DIAGNOSIS — Z91199 Patient's noncompliance with other medical treatment and regimen due to unspecified reason: Secondary | ICD-10-CM

## 2018-07-31 DIAGNOSIS — F418 Other specified anxiety disorders: Secondary | ICD-10-CM

## 2018-07-31 DIAGNOSIS — B351 Tinea unguium: Secondary | ICD-10-CM

## 2018-07-31 DIAGNOSIS — Z9119 Patient's noncompliance with other medical treatment and regimen: Secondary | ICD-10-CM

## 2018-07-31 LAB — POCT GLYCOSYLATED HEMOGLOBIN (HGB A1C): HEMOGLOBIN A1C: 14.5 % — AB (ref 4.0–5.6)

## 2018-07-31 MED ORDER — HYDROXYZINE HCL 25 MG PO TABS: 25.0000 mg | ORAL_TABLET | Freq: Every day | ORAL | 1 refills | Status: DC

## 2018-07-31 MED ORDER — PEN NEEDLES 31G X 8 MM MISC: 1.0000 "application " | Freq: Every day | 3 refills | Status: DC

## 2018-07-31 MED ORDER — INSULIN GLARGINE 100 UNIT/ML SOLOSTAR PEN: 45.0000 [IU] | PEN_INJECTOR | Freq: Every day | SUBCUTANEOUS | 3 refills | Status: DC

## 2018-07-31 MED ORDER — SITAGLIPTIN PHOS-METFORMIN HCL 50-1000 MG PO TABS: 1.0000 | ORAL_TABLET | Freq: Two times a day (BID) | ORAL | 3 refills | Status: DC

## 2018-07-31 MED ORDER — DULOXETINE HCL 60 MG PO CPEP: 60.0000 mg | ORAL_CAPSULE | Freq: Every day | ORAL | 3 refills | Status: DC

## 2018-07-31 NOTE — Progress Notes (Signed)
Subjective:  Patient ID: Tamara Reyes, female    DOB: 1995/11/27  Age: 22 y.o. MRN: [id number]  CC: hospital f/u   HPI Tamara Reyes is a 22 y.o. female with a medical history of asthma, DM2, fibromyalgia, HSV, HLD, HTN, GAD, chlamydial cervicitis, trichomoniasis, and PCOS presents as a new patient on hospital discharge f/u. Went to ED sixteen days ago. CT head normal. IVF given with improvement in VS and CBG. Diagnosed with lightheadedness. Pt states she drinks plenty of water at four cups per day. Pt noncompliant with DM2 medications. A1c 14.2% today.  Endorses polydipsia, polyuria, visual blurring, tingling, numbness, fatigue. Main complaint is of ingrowing toe nail. She has tried to remove herself. Denies symptoms of infection on her left hallux but could not fully remove the ingrown nail. Requests referral to podiatry.     Outpatient Medications Prior to Visit  Medication Sig Dispense Refill  . ACCU-CHEK FASTCLIX LANCETS MISC Use as directed for blood glucose checks three times daily (Patient taking differently: 4 (four) times a week. Use as directed for blood glucose checks three times daily) 102 each 3  . Blood Glucose Monitoring Suppl (ACCU-CHEK NANO SMARTVIEW) W/DEVICE KIT 1 Device by Does not apply route 3 (three) times daily. (Patient taking differently: 1 Device by Does not apply route 4 (four) times a week. ) 1 kit 0  . citalopram (CELEXA) 20 MG tablet Take 1 tablet (20 mg total) by mouth daily. (Patient taking differently: Take 20 mg by mouth as needed (anxiety or depression). ) 90 tablet 1  . glucose blood (ACCU-CHEK SMARTVIEW) test strip Use as directed for blood glucose checks 3 times daily. (Patient taking differently: 4 (four) times a week. Use as directed for blood glucose checks 3 times daily.) 100 each 3  . Insulin Pen Needle (INSUPEN PEN NEEDLES) 32G X 4 MM MISC Use as directed 50 each 3  . omeprazole-sodium bicarbonate (ZEGERID) 40-1100 MG capsule Take 1 capsule by mouth daily  before breakfast. (Patient taking differently: Take 1 capsule by mouth as needed (acid reflux/heartburn). ) 30 capsule 0  . polyethylene glycol (MIRALAX / GLYCOLAX) packet Take 17 g by mouth as needed for mild constipation.     . cetirizine (ZYRTEC) 10 MG tablet Take 10 mg by mouth as needed for allergies.     . cyclobenzaprine (FLEXERIL) 10 MG tablet Take 1 tablet (10 mg total) by mouth 2 (two) times daily as needed for muscle spasms. 20 tablet 0  . dicyclomine (BENTYL) 20 MG tablet Take 1 tablet (20 mg total) by mouth 2 (two) times daily. 20 tablet 0  . LANTUS SOLOSTAR 100 UNIT/ML Solostar Pen Inject 15 Units into the skin at bedtime as needed (diabetes).   0  . lidocaine (XYLOCAINE) 2 % solution Use as directed 20 mLs in the mouth or throat as needed for mouth pain. (Patient not taking: Reported on 07/15/2018) 100 mL 0  . metFORMIN (GLUCOPHAGE) 500 MG tablet Take 1 tablet (500 mg total) by mouth 2 (two) times daily with a meal. (Patient taking differently: Take 500 mg by mouth daily as needed. ) 60 tablet 2  . naproxen (NAPROSYN) 500 MG tablet Take 1 tablet (500 mg total) by mouth 2 (two) times daily with a meal. (Patient taking differently: Take 500 mg by mouth 2 (two) times daily as needed for mild pain. ) 30 tablet 0  . NUVARING 0.12-0.015 MG/24HR vaginal ring insert 1 ring vaginally for 3 weeks REMOVE for 1 week and repeat (Patient  not taking: Reported on 09/11/2017) 1 each 11  . simvastatin (ZOCOR) 10 MG tablet Take 1 tablet (10 mg total) by mouth every other day. (Patient not taking: Reported on 07/15/2018) 30 tablet 2  . traMADol (ULTRAM) 50 MG tablet Take 1 tablet (50 mg total) by mouth every 6 (six) hours as needed. (Patient taking differently: Take 50 mg by mouth every 6 (six) hours as needed for moderate pain. ) 10 tablet 0  . traZODone (DESYREL) 50 MG tablet Take 1 tablet (50 mg total) by mouth at bedtime. (Patient taking differently: Take 50 mg by mouth as needed for sleep. ) 90 tablet 1    No facility-administered medications prior to visit.      ROS Review of Systems  Constitutional: Positive for malaise/fatigue. Negative for chills and fever.  Eyes: Negative for blurred vision.  Respiratory: Negative for shortness of breath.   Cardiovascular: Negative for chest pain and palpitations.  Gastrointestinal: Negative for abdominal pain and nausea.  Genitourinary: Negative for dysuria and hematuria.  Musculoskeletal: Positive for back pain, joint pain, myalgias and neck pain.  Skin: Negative for rash.       Ingrowing toe nail  Neurological: Negative for tingling and headaches.  Psychiatric/Behavioral: Positive for depression. The patient is nervous/anxious.     Objective:  BP 133/88 (BP Location: Left Arm, Patient Position: Sitting, Cuff Size: Normal)   Pulse 98   Temp 97.8 F (36.6 C) (Oral)   Ht _0  (1.727 m)   Wt 173 lb (78.5 kg)   LMP 07/22/2018 (Exact Date)   SpO2 94%   BMI 26.30 kg/m   BP/Weight 07/31/2018 07/15/2018 2/72/5366  Systolic BP 440 347 425  Diastolic BP 88 72 93  Wt. (Lbs) 173 164 -  BMI 26.3 24.94 -  Some encounter information is confidential and restricted. Go to Review Flowsheets activity to see all data.      Physical Exam Vitals signs reviewed.  Constitutional:      Comments: Well developed, well nourished, NAD, polite  HENT:     Head: Normocephalic and atraumatic.  Eyes:     General: No scleral icterus. Neck:     Musculoskeletal: Normal range of motion and neck supple.     Thyroid: No thyromegaly.  Cardiovascular:     Rate and Rhythm: Normal rate and regular rhythm.     Heart sounds: Normal heart sounds.  Pulmonary:     Effort: Pulmonary effort is normal.     Breath sounds: Normal breath sounds.  Skin:    Comments: Left hallux with partially missing nail. Nail that is present is dystrophic and discolored. Mildly tender to palpation on lateral edge. No erythema, bleeding, suppuration.  Neurological:     Mental Status:  She is alert and oriented to person, place, and time.  Psychiatric:        Behavior: Behavior normal.        Thought Content: Thought content normal.     Comments: Depressed mood      Assessment & Plan:    1. Type 2 diabetes mellitus without complication, without long-term current use of insulin (HCC) - HgB A1c 14.5% - Begin sitaGLIPtin-metformin (JANUMET) 50-1000 MG tablet; Take 1 tablet by mouth 2 (two) times daily with a meal.  Dispense: 60 tablet; Refill: 3 - Begin Insulin Glargine (LANTUS SOLOSTAR) 100 UNIT/ML Solostar Pen; Inject 45 Units into the skin daily.  Dispense: 5 pen; Refill: 3. Pt advised to start at 30 units and increase by one unit daily  until reaching 45 units. She was instructed to check her blood sugar TID and maintain blood sugar between 80-150.  - Begin Insulin Pen Needle (PEN NEEDLES) 31G X 8 MM MISC; Inject 1 application into the skin at bedtime.  Dispense: 100 each; Refill: 3 - Comprehensive metabolic panel - TSH  2. Onychomycosis with ingrown toenail - Ambulatory referral to Podiatry  3. Fibromyalgia - Drug Screen, Urine - Begin DULoxetine (CYMBALTA) 60 MG capsule; Take 1 capsule (60 mg total) by mouth daily.  Dispense: 30 capsule; Refill: 3  4. Depression with anxiety - Begin hydrOXYzine (ATARAX/VISTARIL) 25 MG tablet; Take 1 tablet (25 mg total) by mouth at bedtime.  Dispense: 30 tablet; Refill: 1 - Begin DULoxetine (CYMBALTA) 60 MG capsule; Take 1 capsule (60 mg total) by mouth daily.  Dispense: 30 capsule; Refill: 3 - Stop Citalopram 20 mg. - TSH - Drug Screen, Urine  5. Noncompliance - Drug Screen, Urine - Pt counseled on the importance of taking her medications as directed.      Meds ordered this encounter  Medications  . sitaGLIPtin-metformin (JANUMET) 50-1000 MG tablet    Sig: Take 1 tablet by mouth 2 (two) times daily with a meal.    Dispense:  60 tablet    Refill:  3    Order Specific Question:   Supervising Provider    Answer:    Charlott Rakes [[id number]]  . Insulin Glargine (LANTUS SOLOSTAR) 100 UNIT/ML Solostar Pen    Sig: Inject 45 Units into the skin daily.    Dispense:  5 pen    Refill:  3    Order Specific Question:   Supervising Provider    Answer:   Charlott Rakes [[id number]]  . Insulin Pen Needle (PEN NEEDLES) 31G X 8 MM MISC    Sig: Inject 1 application into the skin at bedtime.    Dispense:  100 each    Refill:  3    Order Specific Question:   Supervising Provider    Answer:   Charlott Rakes [[id number]]  . hydrOXYzine (ATARAX/VISTARIL) 25 MG tablet    Sig: Take 1 tablet (25 mg total) by mouth at bedtime.    Dispense:  30 tablet    Refill:  1    Order Specific Question:   Supervising Provider    Answer:   Charlott Rakes [[id number]]  . DULoxetine (CYMBALTA) 60 MG capsule    Sig: Take 1 capsule (60 mg total) by mouth daily.    Dispense:  30 capsule    Refill:  3    Order Specific Question:   Supervising Provider    Answer:   Charlott Rakes [[id number]]    Follow-up: Return in about 4 weeks (around 08/28/2018) for Depression with anxiety, fibromyalgia.   Clent Demark PA

## 2018-07-31 NOTE — Patient Instructions (Signed)
Diabetes Mellitus and Nutrition, Adult  When you have diabetes (diabetes mellitus), it is very important to have healthy eating habits because your blood sugar (glucose) levels are greatly affected by what you eat and drink. Eating healthy foods in the appropriate amounts, at about the same times every day, can help you:  · Control your blood glucose.  · Lower your risk of heart disease.  · Improve your blood pressure.  · Reach or maintain a healthy weight.  Every person with diabetes is different, and each person has different needs for a meal plan. Your health care provider may recommend that you work with a diet and nutrition specialist (dietitian) to make a meal plan that is best for you. Your meal plan may vary depending on factors such as:  · The calories you need.  · The medicines you take.  · Your weight.  · Your blood glucose, blood pressure, and cholesterol levels.  · Your activity level.  · Other health conditions you have, such as heart or kidney disease.  How do carbohydrates affect me?  Carbohydrates, also called carbs, affect your blood glucose level more than any other type of food. Eating carbs naturally raises the amount of glucose in your blood. Carb counting is a method for keeping track of how many carbs you eat. Counting carbs is important to keep your blood glucose at a healthy level, especially if you use insulin or take certain oral diabetes medicines.  It is important to know how many carbs you can safely have in each meal. This is different for every person. Your dietitian can help you calculate how many carbs you should have at each meal and for each snack.  Foods that contain carbs include:  · Bread, cereal, rice, pasta, and crackers.  · Potatoes and corn.  · Peas, beans, and lentils.  · Milk and yogurt.  · Fruit and juice.  · Desserts, such as cakes, cookies, ice cream, and candy.  How does alcohol affect me?  Alcohol can cause a sudden decrease in blood glucose (hypoglycemia),  especially if you use insulin or take certain oral diabetes medicines. Hypoglycemia can be a life-threatening condition. Symptoms of hypoglycemia (sleepiness, dizziness, and confusion) are similar to symptoms of having too much alcohol.  If your health care provider says that alcohol is safe for you, follow these guidelines:  · Limit alcohol intake to no more than 1 drink per day for nonpregnant women and 2 drinks per day for men. One drink equals 12 oz of beer, 5 oz of wine, or 1½ oz of hard liquor.  · Do not drink on an empty stomach.  · Keep yourself hydrated with water, diet soda, or unsweetened iced tea.  · Keep in mind that regular soda, juice, and other mixers may contain a lot of sugar and must be counted as carbs.  What are tips for following this plan?    Reading food labels  · Start by checking the serving size on the "Nutrition Facts" label of packaged foods and drinks. The amount of calories, carbs, fats, and other nutrients listed on the label is based on one serving of the item. Many items contain more than one serving per package.  · Check the total grams (g) of carbs in one serving. You can calculate the number of servings of carbs in one serving by dividing the total carbs by 15. For example, if a food has 30 g of total carbs, it would be equal to 2   servings of carbs.  · Check the number of grams (g) of saturated and trans fats in one serving. Choose foods that have low or no amount of these fats.  · Check the number of milligrams (mg) of salt (sodium) in one serving. Most people should limit total sodium intake to less than 2,300 mg per day.  · Always check the nutrition information of foods labeled as "low-fat" or "nonfat". These foods may be higher in added sugar or refined carbs and should be avoided.  · Talk to your dietitian to identify your daily goals for nutrients listed on the label.  Shopping  · Avoid buying canned, premade, or processed foods. These foods tend to be high in fat, sodium,  and added sugar.  · Shop around the outside edge of the grocery store. This includes fresh fruits and vegetables, bulk grains, fresh meats, and fresh dairy.  Cooking  · Use low-heat cooking methods, such as baking, instead of high-heat cooking methods like deep frying.  · Cook using healthy oils, such as olive, canola, or sunflower oil.  · Avoid cooking with butter, cream, or high-fat meats.  Meal planning  · Eat meals and snacks regularly, preferably at the same times every day. Avoid going long periods of time without eating.  · Eat foods high in fiber, such as fresh fruits, vegetables, beans, and whole grains. Talk to your dietitian about how many servings of carbs you can eat at each meal.  · Eat 4-6 ounces (oz) of lean protein each day, such as lean meat, chicken, fish, eggs, or tofu. One oz of lean protein is equal to:  ? 1 oz of meat, chicken, or fish.  ? 1 egg.  ? ¼ cup of tofu.  · Eat some foods each day that contain healthy fats, such as avocado, nuts, seeds, and fish.  Lifestyle  · Check your blood glucose regularly.  · Exercise regularly as told by your health care provider. This may include:  ? 150 minutes of moderate-intensity or vigorous-intensity exercise each week. This could be brisk walking, biking, or water aerobics.  ? Stretching and doing strength exercises, such as yoga or weightlifting, at least 2 times a week.  · Take medicines as told by your health care provider.  · Do not use any products that contain nicotine or tobacco, such as cigarettes and e-cigarettes. If you need help quitting, ask your health care provider.  · Work with a counselor or diabetes educator to identify strategies to manage stress and any emotional and social challenges.  Questions to ask a health care provider  · Do I need to meet with a diabetes educator?  · Do I need to meet with a dietitian?  · What number can I call if I have questions?  · When are the best times to check my blood glucose?  Where to find more  information:  · American Diabetes Association: diabetes.org  · Academy of Nutrition and Dietetics: www.eatright.org  · National Institute of Diabetes and Digestive and Kidney Diseases (NIH): www.niddk.nih.gov  Summary  · A healthy meal plan will help you control your blood glucose and maintain a healthy lifestyle.  · Working with a diet and nutrition specialist (dietitian) can help you make a meal plan that is best for you.  · Keep in mind that carbohydrates (carbs) and alcohol have immediate effects on your blood glucose levels. It is important to count carbs and to use alcohol carefully.  This information is not intended to   replace advice given to you by your health care provider. Make sure you discuss any questions you have with your health care provider.  Document Released: 04/27/2005 Document Revised: 02/28/2017 Document Reviewed: 09/04/2016  Elsevier Interactive Patient Education © 2019 [employer]

## 2018-08-01 LAB — DRUG SCREEN, URINE
AMPHETAMINES, URINE: NEGATIVE ng/mL
Barbiturate screen, urine: NEGATIVE ng/mL
Benzodiazepine Quant, Ur: NEGATIVE ng/mL
Cannabinoid Quant, Ur: POSITIVE ng/mL — AB
Cocaine (Metab.): NEGATIVE ng/mL
Opiate Quant, Ur: NEGATIVE ng/mL
PCP Quant, Ur: NEGATIVE ng/mL

## 2018-08-01 LAB — COMPREHENSIVE METABOLIC PANEL
ALT: 12 IU/L (ref 0–32)
AST: 13 IU/L (ref 0–40)
Albumin/Globulin Ratio: 1.4 (ref 1.2–2.2)
Albumin: 4.3 g/dL (ref 3.5–5.5)
Alkaline Phosphatase: 76 IU/L (ref 39–117)
BUN/Creatinine Ratio: 10 (ref 9–23)
BUN: 6 mg/dL (ref 6–20)
Bilirubin Total: 0.7 mg/dL (ref 0.0–1.2)
CO2: 24 mmol/L (ref 20–29)
Calcium: 9.6 mg/dL (ref 8.7–10.2)
Chloride: 98 mmol/L (ref 96–106)
Creatinine, Ser: 0.62 mg/dL (ref 0.57–1.00)
GFR calc Af Amer: 148 mL/min/{1.73_m2} (ref >59)
GFR calc non Af Amer: 128 mL/min/{1.73_m2} (ref >59)
GLUCOSE: 335 mg/dL — AB (ref 65–99)
Globulin, Total: 3 g/dL (ref 1.5–4.5)
Potassium: 4.2 mmol/L (ref 3.5–5.2)
Sodium: 138 mmol/L (ref 134–144)
Total Protein: 7.3 g/dL (ref 6.0–8.5)

## 2018-08-01 LAB — TSH: TSH: 0.422 u[IU]/mL — ABNORMAL LOW (ref 0.450–4.500)

## 2018-08-05 ENCOUNTER — Telehealth (INDEPENDENT_AMBULATORY_CARE_PROVIDER_SITE_OTHER): Payer: Self-pay

## 2018-08-05 NOTE — Telephone Encounter (Signed)
Patient is aware that UDS positive for marijuana. Stop using illegal substances. Take Cymbalta as directed. Liver and kidney results normal. Full thyroid panel at upcoming visit. Nat Christen, CMA

## 2018-08-05 NOTE — Telephone Encounter (Signed)
-----   Message from Clent Demark, PA-C sent at 08/01/2018 12:51 PM EST ----- Positive for marijuana. Must stop using illegal substances. Take cymbalta as directed. Liver and kidney results normal. Very slight abnormality with thyroid test. Check a full thyroid panel at upcoming appointment.

## 2018-08-22 ENCOUNTER — Telehealth (INDEPENDENT_AMBULATORY_CARE_PROVIDER_SITE_OTHER): Payer: Self-pay | Admitting: Physician Assistant

## 2018-08-22 NOTE — Telephone Encounter (Signed)
Patient would like for all prescriptions to printed. States that she now has insurance and would like to take her medication to a store where it can be mor affordable.  Please advice [phone]  Thank you Emmit Pomfret

## 2018-08-22 NOTE — Telephone Encounter (Signed)
FWD to PCP. Therasa Lorenzi S Shantay Sonn, CMA  

## 2018-08-23 NOTE — Telephone Encounter (Signed)
Patient aware that she needs to speak with new provider about prescriptions at next appointment. Until then pick up most important medications like insulin and janumet. Patient stated she will pick up today. Nat Christen, CMA

## 2018-08-23 NOTE — Telephone Encounter (Signed)
She can pick up her rx at her f/u visit next week with her new provider.

## 2018-08-28 MED FILL — !JANUMET 50-1000MG TABLET: 50-1000 | 30 days supply | Qty: 60 | Fill #0

## 2018-08-28 MED FILL — !LANTUS SOLOSTAR 100UNITS/M: 100 | 30 days supply | Qty: 15 | Fill #0

## 2018-10-16 ENCOUNTER — Ambulatory Visit (INDEPENDENT_AMBULATORY_CARE_PROVIDER_SITE_OTHER): Payer: Self-pay | Admitting: Primary Care

## 2018-10-16 ENCOUNTER — Other Ambulatory Visit: Payer: Self-pay

## 2018-10-16 ENCOUNTER — Other Ambulatory Visit (HOSPITAL_COMMUNITY)
Admission: RE | Admit: 2018-10-16 | Discharge: 2018-10-16 | Disposition: A | Discharge status: Home / Self Care | Payer: Medicaid Other | Source: Ambulatory Visit | Attending: Primary Care | Admitting: Primary Care

## 2018-10-16 ENCOUNTER — Encounter (INDEPENDENT_AMBULATORY_CARE_PROVIDER_SITE_OTHER): Payer: Self-pay | Admitting: Primary Care

## 2018-10-16 VITALS — BP 120/73 | HR 88 | Temp 98.1°F | Ht 68.0 in | Wt 181.4 lb

## 2018-10-16 DIAGNOSIS — R739 Hyperglycemia, unspecified: Secondary | ICD-10-CM

## 2018-10-16 DIAGNOSIS — E119 Type 2 diabetes mellitus without complications: Secondary | ICD-10-CM | POA: Insufficient documentation

## 2018-10-16 DIAGNOSIS — Z9119 Patient's noncompliance with other medical treatment and regimen: Secondary | ICD-10-CM

## 2018-10-16 DIAGNOSIS — F418 Other specified anxiety disorders: Secondary | ICD-10-CM

## 2018-10-16 DIAGNOSIS — I10 Essential (primary) hypertension: Secondary | ICD-10-CM

## 2018-10-16 DIAGNOSIS — Z23 Encounter for immunization: Secondary | ICD-10-CM

## 2018-10-16 DIAGNOSIS — Z91199 Patient's noncompliance with other medical treatment and regimen due to unspecified reason: Secondary | ICD-10-CM

## 2018-10-16 DIAGNOSIS — E785 Hyperlipidemia, unspecified: Secondary | ICD-10-CM

## 2018-10-16 DIAGNOSIS — Z114 Encounter for screening for human immunodeficiency virus [HIV]: Secondary | ICD-10-CM

## 2018-10-16 DIAGNOSIS — N898 Other specified noninflammatory disorders of vagina: Secondary | ICD-10-CM

## 2018-10-16 DIAGNOSIS — E1165 Type 2 diabetes mellitus with hyperglycemia: Secondary | ICD-10-CM

## 2018-10-16 LAB — POCT GLYCOSYLATED HEMOGLOBIN (HGB A1C): Hemoglobin A1C: 13 % — AB (ref 4.0–5.6)

## 2018-10-16 LAB — POCT URINALYSIS DIP (CLINITEK)
Bilirubin, UA: NEGATIVE
Blood, UA: NEGATIVE
Glucose, UA: 500 mg/dL — AB
Ketones, POC UA: NEGATIVE mg/dL
Leukocytes, UA: NEGATIVE
Nitrite, UA: NEGATIVE
PH UA: 6.5 (ref 5.0–8.0)
POC PROTEIN,UA: NEGATIVE
Spec Grav, UA: 1.015 (ref 1.010–1.025)
Urobilinogen, UA: 1 E.U./dL

## 2018-10-16 LAB — GLUCOSE, POCT (MANUAL RESULT ENTRY)
POC Glucose: 491 mg/dl — AB (ref 70–99)
POC Glucose: 371 mg/dl — AB (ref 70–99)
POC Glucose: 453 mg/dl — AB (ref 70–99)

## 2018-10-16 MED ORDER — INSULIN ASPART 100 UNIT/ML ~~LOC~~ SOLN
20.0000 [IU] | Freq: Once | SUBCUTANEOUS | Status: AC
  Administered 2018-10-16: 20 [IU] via SUBCUTANEOUS

## 2018-10-16 MED ORDER — LISINOPRIL 5 MG PO TABS: 5.0000 mg | ORAL_TABLET | Freq: Every day | ORAL | 3 refills | Status: DC

## 2018-10-16 MED ORDER — SITAGLIPTIN PHOS-METFORMIN HCL 50-1000 MG PO TABS: 1.0000 | ORAL_TABLET | Freq: Two times a day (BID) | ORAL | 3 refills | Status: DC

## 2018-10-16 MED ORDER — ATORVASTATIN CALCIUM 10 MG PO TABS: 10.0000 mg | ORAL_TABLET | Freq: Every day | ORAL | 3 refills | Status: DC

## 2018-10-16 MED ORDER — INSULIN GLARGINE 100 UNIT/ML SOLOSTAR PEN: 45.0000 [IU] | PEN_INJECTOR | Freq: Every day | SUBCUTANEOUS | 3 refills | Status: DC

## 2018-10-16 MED ORDER — HYDROXYZINE HCL 25 MG PO TABS: 25.0000 mg | ORAL_TABLET | Freq: Every day | ORAL | 1 refills | Status: DC

## 2018-10-16 MED ORDER — DULOXETINE HCL 60 MG PO CPEP: 60.0000 mg | ORAL_CAPSULE | Freq: Every day | ORAL | 3 refills | Status: DC

## 2018-10-16 NOTE — Progress Notes (Signed)
Established Patient Office Visit  Subjective:  Patient ID: Tamara Reyes, female    DOB: 08/28/1995  Age: 23 y.o. MRN: [id number]  CC: No chief complaint on file.   HPI Tamara Reyes presents for follow up on diabetes her CBG was 492 per protocol 20 units of regular will be given and ck urine for glucose. She needs a meter to cheek blood sugars 3-4 times a day. Past medical history of asthma, DM2, fibromyalgia, HLD, HTN, GAD, chlamydial cervicitis X2, trichomoniasis, and PCOS lightheadedness. A1c 13.0 % today.   Past Medical History:  Diagnosis Date  . Asthma   . Diabetes mellitus   . Fibromyalgia   . Genital herpes   . Hypercholesteremia   . Hypertension   . PCOS (polycystic ovarian syndrome)     Past Surgical History:  Procedure Laterality Date  . abscess removal     . TONSILLECTOMY    . TONSILLECTOMY AND ADENOIDECTOMY     tonsils only    Family History  Problem Relation Age of Onset  . Hypertension Mother   . Diabetes Mother        gestational  . Hypertension Maternal Grandmother   . Hypertension Maternal Grandfather   . Hypertension Paternal Grandmother   . Diabetes Paternal Grandmother     Social History   Socioeconomic History  . Marital status: Single    Spouse name: Not on file  . Number of children: Not on file  . Years of education: Not on file  . Highest education level: Not on file  Occupational History  . Not on file  Social Needs  . Financial resource strain: Not on file  . Food insecurity:    Worry: Not on file    Inability: Not on file  . Transportation needs:    Medical: Not on file    Non-medical: Not on file  Tobacco Use  . Smoking status: Former Smoker    Types: Cigarettes    Last attempt to quit: 05/28/2014    Years since quitting: 4.3  . Smokeless tobacco: Never Used  Substance and Sexual Activity  . Alcohol use: No  . Drug use: Yes    Types: Marijuana  . Sexual activity: Yes    Birth control/protection: None    Comment: had  nuva ring, but it is out now  Lifestyle  . Physical activity:    Days per week: Not on file    Minutes per session: Not on file  . Stress: Not on file  Relationships  . Social connections:    Talks on phone: Not on file    Gets together: Not on file    Attends religious service: Not on file    Active member of club or organization: Not on file    Attends meetings of clubs or organizations: Not on file    Relationship status: Not on file  . Intimate partner violence:    Fear of current or ex partner: Not on file    Emotionally abused: Not on file    Physically abused: Not on file    Forced sexual activity: Not on file  Other Topics Concern  . Not on file  Social History Narrative   Is in 12th grade at [employer] with mom    Outpatient Medications Prior to Visit  Medication Sig Dispense Refill  . Insulin Pen Needle (PEN NEEDLES) 31G X 8 MM MISC Inject 1 application into the skin at bedtime. 100 each 3  .  DULoxetine (CYMBALTA) 60 MG capsule Take 1 capsule (60 mg total) by mouth daily. 30 capsule 3  . hydrOXYzine (ATARAX/VISTARIL) 25 MG tablet Take 1 tablet (25 mg total) by mouth at bedtime. 30 tablet 1  . Insulin Glargine (LANTUS SOLOSTAR) 100 UNIT/ML Solostar Pen Inject 45 Units into the skin daily. 5 pen 3  . sitaGLIPtin-metformin (JANUMET) 50-1000 MG tablet Take 1 tablet by mouth 2 (two) times daily with a meal. 60 tablet 3  . polyethylene glycol (MIRALAX / GLYCOLAX) packet Take 17 g by mouth as needed for mild constipation.      No facility-administered medications prior to visit.     No Known Allergies  ROS Review of Systems  Constitutional: Negative.   HENT: Positive for dental problem.   Eyes: Positive for visual disturbance.       Eye exam January  with new prescription for glasses  Respiratory: Negative.   Cardiovascular: Negative.   Gastrointestinal: Negative.   Endocrine: Positive for polydipsia, polyphagia and polyuria.  Genitourinary: Positive  for frequency.  Musculoskeletal: Positive for back pain.  Skin: Negative.   Allergic/Immunologic: Negative.   Neurological: Positive for light-headedness.  Hematological: Negative.   Psychiatric/Behavioral: Positive for agitation.      Objective:    Physical Exam  Constitutional: She is oriented to person, place, and time. She appears well-developed and well-nourished.  Neck: Normal range of motion.  Cardiovascular: Normal rate and regular rhythm.  Pulmonary/Chest: Effort normal and breath sounds normal.  Abdominal: Soft. Bowel sounds are normal.  Musculoskeletal: Normal range of motion.  Neurological: She is alert and oriented to person, place, and time.  Skin: Skin is warm and dry.  Psychiatric: She has a normal mood and affect.    BP 120/73 (BP Location: Left Arm, Patient Position: Sitting, Cuff Size: Normal)   Pulse 88   Temp 98.1 F (36.7 C) (Oral)   Ht 5\' 8"  (1.727 m)   Wt 181 lb 6.4 oz (82.3 kg)   SpO2 99%   BMI 27.58 kg/m  Wt Readings from Last 3 Encounters:  10/16/18 181 lb 6.4 oz (82.3 kg)  07/31/18 173 lb (78.5 kg)  07/15/18 164 lb (74.4 kg)     Health Maintenance Due  Topic Date Due  . FOOT EXAM  03/25/2006  . OPHTHALMOLOGY EXAM  03/25/2006  . URINE MICROALBUMIN  03/25/2006  . TETANUS/TDAP  03/26/2015  . PAP-Cervical Cytology Screening  03/25/2017  . PAP SMEAR-Modifier  03/25/2017  . CHLAMYDIA SCREENING  05/28/2017    There are no preventive care reminders to display for this patient.  Lab Results  Component Value Date   TSH 0.422 (L) 07/31/2018   Lab Results  Component Value Date   WBC 9.4 07/15/2018   HGB 13.2 07/15/2018   HCT 42.0 07/15/2018   MCV 76.1 (L) 07/15/2018   PLT 242 07/15/2018   Lab Results  Component Value Date   NA 138 07/31/2018   K 4.2 07/31/2018   CO2 24 07/31/2018   GLUCOSE 335 (H) 07/31/2018   BUN 6 07/31/2018   CREATININE 0.62 07/31/2018   BILITOT 0.7 07/31/2018   ALKPHOS 76 07/31/2018   AST 13 07/31/2018    ALT 12 07/31/2018   PROT 7.3 07/31/2018   ALBUMIN 4.3 07/31/2018   CALCIUM 9.6 07/31/2018   ANIONGAP 9 07/15/2018   No results found for: CHOL No results found for: HDL No results found for: LDLCALC No results found for: TRIG No results found for: [facility] Lab Results  Component Value Date  HGBA1C 13.0 (A) 10/16/2018      Assessment & Plan:   Problem List Items Addressed This Visit    Hyperlipidemia   Hypertension   Hyperglycemia    Other Visit Diagnoses    Type 2 diabetes mellitus without complication, without long-term current use of insulin (HCC)    -  Primary   Relevant Medications   insulin aspart (novoLOG) injection 20 Units (Completed) (Start on 10/16/2018  5:00 PM)   Insulin Glargine (LANTUS SOLOSTAR) 100 UNIT/ML Solostar Pen   sitaGLIPtin-metformin (JANUMET) 50-1000 MG tablet   Other Relevant Orders   Microalbumin/Creatinine Ratio, Urine   HgB A1c (Completed)   Glucose (CBG) (Completed)   POCT URINALYSIS DIP (CLINITEK) (Completed)   Depression with anxiety       Relevant Medications   DULoxetine (CYMBALTA) 60 MG capsule   hydrOXYzine (ATARAX/VISTARIL) 25 MG tablet   Noncompliance          Meds ordered this encounter  Medications  . insulin aspart (novoLOG) injection 20 Units  . DULoxetine (CYMBALTA) 60 MG capsule    Sig: Take 1 capsule (60 mg total) by mouth daily.    Dispense:  30 capsule    Refill:  3  . hydrOXYzine (ATARAX/VISTARIL) 25 MG tablet    Sig: Take 1 tablet (25 mg total) by mouth at bedtime.    Dispense:  30 tablet    Refill:  1  . Insulin Glargine (LANTUS SOLOSTAR) 100 UNIT/ML Solostar Pen    Sig: Inject 45 Units into the skin daily.    Dispense:  5 pen    Refill:  3  . sitaGLIPtin-metformin (JANUMET) 50-1000 MG tablet    Sig: Take 1 tablet by mouth 2 (two) times daily with a meal.    Dispense:  60 tablet    Refill:  3  1. Type 2 diabetes mellitus without complication, without long-term current use of insulin (HCC)  -  Microalbumin/Creatinine Ratio, Urine - HgB A1c - Glucose (CBG) - POCT URINALYSIS DIP (CLINITEK) - insulin aspart (novoLOG) injection 20 Units - Insulin Glargine (LANTUS SOLOSTAR) 100 UNIT/ML Solostar Pen; Inject 45 Units into the skin daily.  Dispense: 5 pen; Refill: 3 - sitaGLIPtin-metformin (JANUMET) 50-1000 MG tablet; Take 1 tablet by mouth 2 (two) times daily with a meal.  Dispense: 60 tablet; Refill: 3  2. Hyperglycemia Refer to endocrinologist   3. Essential hypertension Placed on low dose Ace for renal protection   4. Hyperlipidemia, unspecified hyperlipidemia type Place on a low dose   5. Depression with anxiety - DULoxetine (CYMBALTA) 60 MG capsule; Take 1 capsule (60 mg total) by mouth daily.  Dispense: 30 capsule; Refill: 3 - hydrOXYzine (ATARAX/VISTARIL) 25 MG tablet; Take 1 tablet (25 mg total) by mouth at bedtime.  Dispense: 30 tablet; Refill: 1  6. Noncompliance   Follow-up: Return in about 3 months (around 01/16/2019) for follow up on DM.    Kerin Perna, NP

## 2018-10-17 LAB — MICROALBUMIN / CREATININE URINE RATIO
Creatinine, Urine: 40.1 mg/dL
Microalb/Creat Ratio: <7 mg/g creat (ref 0–29)
Microalbumin, Urine: <3 ug/mL

## 2018-10-17 LAB — URINE CYTOLOGY ANCILLARY ONLY
Chlamydia: NEGATIVE
Neisseria Gonorrhea: NEGATIVE
Trichomonas: NEGATIVE

## 2018-10-19 LAB — URINE CYTOLOGY ANCILLARY ONLY: Candida vaginitis: NEGATIVE

## 2018-10-21 ENCOUNTER — Other Ambulatory Visit (INDEPENDENT_AMBULATORY_CARE_PROVIDER_SITE_OTHER): Payer: Self-pay | Admitting: Primary Care

## 2018-10-21 MED ORDER — METRONIDAZOLE 500 MG PO TABS: 500.0000 mg | ORAL_TABLET | Freq: Three times a day (TID) | ORAL | 0 refills | Status: AC

## 2018-10-25 ENCOUNTER — Telehealth (INDEPENDENT_AMBULATORY_CARE_PROVIDER_SITE_OTHER): Payer: Self-pay

## 2018-10-25 NOTE — Telephone Encounter (Signed)
Patient is aware that microalbumin normal. STD panel negative. Pap normal except positive for BV, antibiotic flagyl sent to pharmacy. Advised patient to not drink any alcohol while taking the abx. Nat Christen, CMA

## 2018-10-25 NOTE — Telephone Encounter (Signed)
-----   Message from Kerin Perna, NP sent at 10/18/2018  8:33 AM EST ----- Inform pt STD testing all neg and microalbumin unremarkable

## 2018-11-06 ENCOUNTER — Ambulatory Visit (INDEPENDENT_AMBULATORY_CARE_PROVIDER_SITE_OTHER): Payer: Self-pay | Admitting: Primary Care

## 2018-12-26 ENCOUNTER — Ambulatory Visit: Payer: Self-pay | Attending: Primary Care | Admitting: Primary Care

## 2018-12-26 ENCOUNTER — Other Ambulatory Visit: Payer: Self-pay

## 2019-08-15 DIAGNOSIS — Z87898 Personal history of other specified conditions: Secondary | ICD-10-CM

## 2019-08-15 DIAGNOSIS — F1291 Cannabis use, unspecified, in remission: Secondary | ICD-10-CM

## 2019-08-15 HISTORY — DX: Cannabis use, unspecified, in remission: F12.91

## 2019-08-15 HISTORY — DX: Personal history of other specified conditions: Z87.898

## 2019-09-05 ENCOUNTER — Ambulatory Visit: Payer: Medicaid Other | Attending: Internal Medicine

## 2019-09-05 DIAGNOSIS — Z20822 Contact with and (suspected) exposure to covid-19: Secondary | ICD-10-CM | POA: Diagnosis not present

## 2019-09-06 LAB — NOVEL CORONAVIRUS, NAA: SARS-CoV-2, NAA: NOT DETECTED

## 2019-09-08 ENCOUNTER — Encounter (INDEPENDENT_AMBULATORY_CARE_PROVIDER_SITE_OTHER): Payer: Self-pay | Admitting: Primary Care

## 2019-09-08 ENCOUNTER — Ambulatory Visit (INDEPENDENT_AMBULATORY_CARE_PROVIDER_SITE_OTHER): Payer: Self-pay | Admitting: Primary Care

## 2019-09-08 ENCOUNTER — Other Ambulatory Visit: Payer: Self-pay

## 2019-09-08 VITALS — BP 134/86 | HR 87 | Temp 97.2°F | Ht 68.0 in | Wt 168.6 lb

## 2019-09-08 DIAGNOSIS — E119 Type 2 diabetes mellitus without complications: Secondary | ICD-10-CM

## 2019-09-08 DIAGNOSIS — E785 Hyperlipidemia, unspecified: Secondary | ICD-10-CM

## 2019-09-08 DIAGNOSIS — F418 Other specified anxiety disorders: Secondary | ICD-10-CM

## 2019-09-08 DIAGNOSIS — I10 Essential (primary) hypertension: Secondary | ICD-10-CM

## 2019-09-08 LAB — POCT GLYCOSYLATED HEMOGLOBIN (HGB A1C): Hemoglobin A1C: 13.1 % — AB (ref 4.0–5.6)

## 2019-09-08 LAB — GLUCOSE, POCT (MANUAL RESULT ENTRY): POC Glucose: 288 mg/dl — AB (ref 70–99)

## 2019-09-08 MED ORDER — LISINOPRIL 5 MG PO TABS: 5.0000 mg | ORAL_TABLET | Freq: Every day | ORAL | 1 refills | Status: DC

## 2019-09-08 MED ORDER — ESCITALOPRAM OXALATE 10 MG PO TABS: 10.0000 mg | ORAL_TABLET | Freq: Every day | ORAL | 1 refills | Status: DC

## 2019-09-08 MED ORDER — JANUMET 50-1000 MG PO TABS: 1.0000 | ORAL_TABLET | Freq: Two times a day (BID) | ORAL | 1 refills | Status: DC

## 2019-09-08 MED ORDER — PEN NEEDLES 31G X 8 MM MISC: 1.0000 "application " | Freq: Every day | 3 refills | Status: DC

## 2019-09-08 MED ORDER — HYDROXYZINE HCL 25 MG PO TABS: 25.0000 mg | ORAL_TABLET | Freq: Every day | ORAL | 1 refills | Status: DC

## 2019-09-08 MED ORDER — LANTUS SOLOSTAR 100 UNIT/ML ~~LOC~~ SOPN: 45.0000 [IU] | PEN_INJECTOR | Freq: Every day | SUBCUTANEOUS | 3 refills | Status: DC

## 2019-09-08 MED ORDER — ATORVASTATIN CALCIUM 10 MG PO TABS: 10.0000 mg | ORAL_TABLET | Freq: Every day | ORAL | 1 refills | Status: DC

## 2019-09-08 MED FILL — LISINOPRIL 5 MG TABLET: 5 | 30 days supply | Qty: 30 | Fill #0

## 2019-09-08 MED FILL — hydrOXYzine HCL 25 MG TABS: 25 | 30 days supply | Qty: 30 | Fill #0

## 2019-09-08 MED FILL — !LANTUS SOLOSTAR 100UNITS/M: 100 | 26 days supply | Qty: 12 | Fill #0

## 2019-09-08 MED FILL — TRUEPLUS 5-BEVEL PEN NEEDLE: 31G X 5 MM | 100 days supply | Qty: 100 | Fill #0

## 2019-09-08 MED FILL — ATORVASTATIN 10 MG TABLET: 10 | 30 days supply | Qty: 30 | Fill #0

## 2019-09-08 MED FILL — ESCITALOPRAM 10 MG TABLET: 10 | 30 days supply | Qty: 30 | Fill #0

## 2019-09-08 MED FILL — JANUMET 50-1,000 MG TABLET: 50-1000 | 30 days supply | Qty: 60 | Fill #0

## 2019-09-08 NOTE — Patient Instructions (Signed)
Depression can affect thoughts, feelings & sleep. Can lead to changes in mood ,relationships, daily activities & physical health, caused by interference in brain chemicals.  Diabetes Mellitus and Nutrition, Adult When you have diabetes (diabetes mellitus), it is very important to have healthy eating habits because your blood sugar (glucose) levels are greatly affected by what you eat and drink. Eating healthy foods in the appropriate amounts, at about the same times every day, can help you:  Control your blood glucose.  Lower your risk of heart disease.  Improve your blood pressure.  Reach or maintain a healthy weight. Every person with diabetes is different, and each person has different needs for a meal plan. Your health care provider may recommend that you work with a diet and nutrition specialist (dietitian) to make a meal plan that is best for you. Your meal plan may vary depending on factors such as:  The calories you need.  The medicines you take.  Your weight.  Your blood glucose, blood pressure, and cholesterol levels.  Your activity level.  Other health conditions you have, such as heart or kidney disease. How do carbohydrates affect me? Carbohydrates, also called carbs, affect your blood glucose level more than any other type of food. Eating carbs naturally raises the amount of glucose in your blood. Carb counting is a method for keeping track of how many carbs you eat. Counting carbs is important to keep your blood glucose at a healthy level, especially if you use insulin or take certain oral diabetes medicines. It is important to know how many carbs you can safely have in each meal. This is different for every person. Your dietitian can help you calculate how many carbs you should have at each meal and for each snack. Foods that contain carbs include:  Bread, cereal, rice, pasta, and crackers.  Potatoes and corn.  Peas, beans, and lentils.  Milk and yogurt.  Fruit  and juice.  Desserts, such as cakes, cookies, ice cream, and candy. How does alcohol affect me? Alcohol can cause a sudden decrease in blood glucose (hypoglycemia), especially if you use insulin or take certain oral diabetes medicines. Hypoglycemia can be a life-threatening condition. Symptoms of hypoglycemia (sleepiness, dizziness, and confusion) are similar to symptoms of having too much alcohol. If your health care provider says that alcohol is safe for you, follow these guidelines:  Limit alcohol intake to no more than 1 drink per day for nonpregnant women and 2 drinks per day for men. One drink equals 12 oz of beer, 5 oz of wine, or 1 oz of hard liquor.  Do not drink on an empty stomach.  Keep yourself hydrated with water, diet soda, or unsweetened iced tea.  Keep in mind that regular soda, juice, and other mixers may contain a lot of sugar and must be counted as carbs. What are tips for following this plan?  Reading food labels  Start by checking the serving size on the "Nutrition Facts" label of packaged foods and drinks. The amount of calories, carbs, fats, and other nutrients listed on the label is based on one serving of the item. Many items contain more than one serving per package.  Check the total grams (g) of carbs in one serving. You can calculate the number of servings of carbs in one serving by dividing the total carbs by 15. For example, if a food has 30 g of total carbs, it would be equal to 2 servings of carbs.  Check the number of  grams (g) of saturated and trans fats in one serving. Choose foods that have low or no amount of these fats.  Check the number of milligrams (mg) of salt (sodium) in one serving. Most people should limit total sodium intake to less than 2,300 mg per day.  Always check the nutrition information of foods labeled as "low-fat" or "nonfat". These foods may be higher in added sugar or refined carbs and should be avoided.  Talk to your dietitian  to identify your daily goals for nutrients listed on the label. Shopping  Avoid buying canned, premade, or processed foods. These foods tend to be high in fat, sodium, and added sugar.  Shop around the outside edge of the grocery store. This includes fresh fruits and vegetables, bulk grains, fresh meats, and fresh dairy. Cooking  Use low-heat cooking methods, such as baking, instead of high-heat cooking methods like deep frying.  Cook using healthy oils, such as olive, canola, or sunflower oil.  Avoid cooking with butter, cream, or high-fat meats. Meal planning  Eat meals and snacks regularly, preferably at the same times every day. Avoid going long periods of time without eating.  Eat foods high in fiber, such as fresh fruits, vegetables, beans, and whole grains. Talk to your dietitian about how many servings of carbs you can eat at each meal.  Eat 4-6 ounces (oz) of lean protein each day, such as lean meat, chicken, fish, eggs, or tofu. One oz of lean protein is equal to: ? 1 oz of meat, chicken, or fish. ? 1 egg. ?  cup of tofu.  Eat some foods each day that contain healthy fats, such as avocado, nuts, seeds, and fish. Lifestyle  Check your blood glucose regularly.  Exercise regularly as told by your health care provider. This may include: ? 150 minutes of moderate-intensity or vigorous-intensity exercise each week. This could be brisk walking, biking, or water aerobics. ? Stretching and doing strength exercises, such as yoga or weightlifting, at least 2 times a week.  Take medicines as told by your health care provider.  Do not use any products that contain nicotine or tobacco, such as cigarettes and e-cigarettes. If you need help quitting, ask your health care provider.  Work with a Social worker or diabetes educator to identify strategies to manage stress and any emotional and social challenges. Questions to ask a health care provider  Do I need to meet with a diabetes  educator?  Do I need to meet with a dietitian?  What number can I call if I have questions?  When are the best times to check my blood glucose? Where to find more information:  American Diabetes Association: diabetes.org  Academy of Nutrition and Dietetics: www.eatright.[employer] of Diabetes and Digestive and Kidney Diseases (NIH): [employer] Summary  A healthy meal plan will help you control your blood glucose and maintain a healthy lifestyle.  Working with a diet and nutrition specialist (dietitian) can help you make a meal plan that is best for you.  Keep in mind that carbohydrates (carbs) and alcohol have immediate effects on your blood glucose levels. It is important to count carbs and to use alcohol carefully. This information is not intended to replace advice given to you by your health care provider. Make sure you discuss any questions you have with your health care provider. Document Revised: 07/13/2017 Document Reviewed: 09/04/2016 Elsevier Patient Education  2020 [employer].

## 2019-09-08 NOTE — Progress Notes (Signed)
Established Patient Office Visit  Subjective:  Patient ID: Tamara Reyes, female    DOB: 26-Sep-1995  Age: 24 y.o. MRN: [id number]  CC:  Chief Complaint  Patient presents with  . Diabetes    HPI Tamara Reyes presents for management of diabetes non compliant with medications   Past Medical History:  Diagnosis Date  . Asthma   . Diabetes mellitus   . Fibromyalgia   . Genital herpes   . Hypercholesteremia   . Hypertension   . PCOS (polycystic ovarian syndrome)     Past Surgical History:  Procedure Laterality Date  . abscess removal     . TONSILLECTOMY    . TONSILLECTOMY AND ADENOIDECTOMY     tonsils only    Family History  Problem Relation Age of Onset  . Hypertension Mother   . Diabetes Mother        gestational  . Hypertension Maternal Grandmother   . Hypertension Maternal Grandfather   . Hypertension Paternal Grandmother   . Diabetes Paternal Grandmother     Social History   Socioeconomic History  . Marital status: Single    Spouse name: Not on file  . Number of children: Not on file  . Years of education: Not on file  . Highest education level: Not on file  Occupational History  . Not on file  Tobacco Use  . Smoking status: Former Smoker    Types: Cigarettes    Quit date: 05/28/2014    Years since quitting: 5.2  . Smokeless tobacco: Never Used  Substance and Sexual Activity  . Alcohol use: No  . Drug use: Yes    Types: Marijuana  . Sexual activity: Yes    Birth control/protection: None    Comment: had nuva ring, but it is out now  Other Topics Concern  . Not on file  Social History Narrative   Is in 12th grade at [employer] with mom   Social Determinants of Health   Financial Resource Strain:   . Difficulty of Paying Living Expenses: Not on file  Food Insecurity:   . Worried About Charity fundraiser in the Last Year: Not on file  . Ran Out of Food in the Last Year: Not on file  Transportation Needs:   . Lack of Transportation  (Medical): Not on file  . Lack of Transportation (Non-Medical): Not on file  Physical Activity:   . Days of Exercise per Week: Not on file  . Minutes of Exercise per Session: Not on file  Stress:   . Feeling of Stress : Not on file  Social Connections:   . Frequency of Communication with Friends and Family: Not on file  . Frequency of Social Gatherings with Friends and Family: Not on file  . Attends Religious Services: Not on file  . Active Member of Clubs or Organizations: Not on file  . Attends Archivist Meetings: Not on file  . Marital Status: Not on file  Intimate Partner Violence:   . Fear of Current or Ex-Partner: Not on file  . Emotionally Abused: Not on file  . Physically Abused: Not on file  . Sexually Abused: Not on file    Outpatient Medications Prior to Visit  Medication Sig Dispense Refill  . polyethylene glycol (MIRALAX / GLYCOLAX) packet Take 17 g by mouth as needed for mild constipation.     Marland Kitchen atorvastatin (LIPITOR) 10 MG tablet Take 1 tablet (10 mg total) by mouth daily. (Patient not  taking: Reported on 09/08/2019) 90 tablet 3  . DULoxetine (CYMBALTA) 60 MG capsule Take 1 capsule (60 mg total) by mouth daily. (Patient not taking: Reported on 09/08/2019) 30 capsule 3  . hydrOXYzine (ATARAX/VISTARIL) 25 MG tablet Take 1 tablet (25 mg total) by mouth at bedtime. (Patient not taking: Reported on 09/08/2019) 30 tablet 1  . Insulin Glargine (LANTUS SOLOSTAR) 100 UNIT/ML Solostar Pen Inject 45 Units into the skin daily. (Patient not taking: Reported on 09/08/2019) 5 pen 3  . Insulin Pen Needle (PEN NEEDLES) 31G X 8 MM MISC Inject 1 application into the skin at bedtime. (Patient not taking: Reported on 09/08/2019) 100 each 3  . lisinopril (PRINIVIL,ZESTRIL) 5 MG tablet Take 1 tablet (5 mg total) by mouth daily. (Patient not taking: Reported on 09/08/2019) 90 tablet 3  . sitaGLIPtin-metformin (JANUMET) 50-1000 MG tablet Take 1 tablet by mouth 2 (two) times daily with a  meal. (Patient not taking: Reported on 09/08/2019) 60 tablet 3   No facility-administered medications prior to visit.    No Known Allergies  ROS Review of Systems  Endocrine: Positive for polydipsia, polyphagia and polyuria.  All other systems reviewed and are negative.     Objective:    Physical Exam  Constitutional: She is oriented to person, place, and time. She appears well-developed and well-nourished.  HENT:  Head: Normocephalic.  Cardiovascular: Normal rate and regular rhythm.  Pulmonary/Chest: Effort normal and breath sounds normal.  Abdominal: Soft. Bowel sounds are normal.  Musculoskeletal:        General: Normal range of motion.     Cervical back: Normal range of motion.  Neurological: She is oriented to person, place, and time.  Skin: Skin is warm and dry.  Psychiatric: She has a normal mood and affect. Her behavior is normal. Judgment and thought content normal.    BP 134/86 (BP Location: Right Arm, Patient Position: Sitting, Cuff Size: Normal)   Pulse 87   Temp (!) 97.2 F (36.2 C) (Temporal)   Ht '5\' 8"'$  (1.727 m)   Wt 168 lb 9.6 oz (76.5 kg)   LMP 09/07/2019 (Exact Date)   SpO2 99%   BMI 25.64 kg/m  Wt Readings from Last 3 Encounters:  09/08/19 168 lb 9.6 oz (76.5 kg)  10/16/18 181 lb 6.4 oz (82.3 kg)  07/31/18 173 lb (78.5 kg)     Health Maintenance Due  Topic Date Due  . OPHTHALMOLOGY EXAM  03/25/2006  . PAP-Cervical Cytology Screening  03/25/2017  . PAP SMEAR-Modifier  03/25/2017  . CHLAMYDIA SCREENING  05/28/2017    There are no preventive care reminders to display for this patient.  Lab Results  Component Value Date   TSH 0.422 (L) 07/31/2018   Lab Results  Component Value Date   WBC 8.6 09/08/2019   HGB 13.3 09/08/2019   HCT 40.6 09/08/2019   MCV 78 (L) 09/08/2019   PLT 239 09/08/2019   Lab Results  Component Value Date   NA 137 09/08/2019   K 4.1 09/08/2019   CO2 23 09/08/2019   GLUCOSE 313 (H) 09/08/2019   BUN 7  09/08/2019   CREATININE 0.82 09/08/2019   BILITOT 0.4 09/08/2019   ALKPHOS 64 09/08/2019   AST 10 09/08/2019   ALT 8 09/08/2019   PROT 6.7 09/08/2019   ALBUMIN 4.4 09/08/2019   CALCIUM 9.4 09/08/2019   ANIONGAP 9 07/15/2018   Lab Results  Component Value Date   CHOL 219 (H) 09/08/2019   Lab Results  Component Value Date  HDL 45 09/08/2019   Lab Results  Component Value Date   LDLCALC 149 (H) 09/08/2019   Lab Results  Component Value Date   TRIG 137 09/08/2019   Lab Results  Component Value Date   CHOLHDL 4.9 (H) 09/08/2019   Lab Results  Component Value Date   HGBA1C 13.1 (A) 09/08/2019      Assessment & Plan:  Tamara Reyes was seen today for diabetes.  Diagnoses and all orders for this visit:  Type 2 diabetes mellitus without complication, without long-term current use of insulin (HCC) -     HgB A1c -     Glucose (CBG) -     Lipid Panel -     Discontinue: sitaGLIPtin-metformin (JANUMET) 50-1000 MG tablet; Take 1 tablet by mouth 2 (two) times daily with a meal. -     Discontinue: Insulin Glargine (LANTUS SOLOSTAR) 100 UNIT/ML Solostar Pen; Inject 45 Units into the skin daily. -     Ambulatory referral to Ophthalmology -     Insulin Glargine (LANTUS SOLOSTAR) 100 UNIT/ML Solostar Pen; Inject 45 Units into the skin daily. -     sitaGLIPtin-metformin (JANUMET) 50-1000 MG tablet; Take 1 tablet by mouth 2 (two) times daily with a meal. -     Insulin Pen Needle (PEN NEEDLES) 31G X 8 MM MISC; Inject 1 application into the skin at bedtime.  Essential hypertension -     Cancel: CBC with Differential -     Cancel: CMP14+EGFR; Future -     Discontinue: lisinopril (ZESTRIL) 5 MG tablet; Take 1 tablet (5 mg total) by mouth daily. -     CBC with Differential -     CMP14+EGFR -     lisinopril (ZESTRIL) 5 MG tablet; Take 1 tablet (5 mg total) by mouth daily.  Hyperlipidemia, unspecified hyperlipidemia type -     Discontinue: atorvastatin (LIPITOR) 10 MG tablet; Take 1 tablet  (10 mg total) by mouth daily. -     atorvastatin (LIPITOR) 10 MG tablet; Take 1 tablet (10 mg total) by mouth daily.  Depression with anxiety On hydrOXYzine anxiety improved but depression not managed as well. only Will add lexapro '10mg'$  to help with thoughts and feelings down/depressed relationships, daily activities, and physical health.  -     hydrOXYzine (ATARAX/VISTARIL) 25 MG tablet; Take 1 tablet (25 mg total) by mouth at bedtime.  Other orders -    -     escitalopram (LEXAPRO) 10 MG tablet; Take 1 tablet (10 mg total) by mouth daily.     Meds ordered this encounter  Medications  . DISCONTD: sitaGLIPtin-metformin (JANUMET) 50-1000 MG tablet    Sig: Take 1 tablet by mouth 2 (two) times daily with a meal.    Dispense:  180 tablet    Refill:  1  . DISCONTD: atorvastatin (LIPITOR) 10 MG tablet    Sig: Take 1 tablet (10 mg total) by mouth daily.    Dispense:  90 tablet    Refill:  1  . DISCONTD: Insulin Glargine (LANTUS SOLOSTAR) 100 UNIT/ML Solostar Pen    Sig: Inject 45 Units into the skin daily.    Dispense:  5 pen    Refill:  3  . DISCONTD: hydrOXYzine (ATARAX/VISTARIL) 25 MG tablet    Sig: Take 1 tablet (25 mg total) by mouth at bedtime.    Dispense:  30 tablet    Refill:  1  . DISCONTD: lisinopril (ZESTRIL) 5 MG tablet    Sig: Take 1 tablet (5 mg total)  by mouth daily.    Dispense:  90 tablet    Refill:  1  . DISCONTD: escitalopram (LEXAPRO) 10 MG tablet    Sig: Take 1 tablet (10 mg total) by mouth daily.    Dispense:  90 tablet    Refill:  1  . hydrOXYzine (ATARAX/VISTARIL) 25 MG tablet    Sig: Take 1 tablet (25 mg total) by mouth at bedtime.    Dispense:  30 tablet    Refill:  1  . Insulin Glargine (LANTUS SOLOSTAR) 100 UNIT/ML Solostar Pen    Sig: Inject 45 Units into the skin daily.    Dispense:  5 pen    Refill:  3  . lisinopril (ZESTRIL) 5 MG tablet    Sig: Take 1 tablet (5 mg total) by mouth daily.    Dispense:  90 tablet    Refill:  1  .  sitaGLIPtin-metformin (JANUMET) 50-1000 MG tablet    Sig: Take 1 tablet by mouth 2 (two) times daily with a meal.    Dispense:  180 tablet    Refill:  1  . Insulin Pen Needle (PEN NEEDLES) 31G X 8 MM MISC    Sig: Inject 1 application into the skin at bedtime.    Dispense:  100 each    Refill:  3  . escitalopram (LEXAPRO) 10 MG tablet    Sig: Take 1 tablet (10 mg total) by mouth daily.    Dispense:  90 tablet    Refill:  1  . atorvastatin (LIPITOR) 10 MG tablet    Sig: Take 1 tablet (10 mg total) by mouth daily.    Dispense:  90 tablet    Refill:  1    Follow-up: Return in about 4 weeks (around 10/06/2019) for pap evaluation on medication in person.    Kerin Perna, NP

## 2019-09-09 LAB — CMP14+EGFR
ALT: 8 IU/L (ref 0–32)
AST: 10 IU/L (ref 0–40)
Albumin/Globulin Ratio: 1.9 (ref 1.2–2.2)
Albumin: 4.4 g/dL (ref 3.9–5.0)
Alkaline Phosphatase: 64 IU/L (ref 39–117)
BUN/Creatinine Ratio: 9 (ref 9–23)
BUN: 7 mg/dL (ref 6–20)
Bilirubin Total: 0.4 mg/dL (ref 0.0–1.2)
CO2: 23 mmol/L (ref 20–29)
Calcium: 9.4 mg/dL (ref 8.7–10.2)
Chloride: 99 mmol/L (ref 96–106)
Creatinine, Ser: 0.82 mg/dL (ref 0.57–1.00)
GFR calc Af Amer: 117 mL/min/{1.73_m2} (ref >59)
GFR calc non Af Amer: 101 mL/min/{1.73_m2} (ref >59)
Globulin, Total: 2.3 g/dL (ref 1.5–4.5)
Glucose: 313 mg/dL — ABNORMAL HIGH (ref 65–99)
Potassium: 4.1 mmol/L (ref 3.5–5.2)
Sodium: 137 mmol/L (ref 134–144)
Total Protein: 6.7 g/dL (ref 6.0–8.5)

## 2019-09-09 LAB — CBC WITH DIFFERENTIAL/PLATELET
Basophils Absolute: 0 10*3/uL (ref 0.0–0.2)
Basos: 0 %
EOS (ABSOLUTE): 0 10*3/uL (ref 0.0–0.4)
Eos: 0 %
Hematocrit: 40.6 % (ref 34.0–46.6)
Hemoglobin: 13.3 g/dL (ref 11.1–15.9)
Immature Grans (Abs): 0 10*3/uL (ref 0.0–0.1)
Immature Granulocytes: 0 %
Lymphocytes Absolute: 2 10*3/uL (ref 0.7–3.1)
Lymphs: 23 %
MCH: 25.5 pg — ABNORMAL LOW (ref 26.6–33.0)
MCHC: 32.8 g/dL (ref 31.5–35.7)
MCV: 78 fL — ABNORMAL LOW (ref 79–97)
Monocytes Absolute: 0.7 10*3/uL (ref 0.1–0.9)
Monocytes: 9 %
Neutrophils Absolute: 5.8 10*3/uL (ref 1.4–7.0)
Neutrophils: 68 %
Platelets: 239 10*3/uL (ref 150–450)
RBC: 5.21 x10E6/uL (ref 3.77–5.28)
RDW: 13 % (ref 11.7–15.4)
WBC: 8.6 10*3/uL (ref 3.4–10.8)

## 2019-09-09 LAB — LIPID PANEL
Chol/HDL Ratio: 4.9 ratio — ABNORMAL HIGH (ref 0.0–4.4)
Cholesterol, Total: 219 mg/dL — ABNORMAL HIGH (ref 100–199)
HDL: 45 mg/dL (ref >39)
LDL Chol Calc (NIH): 149 mg/dL — ABNORMAL HIGH (ref 0–99)
Triglycerides: 137 mg/dL (ref 0–149)
VLDL Cholesterol Cal: 25 mg/dL (ref 5–40)

## 2019-09-12 ENCOUNTER — Encounter: Payer: Self-pay | Admitting: Primary Care

## 2019-09-12 LAB — HM DIABETES EYE EXAM

## 2019-09-19 ENCOUNTER — Other Ambulatory Visit (INDEPENDENT_AMBULATORY_CARE_PROVIDER_SITE_OTHER): Payer: Self-pay | Admitting: Primary Care

## 2019-09-19 DIAGNOSIS — E785 Hyperlipidemia, unspecified: Secondary | ICD-10-CM

## 2019-09-19 MED ORDER — ATORVASTATIN CALCIUM 20 MG PO TABS: 20.0000 mg | ORAL_TABLET | Freq: Every day | ORAL | 1 refills | Status: DC

## 2019-10-16 ENCOUNTER — Ambulatory Visit (INDEPENDENT_AMBULATORY_CARE_PROVIDER_SITE_OTHER): Payer: Self-pay | Admitting: Primary Care

## 2019-10-16 ENCOUNTER — Other Ambulatory Visit (HOSPITAL_COMMUNITY)
Admission: RE | Admit: 2019-10-16 | Discharge: 2019-10-16 | Disposition: A | Discharge status: Home / Self Care | Payer: Medicaid Other | Source: Ambulatory Visit | Attending: Primary Care | Admitting: Primary Care

## 2019-10-16 ENCOUNTER — Other Ambulatory Visit: Payer: Self-pay

## 2019-10-16 ENCOUNTER — Encounter (INDEPENDENT_AMBULATORY_CARE_PROVIDER_SITE_OTHER): Payer: Self-pay | Admitting: Primary Care

## 2019-10-16 VITALS — BP 136/97 | HR 103 | Temp 97.2°F | Ht 68.0 in | Wt 163.8 lb

## 2019-10-16 DIAGNOSIS — Z124 Encounter for screening for malignant neoplasm of cervix: Secondary | ICD-10-CM | POA: Insufficient documentation

## 2019-10-16 DIAGNOSIS — Z113 Encounter for screening for infections with a predominantly sexual mode of transmission: Secondary | ICD-10-CM

## 2019-10-16 DIAGNOSIS — Z0000 Encounter for general adult medical examination without abnormal findings: Secondary | ICD-10-CM | POA: Diagnosis not present

## 2019-10-16 DIAGNOSIS — F418 Other specified anxiety disorders: Secondary | ICD-10-CM

## 2019-10-16 MED ORDER — SENNA 8.6 MG PO TABS: 1.0000 | ORAL_TABLET | Freq: Every day | ORAL | 0 refills | Status: DC | PRN

## 2019-10-16 NOTE — Progress Notes (Signed)
Established Patient Office Visit  Subjective:  Patient ID: Tamara Reyes, female    DOB: 1996-08-04  Age: 24 y.o. MRN: [id number]  CC:  Chief Complaint  Patient presents with  . Gynecologic Exam    HPI Tamara Reyes presents for well woman exam gynecological exam.  Past Medical History:  Diagnosis Date  . Asthma   . Diabetes mellitus   . Fibromyalgia   . Genital herpes   . Hypercholesteremia   . Hypertension   . PCOS (polycystic ovarian syndrome)     Past Surgical History:  Procedure Laterality Date  . abscess removal     . TONSILLECTOMY    . TONSILLECTOMY AND ADENOIDECTOMY     tonsils only    Family History  Problem Relation Age of Onset  . Hypertension Mother   . Diabetes Mother        gestational  . Hypertension Maternal Grandmother   . Hypertension Maternal Grandfather   . Hypertension Paternal Grandmother   . Diabetes Paternal Grandmother     Social History   Socioeconomic History  . Marital status: Single    Spouse name: Not on file  . Number of children: Not on file  . Years of education: Not on file  . Highest education level: Not on file  Occupational History  . Not on file  Tobacco Use  . Smoking status: Former Smoker    Types: Cigarettes    Quit date: 05/28/2014    Years since quitting: 5.3  . Smokeless tobacco: Never Used  Substance and Sexual Activity  . Alcohol use: No  . Drug use: Yes    Types: Marijuana  . Sexual activity: Yes    Birth control/protection: None    Comment: had nuva ring, but it is out now  Other Topics Concern  . Not on file  Social History Narrative   Is in 12th grade at [employer] with mom   Social Determinants of Health   Financial Resource Strain:   . Difficulty of Paying Living Expenses: Not on file  Food Insecurity:   . Worried About Charity fundraiser in the Last Year: Not on file  . Ran Out of Food in the Last Year: Not on file  Transportation Needs:   . Lack of Transportation (Medical):  Not on file  . Lack of Transportation (Non-Medical): Not on file  Physical Activity:   . Days of Exercise per Week: Not on file  . Minutes of Exercise per Session: Not on file  Stress:   . Feeling of Stress : Not on file  Social Connections:   . Frequency of Communication with Friends and Family: Not on file  . Frequency of Social Gatherings with Friends and Family: Not on file  . Attends Religious Services: Not on file  . Active Member of Clubs or Organizations: Not on file  . Attends Archivist Meetings: Not on file  . Marital Status: Not on file  Intimate Partner Violence:   . Fear of Current or Ex-Partner: Not on file  . Emotionally Abused: Not on file  . Physically Abused: Not on file  . Sexually Abused: Not on file    Outpatient Medications Prior to Visit  Medication Sig Dispense Refill  . atorvastatin (LIPITOR) 20 MG tablet Take 1 tablet (20 mg total) by mouth daily. 90 tablet 1  . escitalopram (LEXAPRO) 10 MG tablet Take 1 tablet (10 mg total) by mouth daily. 90 tablet 1  . hydrOXYzine (  ATARAX/VISTARIL) 25 MG tablet Take 1 tablet (25 mg total) by mouth at bedtime. 30 tablet 1  . Insulin Glargine (LANTUS SOLOSTAR) 100 UNIT/ML Solostar Pen Inject 45 Units into the skin daily. 5 pen 3  . Insulin Pen Needle (PEN NEEDLES) 31G X 8 MM MISC Inject 1 application into the skin at bedtime. 100 each 3  . lisinopril (ZESTRIL) 5 MG tablet Take 1 tablet (5 mg total) by mouth daily. 90 tablet 1  . polyethylene glycol (MIRALAX / GLYCOLAX) packet Take 17 g by mouth as needed for mild constipation.     . sitaGLIPtin-metformin (JANUMET) 50-1000 MG tablet Take 1 tablet by mouth 2 (two) times daily with a meal. 180 tablet 1   No facility-administered medications prior to visit.    No Known Allergies  ROS Review of Systems  Gastrointestinal: Positive for constipation.  All other systems reviewed and are negative.     Objective:    Physical Exam CONSTITUTIONAL:  Well-developed, well-nourished female in no acute distress.  HENT:  Normocephalic, atraumatic, External right and left ear normal. Oropharynx is clear and moist EYES: Conjunctivae and EOM are normal. Pupils are equal, round, and reactive to light. No scleral icterus.  NECK: Normal range of motion, supple, no masses.  Normal thyroid.  SKIN: Skin is warm and dry. No rash noted. Not diaphoretic. No erythema. No pallor. [place]: Alert and oriented to person, place, and time. Normal reflexes, muscle tone coordination. No cranial nerve deficit noted. PSYCHIATRIC: Normal mood and affect. Normal behavior. Normal judgment and thought content. CARDIOVASCULAR: Normal heart rate noted, regular rhythm RESPIRATORY: Clear to auscultation bilaterally. Effort and breath sounds normal, no problems with respiration noted. BREASTS: Taught SBE ABDOMEN: Soft, normal bowel sounds, no distention noted.  No tenderness, rebound or guarding.  PELVIC: Normal appearing external genitalia; normal appearing vaginal mucosa and cervix.  Abnormal discharge and odor  noted.  Pap smear obtained.  Normal uterine size, no other palpable masses, no uterine or adnexal tenderness. MUSCULOSKELETAL: Normal range of motion. No tenderness.  No cyanosis, clubbing, or edema. BP (!) 136/97 (BP Location: Right Arm, Patient Position: Sitting, Cuff Size: Normal)   Pulse (!) 103   Temp (!) 97.2 F (36.2 C) (Temporal)   Ht 5\' 8"  (1.727 m)   Wt 163 lb 12.8 oz (74.3 kg)   LMP 10/07/2019   SpO2 100%   BMI 24.91 kg/m  Wt Readings from Last 3 Encounters:  10/16/19 163 lb 12.8 oz (74.3 kg)  09/08/19 168 lb 9.6 oz (76.5 kg)  10/16/18 181 lb 6.4 oz (82.3 kg)     Health Maintenance Due  Topic Date Due  . PAP-Cervical Cytology Screening  03/25/2017  . PAP SMEAR-Modifier  03/25/2017  . CHLAMYDIA SCREENING  05/28/2017    There are no preventive care reminders to display for this patient.  Lab Results  Component Value Date   TSH 0.422 (L)  07/31/2018   Lab Results  Component Value Date   WBC 8.6 09/08/2019   HGB 13.3 09/08/2019   HCT 40.6 09/08/2019   MCV 78 (L) 09/08/2019   PLT 239 09/08/2019   Lab Results  Component Value Date   NA 137 09/08/2019   K 4.1 09/08/2019   CO2 23 09/08/2019   GLUCOSE 313 (H) 09/08/2019   BUN 7 09/08/2019   CREATININE 0.82 09/08/2019   BILITOT 0.4 09/08/2019   ALKPHOS 64 09/08/2019   AST 10 09/08/2019   ALT 8 09/08/2019   PROT 6.7 09/08/2019   ALBUMIN 4.4  09/08/2019   CALCIUM 9.4 09/08/2019   ANIONGAP 9 07/15/2018   Lab Results  Component Value Date   CHOL 219 (H) 09/08/2019   Lab Results  Component Value Date   HDL 45 09/08/2019   Lab Results  Component Value Date   LDLCALC 149 (H) 09/08/2019   Lab Results  Component Value Date   TRIG 137 09/08/2019   Lab Results  Component Value Date   CHOLHDL 4.9 (H) 09/08/2019   Lab Results  Component Value Date   HGBA1C 13.1 (A) 09/08/2019      Assessment & Plan:  Dhara was seen today for gynecologic exam.  Diagnoses and all orders for this visit:  Screening examination for sexually transmitted disease -     Cervicovaginal ancillary only -     Cancel: HIV antibody (with reflex) -     HIV antibody (with reflex); Future  Cervical cancer screening -     Cytology - PAP  Depression with anxiety Blood pressure elevated to due increase anxiety . She is on Lexapro helping minimally. Will consider following up with CSW for other coping skills.  Other orders -     senna (SENOKOT) 8.6 MG TABS tablet; Take 1 tablet (8.6 mg total) by mouth daily as needed for mild constipation.   Follow-up: Return in about 3 months (around 01/16/2020) for Bp check in person.    Kerin Perna, NP

## 2019-10-16 NOTE — Patient Instructions (Signed)
MANAGEMENT OF CHRONIC CONSTIPATION  Drink fluids in the recommended amount everyday. Recommend amount is 8 cups of water daily. Do not replace water with Gatorade or Powerade as these should only be used when you are dehydrated.  Eat lots of high fiber foods-fruits, veggies, bran and whole grain instead of white bread Be active everyday. Inactivity makes constipation worse. Add psyllium daily (Metamucil) which comes in capsules now. Start very low dose and work up to recommended dose on bottle daily. Stay away from Milk of Magnesia or any magnesium containing laxative, unless you need it to clear things out rarely. It is an addictive laxative and your gut will become dependent on it. If that is not working, I would start Miralax, which you can buy in generic 17 gms daily. It's a powder and not an "addictive laxative". Take it every day and titrate the dose up or down to get the daily Bm. We will consider the use of other pharmacological treatments should the above recommendations prove to be unsuccessful.   

## 2019-10-17 LAB — CERVICOVAGINAL ANCILLARY ONLY
Bacterial Vaginitis (gardnerella): POSITIVE — AB
Candida Glabrata: NEGATIVE
Candida Vaginitis: NEGATIVE
Chlamydia: NEGATIVE
Comment: NEGATIVE
Comment: NEGATIVE
Comment: NEGATIVE
Comment: NEGATIVE
Comment: NEGATIVE
Comment: NORMAL
Neisseria Gonorrhea: NEGATIVE
Trichomonas: POSITIVE — AB

## 2019-10-17 LAB — HIV ANTIBODY (ROUTINE TESTING W REFLEX): HIV Screen 4th Generation wRfx: NONREACTIVE

## 2019-10-17 LAB — CYTOLOGY - PAP: Diagnosis: NEGATIVE

## 2019-10-22 ENCOUNTER — Encounter (INDEPENDENT_AMBULATORY_CARE_PROVIDER_SITE_OTHER): Payer: Self-pay | Admitting: Primary Care

## 2019-10-22 ENCOUNTER — Other Ambulatory Visit: Payer: Self-pay

## 2019-10-22 ENCOUNTER — Ambulatory Visit (INDEPENDENT_AMBULATORY_CARE_PROVIDER_SITE_OTHER): Payer: Self-pay | Admitting: Primary Care

## 2019-10-22 VITALS — BP 139/95 | HR 50 | Temp 97.5°F | Ht 68.0 in | Wt 169.2 lb

## 2019-10-22 DIAGNOSIS — E119 Type 2 diabetes mellitus without complications: Secondary | ICD-10-CM

## 2019-10-22 DIAGNOSIS — K5900 Constipation, unspecified: Secondary | ICD-10-CM

## 2019-10-22 DIAGNOSIS — A599 Trichomoniasis, unspecified: Secondary | ICD-10-CM

## 2019-10-22 LAB — GLUCOSE, POCT (MANUAL RESULT ENTRY): POC Glucose: 218 mg/dl — AB (ref 70–99)

## 2019-10-22 MED ORDER — METRONIDAZOLE 500 MG PO TABS
2000.0000 mg | ORAL_TABLET | Freq: Once | ORAL | Status: AC
  Administered 2019-10-22: 2000 mg via ORAL

## 2019-10-22 MED ORDER — SENNA 8.6 MG PO TABS: 1.0000 | ORAL_TABLET | Freq: Every day | ORAL | 0 refills | Status: DC | PRN

## 2019-10-22 NOTE — Patient Instructions (Signed)
MANAGEMENT OF CHRONIC CONSTIPATION  Drink fluids in the recommended amount everyday. Recommend amount is 8 cups of water daily. Do not replace water with Gatorade or Powerade as these should only be used when you are dehydrated.  Eat lots of high fiber foods-fruits, veggies, bran and whole grain instead of white bread Be active everyday. Inactivity makes constipation worse. Add psyllium daily (Metamucil) which comes in capsules now. Start very low dose and work up to recommended dose on bottle daily. Stay away from Milk of Magnesia or any magnesium containing laxative, unless you need it to clear things out rarely. It is an addictive laxative and your gut will become dependent on it. If that is not working, I would start Miralax, which you can buy in generic 17 gms daily. It's a powder and not an "addictive laxative". Take it every day and titrate the dose up or down to get the daily Bm. We will consider the use of other pharmacological treatments should the above recommendations prove to be unsuccessful.   

## 2019-10-22 NOTE — Progress Notes (Signed)
Acute Office Visit  Subjective:    Patient ID: Tamara Reyes, female    DOB: 1995/08/28, 24 y.o.   MRN: [id number]  Chief Complaint  Patient presents with  . STD TREATMENT    HPI Patient is in today for treatment for exposure to sexually transmitted disease.  Past Medical History:  Diagnosis Date  . Asthma   . Diabetes mellitus   . Fibromyalgia   . Genital herpes   . Hypercholesteremia   . Hypertension   . PCOS (polycystic ovarian syndrome)     Past Surgical History:  Procedure Laterality Date  . abscess removal     . TONSILLECTOMY    . TONSILLECTOMY AND ADENOIDECTOMY     tonsils only    Family History  Problem Relation Age of Onset  . Hypertension Mother   . Diabetes Mother        gestational  . Hypertension Maternal Grandmother   . Hypertension Maternal Grandfather   . Hypertension Paternal Grandmother   . Diabetes Paternal Grandmother     Social History   Socioeconomic History  . Marital status: Single    Spouse name: Not on file  . Number of children: Not on file  . Years of education: Not on file  . Highest education level: Not on file  Occupational History  . Not on file  Tobacco Use  . Smoking status: Former Smoker    Types: Cigarettes    Quit date: 05/28/2014    Years since quitting: 5.4  . Smokeless tobacco: Never Used  Substance and Sexual Activity  . Alcohol use: No  . Drug use: Yes    Types: Marijuana  . Sexual activity: Yes    Birth control/protection: None    Comment: had nuva ring, but it is out now  Other Topics Concern  . Not on file  Social History Narrative   Is in 12th grade at [employer] with mom   Social Determinants of Health   Financial Resource Strain:   . Difficulty of Paying Living Expenses: Not on file  Food Insecurity:   . Worried About Charity fundraiser in the Last Year: Not on file  . Ran Out of Food in the Last Year: Not on file  Transportation Needs:   . Lack of Transportation (Medical): Not  on file  . Lack of Transportation (Non-Medical): Not on file  Physical Activity:   . Days of Exercise per Week: Not on file  . Minutes of Exercise per Session: Not on file  Stress:   . Feeling of Stress : Not on file  Social Connections:   . Frequency of Communication with Friends and Family: Not on file  . Frequency of Social Gatherings with Friends and Family: Not on file  . Attends Religious Services: Not on file  . Active Member of Clubs or Organizations: Not on file  . Attends Archivist Meetings: Not on file  . Marital Status: Not on file  Intimate Partner Violence:   . Fear of Current or Ex-Partner: Not on file  . Emotionally Abused: Not on file  . Physically Abused: Not on file  . Sexually Abused: Not on file    Outpatient Medications Prior to Visit  Medication Sig Dispense Refill  . atorvastatin (LIPITOR) 20 MG tablet Take 1 tablet (20 mg total) by mouth daily. 90 tablet 1  . escitalopram (LEXAPRO) 10 MG tablet Take 1 tablet (10 mg total) by mouth daily. 90 tablet 1  .  hydrOXYzine (ATARAX/VISTARIL) 25 MG tablet Take 1 tablet (25 mg total) by mouth at bedtime. 30 tablet 1  . Insulin Glargine (LANTUS SOLOSTAR) 100 UNIT/ML Solostar Pen Inject 45 Units into the skin daily. 5 pen 3  . Insulin Pen Needle (PEN NEEDLES) 31G X 8 MM MISC Inject 1 application into the skin at bedtime. 100 each 3  . lisinopril (ZESTRIL) 5 MG tablet Take 1 tablet (5 mg total) by mouth daily. (Patient not taking: Reported on 10/22/2019) 90 tablet 1  . polyethylene glycol (MIRALAX / GLYCOLAX) packet Take 17 g by mouth as needed for mild constipation.     . senna (SENOKOT) 8.6 MG TABS tablet Take 1 tablet (8.6 mg total) by mouth daily as needed for mild constipation. 120 tablet 0  . sitaGLIPtin-metformin (JANUMET) 50-1000 MG tablet Take 1 tablet by mouth 2 (two) times daily with a meal. 180 tablet 1   No facility-administered medications prior to visit.    No Known Allergies  Review of Systems   All other systems reviewed and are negative.      Objective:    Physical Exam Vitals reviewed.  Constitutional:      Appearance: Normal appearance.  Cardiovascular:     Rate and Rhythm: Normal rate and regular rhythm.  Pulmonary:     Effort: Pulmonary effort is normal.     Breath sounds: Normal breath sounds.  Abdominal:     General: Bowel sounds are normal.  Musculoskeletal:        General: Normal range of motion.     Cervical back: Normal range of motion.  Skin:    General: Skin is warm and dry.  Neurological:     Mental Status: She is alert and oriented to person, place, and time.  Psychiatric:        Mood and Affect: Mood normal.        Behavior: Behavior normal.        Thought Content: Thought content normal.        Judgment: Judgment normal.     BP (!) 139/95 (BP Location: Right Arm, Patient Position: Sitting, Cuff Size: Normal)   Pulse (!) 50   Temp (!) 97.5 F (36.4 C) (Temporal)   Ht 5\' 8"  (1.727 m)   Wt 169 lb 3.2 oz (76.7 kg)   LMP 10/07/2019   SpO2 98%   BMI 25.73 kg/m  Wt Readings from Last 3 Encounters:  10/22/19 169 lb 3.2 oz (76.7 kg)  10/16/19 163 lb 12.8 oz (74.3 kg)  09/08/19 168 lb 9.6 oz (76.5 kg)    There are no preventive care reminders to display for this patient.  There are no preventive care reminders to display for this patient.   Lab Results  Component Value Date   TSH 0.422 (L) 07/31/2018   Lab Results  Component Value Date   WBC 8.6 09/08/2019   HGB 13.3 09/08/2019   HCT 40.6 09/08/2019   MCV 78 (L) 09/08/2019   PLT 239 09/08/2019   Lab Results  Component Value Date   NA 137 09/08/2019   K 4.1 09/08/2019   CO2 23 09/08/2019   GLUCOSE 313 (H) 09/08/2019   BUN 7 09/08/2019   CREATININE 0.82 09/08/2019   BILITOT 0.4 09/08/2019   ALKPHOS 64 09/08/2019   AST 10 09/08/2019   ALT 8 09/08/2019   PROT 6.7 09/08/2019   ALBUMIN 4.4 09/08/2019   CALCIUM 9.4 09/08/2019   ANIONGAP 9 07/15/2018   Lab Results   Component Value  Date   CHOL 219 (H) 09/08/2019   Lab Results  Component Value Date   HDL 45 09/08/2019   Lab Results  Component Value Date   LDLCALC 149 (H) 09/08/2019   Lab Results  Component Value Date   TRIG 137 09/08/2019   Lab Results  Component Value Date   CHOLHDL 4.9 (H) 09/08/2019   Lab Results  Component Value Date   HGBA1C 13.1 (A) 09/08/2019       Assessment & Plan:  Verna was seen today for std treatment.  Diagnoses and all orders for this visit:  Type 2 diabetes mellitus without complication, without long-term current use of insulin (HCC)  Goal of therapy: Less than 7.0 hemoglobin A1c.  Continue to monitor foods that are high in carbohydrates are the following rice, potatoes, breads, sugars, and pastas.  Reduction in the intake (eating) will assist in lowering your blood sugars.. Take medication as prescribed and directions -     Glucose (CBG)  Trichomonas infection -     metroNIDAZOLE (FLAGYL) tablet 2,000 mg  Constipation, unspecified constipation type Information sent AVS treatment options for constipation. Sent in refill for senokot   Other orders -     senna (SENOKOT) 8.6 MG TABS tablet; Take 1 tablet (8.6 mg total) by mouth daily as needed for mild constipation.     Kerin Perna, NP

## 2019-12-08 ENCOUNTER — Ambulatory Visit (INDEPENDENT_AMBULATORY_CARE_PROVIDER_SITE_OTHER): Payer: Medicaid Other | Admitting: Primary Care

## 2020-02-20 ENCOUNTER — Ambulatory Visit (INDEPENDENT_AMBULATORY_CARE_PROVIDER_SITE_OTHER): Payer: Medicaid Other | Admitting: Primary Care

## 2020-02-23 ENCOUNTER — Ambulatory Visit (INDEPENDENT_AMBULATORY_CARE_PROVIDER_SITE_OTHER): Payer: Self-pay | Admitting: Primary Care

## 2020-02-23 ENCOUNTER — Encounter (INDEPENDENT_AMBULATORY_CARE_PROVIDER_SITE_OTHER): Payer: Self-pay | Admitting: Primary Care

## 2020-02-23 ENCOUNTER — Other Ambulatory Visit: Payer: Self-pay

## 2020-02-23 VITALS — BP 122/86 | HR 74 | Temp 97.8°F | Ht 68.0 in | Wt 158.6 lb

## 2020-02-23 DIAGNOSIS — E119 Type 2 diabetes mellitus without complications: Secondary | ICD-10-CM

## 2020-02-23 LAB — GLUCOSE, POCT (MANUAL RESULT ENTRY): POC Glucose: 340 mg/dl — AB (ref 70–99)

## 2020-02-23 LAB — POCT GLYCOSYLATED HEMOGLOBIN (HGB A1C): Hemoglobin A1C: 12.9 % — AB (ref 4.0–5.6)
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
## 2020-03-05 ENCOUNTER — Other Ambulatory Visit (HOSPITAL_COMMUNITY)
Admission: RE | Admit: 2020-03-05 | Discharge: 2020-03-05 | Disposition: A | Discharge status: Home / Self Care | Payer: Medicaid Other | Source: Ambulatory Visit | Attending: Primary Care | Admitting: Primary Care

## 2020-03-05 ENCOUNTER — Other Ambulatory Visit: Payer: Self-pay

## 2020-03-05 ENCOUNTER — Encounter (INDEPENDENT_AMBULATORY_CARE_PROVIDER_SITE_OTHER): Payer: Self-pay | Admitting: Primary Care

## 2020-03-05 ENCOUNTER — Ambulatory Visit (INDEPENDENT_AMBULATORY_CARE_PROVIDER_SITE_OTHER): Payer: Self-pay | Admitting: Primary Care

## 2020-03-05 VITALS — BP 119/84 | HR 91 | Temp 98.2°F | Ht 68.0 in | Wt 161.0 lb

## 2020-03-05 DIAGNOSIS — I10 Essential (primary) hypertension: Secondary | ICD-10-CM

## 2020-03-05 DIAGNOSIS — R7989 Other specified abnormal findings of blood chemistry: Secondary | ICD-10-CM

## 2020-03-05 DIAGNOSIS — K219 Gastro-esophageal reflux disease without esophagitis: Secondary | ICD-10-CM

## 2020-03-05 DIAGNOSIS — N898 Other specified noninflammatory disorders of vagina: Secondary | ICD-10-CM

## 2020-03-05 DIAGNOSIS — E119 Type 2 diabetes mellitus without complications: Secondary | ICD-10-CM

## 2020-03-05 DIAGNOSIS — F418 Other specified anxiety disorders: Secondary | ICD-10-CM

## 2020-03-05 LAB — GLUCOSE, POCT (MANUAL RESULT ENTRY): POC Glucose: 290 mg/dl — AB (ref 70–99)

## 2020-03-05 MED ORDER — LISINOPRIL 2.5 MG PO TABS: 5.0000 mg | ORAL_TABLET | Freq: Every day | ORAL | 1 refills | Status: DC

## 2020-03-05 MED ORDER — JANUMET 50-1000 MG PO TABS: 1.0000 | ORAL_TABLET | Freq: Two times a day (BID) | ORAL | 1 refills | Status: DC

## 2020-03-05 MED ORDER — TRULICITY 0.75 MG/0.5ML ~~LOC~~ SOAJ: 0.7500 mg | SUBCUTANEOUS | 5 refills | Status: DC

## 2020-03-05 MED ORDER — OMEPRAZOLE 20 MG PO CPDR: 20.0000 mg | DELAYED_RELEASE_CAPSULE | Freq: Every day | ORAL | 3 refills | Status: DC

## 2020-03-05 MED ORDER — HYDROXYZINE HCL 25 MG PO TABS: 25.0000 mg | ORAL_TABLET | Freq: Every day | ORAL | 1 refills | Status: DC

## 2020-03-05 MED FILL — JANUMET 50-1,000 MG TABLET: 50-1000 | 30 days supply | Qty: 60 | Fill #0

## 2020-03-05 MED FILL — OMEPRAZOLE 20 MG CAP: 20 | 30 days supply | Qty: 30 | Fill #0

## 2020-03-05 MED FILL — LISINOPRIL 2.5 MG TABLET: 2.5 | 30 days supply | Qty: 60 | Fill #0

## 2020-03-05 MED FILL — hydrOXYzine HCL 25 MG TABS: 25 | 30 days supply | Qty: 30 | Fill #0

## 2020-03-05 MED FILL — TRULICITY 0.75 MG/0.5 ML PE: 0.75 | 28 days supply | Qty: 2 | Fill #0

## 2020-03-05 NOTE — Patient Instructions (Signed)
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
## 2020-03-06 LAB — CMP14+EGFR
ALT: 12 IU/L (ref 0–32)
AST: 13 IU/L (ref 0–40)
Albumin/Globulin Ratio: 1.6 (ref 1.2–2.2)
Albumin: 4.4 g/dL (ref 3.9–5.0)
Alkaline Phosphatase: 63 IU/L (ref 48–121)
BUN/Creatinine Ratio: 14 (ref 9–23)
BUN: 10 mg/dL (ref 6–20)
Bilirubin Total: 0.4 mg/dL (ref 0.0–1.2)
CO2: 23 mmol/L (ref 20–29)
Calcium: 9.8 mg/dL (ref 8.7–10.2)
Chloride: 97 mmol/L (ref 96–106)
Creatinine, Ser: 0.71 mg/dL (ref 0.57–1.00)
GFR calc Af Amer: 139 mL/min/{1.73_m2} (ref >59)
GFR calc non Af Amer: 120 mL/min/{1.73_m2} (ref >59)
Globulin, Total: 2.7 g/dL (ref 1.5–4.5)
Glucose: 287 mg/dL — ABNORMAL HIGH (ref 65–99)
Potassium: 4.2 mmol/L (ref 3.5–5.2)
Sodium: 134 mmol/L (ref 134–144)
Total Protein: 7.1 g/dL (ref 6.0–8.5)

## 2020-03-06 LAB — CBC WITH DIFFERENTIAL/PLATELET
Basophils Absolute: 0 10*3/uL (ref 0.0–0.2)
Basos: 0 %
EOS (ABSOLUTE): 0 10*3/uL (ref 0.0–0.4)
Eos: 0 %
Hematocrit: 43 % (ref 34.0–46.6)
Hemoglobin: 13.8 g/dL (ref 11.1–15.9)
Immature Grans (Abs): 0 10*3/uL (ref 0.0–0.1)
Immature Granulocytes: 0 %
Lymphocytes Absolute: 2.9 10*3/uL (ref 0.7–3.1)
Lymphs: 30 %
MCH: 25.6 pg — ABNORMAL LOW (ref 26.6–33.0)
MCHC: 32.1 g/dL (ref 31.5–35.7)
MCV: 80 fL (ref 79–97)
Monocytes Absolute: 0.7 10*3/uL (ref 0.1–0.9)
Monocytes: 7 %
Neutrophils Absolute: 6 10*3/uL (ref 1.4–7.0)
Neutrophils: 63 %
Platelets: 230 10*3/uL (ref 150–450)
RBC: 5.4 x10E6/uL — ABNORMAL HIGH (ref 3.77–5.28)
RDW: 13 % (ref 11.7–15.4)
WBC: 9.6 10*3/uL (ref 3.4–10.8)

## 2020-03-06 LAB — TSH+FREE T4
Free T4: 1.31 ng/dL (ref 0.82–1.77)
TSH: 0.464 u[IU]/mL (ref 0.450–4.500)

## 2020-03-06 LAB — LIPID PANEL
Chol/HDL Ratio: 4.8 ratio — ABNORMAL HIGH (ref 0.0–4.4)
Cholesterol, Total: 258 mg/dL — ABNORMAL HIGH (ref 100–199)
HDL: 54 mg/dL (ref >39)
LDL Chol Calc (NIH): 186 mg/dL — ABNORMAL HIGH (ref 0–99)
Triglycerides: 104 mg/dL (ref 0–149)
VLDL Cholesterol Cal: 18 mg/dL (ref 5–40)

## 2020-03-08 LAB — CERVICOVAGINAL ANCILLARY ONLY
Bacterial Vaginitis (gardnerella): POSITIVE — AB
Candida Glabrata: NEGATIVE
Candida Vaginitis: POSITIVE — AB
Chlamydia: NEGATIVE
Comment: NEGATIVE
Comment: NEGATIVE
Comment: NEGATIVE
Comment: NEGATIVE
Comment: NEGATIVE
Comment: NORMAL
Neisseria Gonorrhea: NEGATIVE
Trichomonas: NEGATIVE

## 2020-03-09 ENCOUNTER — Other Ambulatory Visit (INDEPENDENT_AMBULATORY_CARE_PROVIDER_SITE_OTHER): Payer: Self-pay | Admitting: Primary Care

## 2020-03-09 DIAGNOSIS — B379 Candidiasis, unspecified: Secondary | ICD-10-CM

## 2020-03-09 DIAGNOSIS — B9689 Other specified bacterial agents as the cause of diseases classified elsewhere: Secondary | ICD-10-CM

## 2020-03-09 MED ORDER — CLINDAMYCIN HCL 300 MG PO CAPS: 300.0000 mg | ORAL_CAPSULE | Freq: Two times a day (BID) | ORAL | 0 refills | Status: DC

## 2020-03-09 MED ORDER — FLUCONAZOLE 150 MG PO TABS: 150.0000 mg | ORAL_TABLET | Freq: Once | ORAL | 0 refills | Status: AC

## 2020-03-10 MED FILL — FLUCONAZOLE 150 MG TABLET: 150 | 1 days supply | Qty: 1 | Fill #0

## 2020-03-10 MED FILL — CLINDAMYCIN HCL 300 MG CAPS: 300 | 7 days supply | Qty: 14 | Fill #0

## 2020-03-22 MED FILL — CLINDAMYCIN HCL 300 MG CAPS: 300 | 7 days supply | Qty: 14 | Fill #0

## 2020-03-22 MED FILL — FLUCONAZOLE 150 MG TABLET: 150 | 1 days supply | Qty: 1 | Fill #0

## 2020-03-22 MED FILL — TRULICITY 0.75 MG/0.5 ML PE: 0.75 | 28 days supply | Qty: 2 | Fill #0

## 2020-05-07 ENCOUNTER — Other Ambulatory Visit: Payer: Medicaid Other

## 2020-05-07 DIAGNOSIS — Z20822 Contact with and (suspected) exposure to covid-19: Secondary | ICD-10-CM

## 2020-05-08 LAB — SARS-COV-2, NAA 2 DAY TAT

## 2020-05-08 LAB — NOVEL CORONAVIRUS, NAA: SARS-CoV-2, NAA: NOT DETECTED

## 2020-05-25 MED FILL — TRULICITY 0.75 MG/0.5 ML PE: 0.75 | 28 days supply | Qty: 2 | Fill #1

## 2020-05-25 MED FILL — hydrOXYzine HCL 25 MG TABS: 25 | 30 days supply | Qty: 30 | Fill #0

## 2020-07-20 ENCOUNTER — Encounter (INDEPENDENT_AMBULATORY_CARE_PROVIDER_SITE_OTHER): Payer: Self-pay | Admitting: Primary Care

## 2020-07-20 ENCOUNTER — Ambulatory Visit (INDEPENDENT_AMBULATORY_CARE_PROVIDER_SITE_OTHER): Payer: Self-pay | Admitting: Primary Care

## 2020-07-20 ENCOUNTER — Other Ambulatory Visit: Payer: Self-pay

## 2020-07-20 VITALS — BP 139/89 | HR 74 | Temp 97.3°F | Ht 68.0 in | Wt 166.2 lb

## 2020-07-20 DIAGNOSIS — M2550 Pain in unspecified joint: Secondary | ICD-10-CM

## 2020-07-20 DIAGNOSIS — M545 Low back pain, unspecified: Secondary | ICD-10-CM

## 2020-07-20 DIAGNOSIS — I10 Essential (primary) hypertension: Secondary | ICD-10-CM

## 2020-07-20 DIAGNOSIS — Z760 Encounter for issue of repeat prescription: Secondary | ICD-10-CM

## 2020-07-20 DIAGNOSIS — E785 Hyperlipidemia, unspecified: Secondary | ICD-10-CM

## 2020-07-20 DIAGNOSIS — E119 Type 2 diabetes mellitus without complications: Secondary | ICD-10-CM

## 2020-07-20 DIAGNOSIS — F418 Other specified anxiety disorders: Secondary | ICD-10-CM

## 2020-07-20 LAB — GLUCOSE, POCT (MANUAL RESULT ENTRY): POC Glucose: 178 mg/dl — AB (ref 70–99)

## 2020-07-20 LAB — POCT GLYCOSYLATED HEMOGLOBIN (HGB A1C): Hemoglobin A1C: 9.5 % — AB (ref 4.0–5.6)

## 2020-07-20 MED ORDER — TRULICITY 0.75 MG/0.5ML ~~LOC~~ SOAJ: 0.7500 mg | SUBCUTANEOUS | 3 refills | Status: DC

## 2020-07-20 MED ORDER — ATORVASTATIN CALCIUM 20 MG PO TABS: 20.0000 mg | ORAL_TABLET | Freq: Every day | ORAL | 1 refills | Status: DC

## 2020-07-20 MED ORDER — ESCITALOPRAM OXALATE 10 MG PO TABS: 10.0000 mg | ORAL_TABLET | Freq: Every day | ORAL | 1 refills | Status: DC

## 2020-07-20 MED ORDER — METFORMIN HCL 1000 MG PO TABS: 1000.0000 mg | ORAL_TABLET | Freq: Two times a day (BID) | ORAL | 3 refills | Status: DC

## 2020-07-20 MED FILL — TRULICITY 0.75 MG/0.5 ML PE: 0.75 | 28 days supply | Qty: 2 | Fill #0

## 2020-07-20 MED FILL — ESCITALOPRAM 10 MG TABLET: 10 | 30 days supply | Qty: 30 | Fill #0

## 2020-07-20 MED FILL — METFORMIN HCL 1000 MG TABS: 1000 | 30 days supply | Qty: 60 | Fill #0

## 2020-07-20 MED FILL — ATORVASTATIN CALCIUM 20 MG: 20 | 30 days supply | Qty: 30 | Fill #0

## 2020-07-20 NOTE — Patient Instructions (Signed)

## 2020-07-20 NOTE — Progress Notes (Signed)
.med  Established Patient Office Visit  Subjective:  Patient ID: Tamara Reyes, female    DOB: 08/27/1995  Age: 24 y.o. MRN: [id number]  CC:  Chief Complaint  Patient presents with  . Diabetes  . Pain    back stiffness from fibromyalgia     HPI [name] is a 24 year old female presents for management of type 2 DM. Last seen in July 2021.Denies polyuria, polydipsia ,polyphagsia and visual changes   Past Medical History:  Diagnosis Date  . Asthma   . Diabetes mellitus   . Fibromyalgia   . Genital herpes   . Hypercholesteremia   . Hypertension   . PCOS (polycystic ovarian syndrome)     Past Surgical History:  Procedure Laterality Date  . abscess removal     . TONSILLECTOMY    . TONSILLECTOMY AND ADENOIDECTOMY     tonsils only    Family History  Problem Relation Age of Onset  . Hypertension Mother   . Diabetes Mother        gestational  . Hypertension Maternal Grandmother   . Hypertension Maternal Grandfather   . Hypertension Paternal Grandmother   . Diabetes Paternal Grandmother     Social History   Socioeconomic History  . Marital status: Single    Spouse name: Not on file  . Number of children: Not on file  . Years of education: Not on file  . Highest education level: Not on file  Occupational History  . Not on file  Tobacco Use  . Smoking status: Former Smoker    Types: Cigarettes    Quit date: 05/28/2014    Years since quitting: 6.1  . Smokeless tobacco: Never Used  Vaping Use  . Vaping Use: Some days  Substance and Sexual Activity  . Alcohol use: No  . Drug use: Yes    Types: Marijuana  . Sexual activity: Yes    Birth control/protection: None    Comment: had nuva ring, but it is out now  Other Topics Concern  . Not on file  Social History Narrative   Is in 12th grade at [employer] with mom   Social Determinants of Health   Financial Resource Strain: Not on file  Food Insecurity: Not on file  Transportation Needs: Not on  file  Physical Activity: Not on file  Stress: Not on file  Social Connections: Not on file  Intimate Partner Violence: Not on file    Outpatient Medications Prior to Visit  Medication Sig Dispense Refill  . hydrOXYzine (ATARAX/VISTARIL) 25 MG tablet Take 1 tablet (25 mg total) by mouth at bedtime. 30 tablet 1  . Insulin Pen Needle (PEN NEEDLES) 31G X 8 MM MISC Inject 1 application into the skin at bedtime. 100 each 3  . lisinopril (ZESTRIL) 2.5 MG tablet Take 2 tablets (5 mg total) by mouth daily. 90 tablet 1  . omeprazole (PRILOSEC) 20 MG capsule Take 1 capsule (20 mg total) by mouth daily. 30 capsule 3  . polyethylene glycol (MIRALAX / GLYCOLAX) packet Take 17 g by mouth as needed for mild constipation.     . senna (SENOKOT) 8.6 MG TABS tablet Take 1 tablet (8.6 mg total) by mouth daily as needed for mild constipation. (Patient not taking: Reported on 02/23/2020) 120 tablet 0  . atorvastatin (LIPITOR) 20 MG tablet Take 1 tablet (20 mg total) by mouth daily. (Patient not taking: Reported on 03/05/2020) 90 tablet 1  . clindamycin (CLEOCIN) 300 MG capsule  Take 1 capsule (300 mg total) by mouth 2 (two) times daily. 14 capsule 0  . Dulaglutide (TRULICITY) 4.00 QQ/7.6PP SOPN Inject 0.5 mLs (0.75 mg total) into the skin once a week. 5 pen 5  . escitalopram (LEXAPRO) 10 MG tablet Take 1 tablet (10 mg total) by mouth daily. (Patient not taking: Reported on 03/05/2020) 90 tablet 1  . sitaGLIPtin-metformin (JANUMET) 50-1000 MG tablet Take 1 tablet by mouth 2 (two) times daily with a meal. 180 tablet 1   No facility-administered medications prior to visit.    No Known Allergies  ROS Review of Systems  Endocrine: Positive for polydipsia, polyphagia and polyuria.  Musculoskeletal: Positive for arthralgias and back pain.  Psychiatric/Behavioral: The patient is nervous/anxious.        Depression  All other systems reviewed and are negative.     Objective:    Physical Exam Vitals reviewed.   Constitutional:      Appearance: Normal appearance. She is normal weight.  HENT:     Head: Normocephalic.     Right Ear: Tympanic membrane normal.     Left Ear: Tympanic membrane normal.     Nose: Nose normal.  Eyes:     Extraocular Movements: Extraocular movements intact.     Pupils: Pupils are equal, round, and reactive to light.  Cardiovascular:     Rate and Rhythm: Normal rate and regular rhythm.  Pulmonary:     Effort: Pulmonary effort is normal.     Breath sounds: Normal breath sounds.  Abdominal:     General: Abdomen is flat. Bowel sounds are normal.     Palpations: Abdomen is soft.  Musculoskeletal:        General: Normal range of motion.     Cervical back: Normal range of motion.  Skin:    General: Skin is warm and dry.  Neurological:     Mental Status: She is alert and oriented to person, place, and time.  Psychiatric:        Mood and Affect: Mood normal.        Behavior: Behavior normal.        Thought Content: Thought content normal.        Judgment: Judgment normal.     BP 139/89 (BP Location: Right Arm, Patient Position: Sitting, Cuff Size: Normal)   Pulse 74   Temp (!) 97.3 F (36.3 C) (Temporal)   Ht 5\' 8"  (1.727 m)   Wt 166 lb 3.2 oz (75.4 kg)   SpO2 100%   BMI 25.27 kg/m  Wt Readings from Last 3 Encounters:  07/20/20 166 lb 3.2 oz (75.4 kg)  03/05/20 161 lb (73 kg)  02/23/20 158 lb 9.6 oz (71.9 kg)     Health Maintenance Due  Topic Date Due  . COVID-19 Vaccine (1) Never done  . INFLUENZA VACCINE  Never done    There are no preventive care reminders to display for this patient.  Lab Results  Component Value Date   TSH 0.464 03/05/2020   Lab Results  Component Value Date   WBC 9.6 03/05/2020   HGB 13.8 03/05/2020   HCT 43.0 03/05/2020   MCV 80 03/05/2020   PLT 230 03/05/2020   Lab Results  Component Value Date   NA 134 03/05/2020   K 4.2 03/05/2020   CO2 23 03/05/2020   GLUCOSE 287 (H) 03/05/2020   BUN 10 03/05/2020    CREATININE 0.71 03/05/2020   BILITOT 0.4 03/05/2020   ALKPHOS 63 03/05/2020   AST  13 03/05/2020   ALT 12 03/05/2020   PROT 7.1 03/05/2020   ALBUMIN 4.4 03/05/2020   CALCIUM 9.8 03/05/2020   ANIONGAP 9 07/15/2018   Lab Results  Component Value Date   CHOL 258 (H) 03/05/2020   Lab Results  Component Value Date   HDL 54 03/05/2020   Lab Results  Component Value Date   LDLCALC 186 (H) 03/05/2020   Lab Results  Component Value Date   TRIG 104 03/05/2020   Lab Results  Component Value Date   CHOLHDL 4.8 (H) 03/05/2020   Lab Results  Component Value Date   HGBA1C 9.5 (A) 07/20/2020      Assessment & Plan:  [place] was seen today for diabetes and pain.  Diagnoses and all orders for this visit:  Type 2 diabetes mellitus without complication, without long-term current use of insulin (HCC) Uncontrol non compliant Discussed  co- morbidities with uncontrol diabetes  Complications -diabetic retinopathy, (close your eyes ? What do you see nothing) nephropathy decrease in kidney function- can lead to dialysis-on a machine 3 days a week to filter your kidney, neuropathy- numbness and tinging in your hands and feet,  increase risk of heart attack and stroke, and amputation due to decrease wound healing and circulation. Decrease your risk by taking medication daily as prescribed, monitor carbohydrates- foods that are high in carbohydrates are the following rice, potatoes, breads, sugars, and pastas.  Reduction in the intake (eating) will assist in lowering your blood sugars. Exercise daily at least 30 minutes daily. -     HgB A1c 9.5 down from 12.9 -     Glucose (CBG)  Hyperlipidemia, unspecified hyperlipidemia type  Depression with anxiety  Acute bilateral low back pain without sciatica BACK PAIN  Location: mid to lower  Quality: aching, cramping, sharp, squeezing and tight (pulling) Onset: rapid Worse with: cold environment   Better with: heat  Radiation: dow legs to thighs   Trauma: no Best sitting/standing/leaning forward:yes Red Flags Fecal/urinary incontinence: no  Numbness/Weakness: yes  Fever/chills/sweats: no  Night pain: yes  Unexplained weight loss: no  No relief with bedrest: no  h/o cancer/immunosuppression: no  IV drug use: no  PMH of osteoporosis or chronic steroid use: no   Essential hypertension Counseled on blood pressure goal of less than 130/80, low-sodium, DASH diet, medication compliance, 150 minutes of moderate intensity exercise per week. Discussed medication compliance, adverse effects.  Medication refill -     escitalopram (LEXAPRO) 10 MG tablet; Take 1 tablet (10 mg total) by mouth daily. -     atorvastatin (LIPITOR) 20 MG tablet; Take 1 tablet (20 mg total) by mouth daily. -     Dulaglutide (TRULICITY) 8.52 DP/8.2UM SOPN; Inject 0.75 mg into the skin once a week. -     metFORMIN (GLUCOPHAGE) 1000 MG tablet; Take 1 tablet (1,000 mg total) by mouth 2 (two) times daily with a meal.   Follow-up: Return in about 6 weeks (around 08/31/2020) for Depression/ appt with CSW same day.    Kerin Perna, NP

## 2020-07-20 NOTE — Progress Notes (Signed)
Not taking medications as prescribed  Complains that she has been having really sharp pains in her hands

## 2020-07-21 LAB — SEDIMENTATION RATE: Sed Rate: 29 mm/hr (ref 0–32)

## 2020-08-10 ENCOUNTER — Inpatient Hospital Stay (HOSPITAL_COMMUNITY)
Admission: AD | Admit: 2020-08-10 | Discharge: 2020-08-10 | Disposition: A | Discharge status: Home / Self Care | Payer: Medicaid Other | Attending: Obstetrics & Gynecology | Admitting: Obstetrics & Gynecology

## 2020-08-10 ENCOUNTER — Encounter (HOSPITAL_COMMUNITY): Payer: Self-pay | Admitting: Family Medicine

## 2020-08-10 ENCOUNTER — Inpatient Hospital Stay (HOSPITAL_COMMUNITY): Payer: Medicaid Other

## 2020-08-10 ENCOUNTER — Other Ambulatory Visit: Payer: Self-pay

## 2020-08-10 DIAGNOSIS — Z3A01 Less than 8 weeks gestation of pregnancy: Secondary | ICD-10-CM | POA: Insufficient documentation

## 2020-08-10 DIAGNOSIS — Z87891 Personal history of nicotine dependence: Secondary | ICD-10-CM | POA: Diagnosis not present

## 2020-08-10 DIAGNOSIS — O3680X0 Pregnancy with inconclusive fetal viability, not applicable or unspecified: Secondary | ICD-10-CM

## 2020-08-10 DIAGNOSIS — R109 Unspecified abdominal pain: Secondary | ICD-10-CM | POA: Diagnosis not present

## 2020-08-10 DIAGNOSIS — O26891 Other specified pregnancy related conditions, first trimester: Secondary | ICD-10-CM | POA: Insufficient documentation

## 2020-08-10 LAB — URINALYSIS, ROUTINE W REFLEX MICROSCOPIC
Bacteria, UA: NONE SEEN
Bilirubin Urine: NEGATIVE
Glucose, UA: >=500 mg/dL — AB
Hgb urine dipstick: NEGATIVE
Ketones, ur: NEGATIVE mg/dL
Leukocytes,Ua: NEGATIVE
Nitrite: NEGATIVE
Protein, ur: 100 mg/dL — AB
Specific Gravity, Urine: 1.021 (ref 1.005–1.030)
pH: 7 (ref 5.0–8.0)

## 2020-08-10 LAB — COMPREHENSIVE METABOLIC PANEL
ALT: 14 U/L (ref 0–44)
AST: 14 U/L — ABNORMAL LOW (ref 15–41)
Albumin: 3.8 g/dL (ref 3.5–5.0)
Alkaline Phosphatase: 42 U/L (ref 38–126)
Anion gap: 10 (ref 5–15)
BUN: 8 mg/dL (ref 6–20)
CO2: 26 mmol/L (ref 22–32)
Calcium: 9.4 mg/dL (ref 8.9–10.3)
Chloride: 100 mmol/L (ref 98–111)
Creatinine, Ser: 0.74 mg/dL (ref 0.44–1.00)
GFR, Estimated: >60 mL/min (ref >60)
Glucose, Bld: 240 mg/dL — ABNORMAL HIGH (ref 70–99)
Potassium: 3.6 mmol/L (ref 3.5–5.1)
Sodium: 136 mmol/L (ref 135–145)
Total Bilirubin: 0.5 mg/dL (ref 0.3–1.2)
Total Protein: 7.4 g/dL (ref 6.5–8.1)

## 2020-08-10 LAB — RAPID URINE DRUG SCREEN, HOSP PERFORMED
Amphetamines: NOT DETECTED
Barbiturates: NOT DETECTED
Benzodiazepines: NOT DETECTED
Cocaine: NOT DETECTED
Opiates: NOT DETECTED
Tetrahydrocannabinol: POSITIVE — AB

## 2020-08-10 LAB — TYPE AND SCREEN
ABO/RH(D): A POS
Antibody Screen: NEGATIVE

## 2020-08-10 LAB — CBC
HCT: 40.1 % (ref 36.0–46.0)
Hemoglobin: 13.1 g/dL (ref 12.0–15.0)
MCH: 25.9 pg — ABNORMAL LOW (ref 26.0–34.0)
MCHC: 32.7 g/dL (ref 30.0–36.0)
MCV: 79.2 fL — ABNORMAL LOW (ref 80.0–100.0)
Platelets: 298 10*3/uL (ref 150–400)
RBC: 5.06 MIL/uL (ref 3.87–5.11)
RDW: 12.4 % (ref 11.5–15.5)
WBC: 12.2 10*3/uL — ABNORMAL HIGH (ref 4.0–10.5)
nRBC: 0 % (ref 0.0–0.2)

## 2020-08-10 LAB — POCT PREGNANCY, URINE: Preg Test, Ur: POSITIVE — AB

## 2020-08-10 LAB — HCG, QUANTITATIVE, PREGNANCY: hCG, Beta Chain, Quant, S: 1088 m[IU]/mL — ABNORMAL HIGH (ref <5)

## 2020-08-10 MED ORDER — HYDROMORPHONE HCL 1 MG/ML IJ SOLN
1.0000 mg | Freq: Once | INTRAMUSCULAR | Status: AC
  Administered 2020-08-10: 1 mg via INTRAMUSCULAR
  Filled 2020-08-10: qty 1

## 2020-08-10 NOTE — MAU Note (Signed)
Last 2 period (Oct/Nov) were really bad. Cramping in lower abd, but bleeding hasn't started.   Denies bleeding.Reports hx of fibromyalgia and diabetes.  +HPT x4

## 2020-08-10 NOTE — MAU Provider Note (Signed)
History     CSN: [id number]  Arrival date and time: 08/10/20 1700  Event Date/Time  First Provider Initiated Contact with Patient 08/10/20 1821     Chief Complaint  Patient presents with  . Abdominal Pain  . Possible Pregnancy   HPI Tamara Reyes is a 24 y.o. G1P0000 in early pregnancy who presents to MAU with chief complaints of abdominal pain and pelvic pressure. These are new complaints, onset uncertain as patient reports severe cramping with her most recent menstrual cycles. She endorses multiple home pregnancy tests. On arrival to MAU she identifies abdominal pain score of 8/10. She has not taken medication or tried other treatments for these complaints. She denies aggravating or alleviating factors. She denies vaginal bleeding, abdominal tenderness, fever or recent illness. Most recent intercourse two days ago.  OB History    Gravida  1   Para  0   Term  0   Preterm  0   AB  0   Living  0     SAB  0   IAB  0   Ectopic  0   Multiple  0   Live Births              Past Medical History:  Diagnosis Date  . Asthma   . Diabetes mellitus   . Fibromyalgia   . Genital herpes   . Hypercholesteremia   . Hypertension   . PCOS (polycystic ovarian syndrome)     Past Surgical History:  Procedure Laterality Date  . abscess removal     . TONSILLECTOMY    . TONSILLECTOMY AND ADENOIDECTOMY     tonsils only    Family History  Problem Relation Age of Onset  . Hypertension Mother   . Diabetes Mother        gestational  . Hypertension Maternal Grandmother   . Hypertension Maternal Grandfather   . Hypertension Paternal Grandmother   . Diabetes Paternal Grandmother     Social History   Tobacco Use  . Smoking status: Former Smoker    Types: Cigarettes    Quit date: 05/28/2014    Years since quitting: 6.2  . Smokeless tobacco: Never Used  Vaping Use  . Vaping Use: Some days  Substance Use Topics  . Alcohol use: No  . Drug use: Yes    Types: Marijuana     Allergies: No Known Allergies  Medications Prior to Admission  Medication Sig Dispense Refill Last Dose  . atorvastatin (LIPITOR) 20 MG tablet Take 1 tablet (20 mg total) by mouth daily. 90 tablet 1 Past Month at Unknown time  . escitalopram (LEXAPRO) 10 MG tablet Take 1 tablet (10 mg total) by mouth daily. 90 tablet 1   . hydrOXYzine (ATARAX/VISTARIL) 25 MG tablet Take 1 tablet (25 mg total) by mouth at bedtime. 30 tablet 1 Past Month at Unknown time  . lisinopril (ZESTRIL) 2.5 MG tablet Take 2 tablets (5 mg total) by mouth daily. 90 tablet 1 Past Month at Unknown time  . metFORMIN (GLUCOPHAGE) 1000 MG tablet Take 1 tablet (1,000 mg total) by mouth 2 (two) times daily with a meal. 180 tablet 3 Past Month at Unknown time  . Dulaglutide (TRULICITY) 0.75 MG/0.5ML SOPN Inject 0.75 mg into the skin once a week. 0.5 mL 3   . Insulin Pen Needle (PEN NEEDLES) 31G X 8 MM MISC Inject 1 application into the skin at bedtime. 100 each 3   . omeprazole (PRILOSEC) 20 MG capsule Take 1 capsule (20  mg total) by mouth daily. 30 capsule 3   . polyethylene glycol (MIRALAX / GLYCOLAX) packet Take 17 g by mouth as needed for mild constipation.      . senna (SENOKOT) 8.6 MG TABS tablet Take 1 tablet (8.6 mg total) by mouth daily as needed for mild constipation. (Patient not taking: Reported on 02/23/2020) 120 tablet 0     Review of Systems  Gastrointestinal: Positive for abdominal pain.  Genitourinary: Positive for pelvic pain.  Musculoskeletal: Positive for back pain.  All other systems reviewed and are negative.  Physical Exam   Blood pressure 123/86, pulse 71, temperature 98.1 F (36.7 C), temperature source Oral, resp. rate 16, height 5\' 8"  (1.727 m), weight 75.1 kg, last menstrual period 07/09/2020, SpO2 100 %.  Physical Exam Vitals and nursing note reviewed. Exam conducted with a chaperone present.  Constitutional:      Appearance: She is well-developed. She is ill-appearing.  Cardiovascular:      Rate and Rhythm: Normal rate.     Heart sounds: Normal heart sounds.  Pulmonary:     Effort: Pulmonary effort is normal.     Breath sounds: Normal breath sounds.  Abdominal:     Tenderness: There is no abdominal tenderness. There is no right CVA tenderness or left CVA tenderness.  Skin:    General: Skin is warm.  Neurological:     Mental Status: She is alert and oriented to person, place, and time.  Psychiatric:        Mood and Affect: Mood is anxious.        Behavior: Behavior normal.     MAU Course  Procedures  --Patient rocking with pain in triage. Frequently repositioning in attempt to relieve pain  Orders Placed This Encounter  Procedures  . 07/11/2020 OB LESS THAN 14 WEEKS WITH OB TRANSVAGINAL  . Urinalysis, Routine w reflex microscopic Urine, Clean Catch  . CBC  . Comprehensive metabolic panel  . hCG, quantitative, pregnancy  . Rapid urine drug screen (hospital performed)  . Diet NPO time specified  . Pregnancy, urine POC  . Type and screen [facility]   Patient Vitals for the past 24 hrs:  BP Temp Temp src Pulse Resp SpO2 Height Weight  08/10/20 2009 138/90 -- -- 71 -- -- -- --  08/10/20 1738 123/86 98.1 F (36.7 C) Oral 71 16 100 % 5\' 8"  (1.727 m) 75.1 kg   Results for orders placed or performed during the hospital encounter of 08/10/20 (from the past 24 hour(s))  Urinalysis, Routine w reflex microscopic     Status: Abnormal   Collection Time: 08/10/20  5:36 PM  Result Value Ref Range   Color, Urine YELLOW YELLOW   APPearance CLEAR CLEAR   Specific Gravity, Urine 1.021 1.005 - 1.030   pH 7.0 5.0 - 8.0   Glucose, UA >=500 (A) NEGATIVE mg/dL   Hgb urine dipstick NEGATIVE NEGATIVE   Bilirubin Urine NEGATIVE NEGATIVE   Ketones, ur NEGATIVE NEGATIVE mg/dL   Protein, ur 08/12/20 (A) NEGATIVE mg/dL   Nitrite NEGATIVE NEGATIVE   Leukocytes,Ua NEGATIVE NEGATIVE   WBC, UA 0-5 0 - 5 WBC/hpf   Bacteria, UA NONE SEEN NONE SEEN   Squamous Epithelial / LPF 0-5  0 - 5   Mucus PRESENT   Pregnancy, urine POC     Status: Abnormal   Collection Time: 08/10/20  5:43 PM  Result Value Ref Range   Preg Test, Ur POSITIVE (A) NEGATIVE  CBC  Status: Abnormal   Collection Time: 08/10/20  6:56 PM  Result Value Ref Range   WBC 12.2 (H) 4.0 - 10.5 K/uL   RBC 5.06 3.87 - 5.11 MIL/uL   Hemoglobin 13.1 12.0 - 15.0 g/dL   HCT 40.1 36.0 - 46.0 %   MCV 79.2 (L) 80.0 - 100.0 fL   MCH 25.9 (L) 26.0 - 34.0 pg   MCHC 32.7 30.0 - 36.0 g/dL   RDW 12.4 11.5 - 15.5 %   Platelets 298 150 - 400 K/uL   nRBC 0.0 0.0 - 0.2 %  Comprehensive metabolic panel     Status: Abnormal   Collection Time: 08/10/20  6:56 PM  Result Value Ref Range   Sodium 136 135 - 145 mmol/L   Potassium 3.6 3.5 - 5.1 mmol/L   Chloride 100 98 - 111 mmol/L   CO2 26 22 - 32 mmol/L   Glucose, Bld 240 (H) 70 - 99 mg/dL   BUN 8 6 - 20 mg/dL   Creatinine, Ser 0.74 0.44 - 1.00 mg/dL   Calcium 9.4 8.9 - 10.3 mg/dL   Total Protein 7.4 6.5 - 8.1 g/dL   Albumin 3.8 3.5 - 5.0 g/dL   AST 14 (L) 15 - 41 U/L   ALT 14 0 - 44 U/L   Alkaline Phosphatase 42 38 - 126 U/L   Total Bilirubin 0.5 0.3 - 1.2 mg/dL   GFR, Estimated >60 >60 mL/min   Anion gap 10 5 - 15  Type and screen [place]     Status: None   Collection Time: 08/10/20  6:56 PM  Result Value Ref Range   ABO/RH(D) A POS    Antibody Screen NEG    Sample Expiration      08/13/2020,2359 Performed at [facility], [address] [place], [place] [address]   hCG, quantitative, pregnancy     Status: Abnormal   Collection Time: 08/10/20  7:27 PM  Result Value Ref Range   hCG, Beta Chain, Quant, S 1,088 (H) <5 mIU/mL   US OB LESS THAN 14 WEEKS WITH OB TRANSVAGINAL  Result Date: 08/10/2020 CLINICAL DATA:  Cramps lower abdominal pain EXAM: OBSTETRIC <14 WK [place] AND TRANSVAGINAL OB US TECHNIQUE: Both transabdominal and transvaginal ultrasound examinations were performed for complete evaluation of the gestation as well as  the maternal uterus, adnexal regions, and pelvic cul-de-sac. Transvaginal technique was performed to assess early pregnancy. COMPARISON:  None. FINDINGS: Intrauterine gestational sac: Single Yolk sac:  Not Visualized. Embryo:  Not Visualized. MSD: 3.6 mm   5 w   0 d Subchorionic hemorrhage:  None visualized. Maternal uterus/adnexae: Normal appearing ovaries. No free fluid seen within the cul-de-sac. IMPRESSION: Probable early intrauterine gestational sac, but no yolk sac, fetal pole, or cardiac activity yet visualized. Recommend follow-up quantitative B-HCG levels and follow-up US in 14 days to assess viability. This recommendation follows SRU consensus guidelines: Diagnostic Criteria for Nonviable Pregnancy Early in the First Trimester. [place] Med 2013; 748:2707-86. Electronically Signed   By: Prudencio Pair M.D.   On: 08/10/2020 19:46   Assessment and Plan  --23 y.o. G1P0000 at [redacted]w[redacted]d with pregnancy of unknown location --No concerning findings on physical exam --Pain resolved with Im Dilaudid, no recurrence of pain during evaluation in MAU --Discharge home in stable condition with ectopic precautions  F/U: --Return to MAU Thursday night after 1900 or Friday morning for repeat stat Quant hCG  Darlina Rumpf, CNM 08/10/2020, 8:40 PM

## 2020-08-10 NOTE — Discharge Instructions (Signed)
Human Chorionic Gonadotropin Test Why am I having this test? A human chorionic gonadotropin (hCG) test is done to determine whether you are pregnant. It can also be used:  To diagnose an abnormal pregnancy.  To determine whether you have had a failed pregnancy (miscarriage) or are at risk of one. What is being tested? This test checks the level of the human chorionic gonadotropin (hCG) hormone in the blood. This hormone is produced during pregnancy by the cells that form the placenta. The placenta is the organ that grows inside your womb (uterus) to nourish a developing baby. When you are pregnant, hCG can be detected in your blood or urine 7 to 8 days before your missed period. It continues to go up for the first 8-10 weeks of pregnancy. The presence of hCG in your blood can be measured with several different types of tests. You may have:  A urine test. ? Because this hormone is eliminated from your body by your kidneys, you may have a urine test to find out whether you are pregnant. A home pregnancy test detects whether there is hCG in your urine. ? A urine test only shows whether there is hCG in your urine. It does not measure how much.  A qualitative blood test. ? You may have this type of blood test to find out if you are pregnant. ? This blood test only shows whether there is hCG in your blood. It does not measure how much.  A quantitative blood test. ? This type of blood test measures the amount of hCG in your blood. ? You may have this test to:  Diagnose an abnormal pregnancy.  Check whether you have had a miscarriage.  Determine whether you are at risk of a miscarriage. What kind of sample is taken?     Two kinds of samples may be collected to test for the hCG hormone.  Blood. It is usually collected by inserting a needle into a blood vessel.  Urine. It is usually collected by urinating into a germ-free (sterile) specimen cup. It is best to collect the sample the first  time you urinate in the morning. How do I prepare for this test? No preparation is needed for a blood test.  For the urine test:  Let your health care provider know about: ? All medicines you are taking, including vitamins, herbs, creams, and over-the-counter medicines. ? Any blood in your urine. This may interfere with the result.  Do not drink too much fluid. Drink as you normally would, or as directed by your health care provider. How are the results reported? Depending on the type of test that you have, your test results may be reported as values. Your health care provider will compare your results to normal ranges that were established after testing a large group of people (reference ranges). Reference ranges may vary among labs and hospitals. For this test, common reference ranges that show absence of pregnancy are:  Quantitative hCG blood levels: less than 5 IU/L. Other results will be reported as either positive or negative. For this test, normal results (meaning the absence of pregnancy) are:  Negative for hCG in the urine test.  Negative for hCG in the qualitative blood test. What do the results mean? Urine and qualitative blood test  A negative result could mean: ? That you are not pregnant. ? That the test was done too early in your pregnancy to detect hCG in your blood or urine. If you still have other signs   of pregnancy, the test will be repeated.  A positive result means: ? That you are most likely pregnant. Your health care provider may confirm your pregnancy with an imaging study (ultrasound) of your uterus, if needed. Quantitative blood test Results of the quantitative hCG blood test will be interpreted as follows:  Less than 5 IU/L: You are most likely not pregnant.  Greater than 25 IU/L: You are most likely pregnant.  hCG levels that are higher than expected: ? You are pregnant with twins. ? You have abnormal growths in the uterus.  hCG levels that are  rising more slowly than expected: ? You have an ectopic pregnancy (also called a tubal pregnancy).  hCG levels that are falling: ? You may be having a miscarriage. Talk with your health care provider about what your results mean. Questions to ask your health care provider Ask your health care provider, or the department that is doing the test:  When will my results be ready?  How will I get my results?  What are my treatment options?  What other tests do I need?  What are my next steps? Summary  A human chorionic gonadotropin test is done to determine whether you are pregnant.  When you are pregnant, hCG can be detected in your blood or urine 7 to 8 days before your missed period. It continues to go up for the first 8-10 weeks of pregnancy.  Your hCG level can be measured with different types of tests. You may have a urine test, a qualitative blood test, or a quantitative blood test.  Talk with your health care provider about what your results mean. This information is not intended to replace advice given to you by your health care provider. Make sure you discuss any questions you have with your health care provider. Document Revised: 07/02/2017 Document Reviewed: 07/02/2017 Elsevier Patient Education  2020 [employer]  

## 2020-08-11 ENCOUNTER — Ambulatory Visit (INDEPENDENT_AMBULATORY_CARE_PROVIDER_SITE_OTHER): Payer: Medicaid Other | Admitting: Primary Care

## 2020-08-12 ENCOUNTER — Ambulatory Visit (HOSPITAL_COMMUNITY)
Admission: EM | Admit: 2020-08-12 | Discharge: 2020-08-12 | Disposition: A | Discharge status: Home / Self Care | Payer: HRSA Program | Attending: Urgent Care | Admitting: Urgent Care

## 2020-08-12 ENCOUNTER — Encounter (HOSPITAL_COMMUNITY): Payer: Self-pay | Admitting: Emergency Medicine

## 2020-08-12 ENCOUNTER — Other Ambulatory Visit: Payer: Self-pay

## 2020-08-12 DIAGNOSIS — Z20822 Contact with and (suspected) exposure to covid-19: Secondary | ICD-10-CM | POA: Diagnosis not present

## 2020-08-12 DIAGNOSIS — M797 Fibromyalgia: Secondary | ICD-10-CM | POA: Insufficient documentation

## 2020-08-12 DIAGNOSIS — O99511 Diseases of the respiratory system complicating pregnancy, first trimester: Secondary | ICD-10-CM | POA: Diagnosis not present

## 2020-08-12 DIAGNOSIS — O99281 Endocrine, nutritional and metabolic diseases complicating pregnancy, first trimester: Secondary | ICD-10-CM | POA: Insufficient documentation

## 2020-08-12 DIAGNOSIS — Z794 Long term (current) use of insulin: Secondary | ICD-10-CM | POA: Insufficient documentation

## 2020-08-12 DIAGNOSIS — O24111 Pre-existing diabetes mellitus, type 2, in pregnancy, first trimester: Secondary | ICD-10-CM | POA: Insufficient documentation

## 2020-08-12 DIAGNOSIS — E7800 Pure hypercholesterolemia, unspecified: Secondary | ICD-10-CM | POA: Diagnosis not present

## 2020-08-12 DIAGNOSIS — R519 Headache, unspecified: Secondary | ICD-10-CM | POA: Insufficient documentation

## 2020-08-12 DIAGNOSIS — E1165 Type 2 diabetes mellitus with hyperglycemia: Secondary | ICD-10-CM | POA: Diagnosis not present

## 2020-08-12 DIAGNOSIS — Z87891 Personal history of nicotine dependence: Secondary | ICD-10-CM | POA: Insufficient documentation

## 2020-08-12 DIAGNOSIS — O161 Unspecified maternal hypertension, first trimester: Secondary | ICD-10-CM | POA: Insufficient documentation

## 2020-08-12 DIAGNOSIS — Z79899 Other long term (current) drug therapy: Secondary | ICD-10-CM | POA: Insufficient documentation

## 2020-08-12 DIAGNOSIS — O26891 Other specified pregnancy related conditions, first trimester: Secondary | ICD-10-CM | POA: Diagnosis not present

## 2020-08-12 DIAGNOSIS — J45909 Unspecified asthma, uncomplicated: Secondary | ICD-10-CM | POA: Diagnosis not present

## 2020-08-12 DIAGNOSIS — Z3A01 Less than 8 weeks gestation of pregnancy: Secondary | ICD-10-CM | POA: Diagnosis not present

## 2020-08-12 DIAGNOSIS — R0789 Other chest pain: Secondary | ICD-10-CM | POA: Insufficient documentation

## 2020-08-12 LAB — SARS CORONAVIRUS 2 (TAT 6-24 HRS): SARS Coronavirus 2: NEGATIVE

## 2020-08-12 NOTE — ED Provider Notes (Signed)
[facility]   MRN: [id number] DOB: Sep 25, 1995  Subjective:   Tamara Reyes is a 24 y.o. female presenting for 2-day history of intermittent headaches, chest pains.  Patient states that she has been feeling hot flashes.  Has had some weakness and feels general malaise, fatigue, and lymph node irritation.  Patient is [redacted] weeks pregnant, just found out about this.  Has been using ibuprofen for her symptoms without relief.  Has a history of fibromyalgia.  She is also an uncontrolled diabetic.  Patient is not currently smoking.  Had recent exposure to multiple family members that turned out to have COVID-19, states that it was the new omicron variant.  No current facility-administered medications for this encounter.  Current Outpatient Medications:  .  atorvastatin (LIPITOR) 20 MG tablet, Take 1 tablet (20 mg total) by mouth daily., Disp: 90 tablet, Rfl: 1 .  Dulaglutide (TRULICITY) 0.75 MG/0.5ML SOPN, Inject 0.75 mg into the skin once a week., Disp: 0.5 mL, Rfl: 3 .  escitalopram (LEXAPRO) 10 MG tablet, Take 1 tablet (10 mg total) by mouth daily., Disp: 90 tablet, Rfl: 1 .  hydrOXYzine (ATARAX/VISTARIL) 25 MG tablet, Take 1 tablet (25 mg total) by mouth at bedtime., Disp: 30 tablet, Rfl: 1 .  Insulin Pen Needle (PEN NEEDLES) 31G X 8 MM MISC, Inject 1 application into the skin at bedtime., Disp: 100 each, Rfl: 3 .  lisinopril (ZESTRIL) 2.5 MG tablet, Take 2 tablets (5 mg total) by mouth daily., Disp: 90 tablet, Rfl: 1 .  metFORMIN (GLUCOPHAGE) 1000 MG tablet, Take 1 tablet (1,000 mg total) by mouth 2 (two) times daily with a meal., Disp: 180 tablet, Rfl: 3 .  omeprazole (PRILOSEC) 20 MG capsule, Take 1 capsule (20 mg total) by mouth daily., Disp: 30 capsule, Rfl: 3 .  polyethylene glycol (MIRALAX / GLYCOLAX) packet, Take 17 g by mouth as needed for mild constipation. , Disp: , Rfl:  .  senna (SENOKOT) 8.6 MG TABS tablet, Take 1 tablet (8.6 mg total) by mouth daily as needed for mild  constipation. (Patient not taking: Reported on 02/23/2020), Disp: 120 tablet, Rfl: 0   No Known Allergies  Past Medical History:  Diagnosis Date  . Asthma   . Diabetes mellitus   . Fibromyalgia   . Genital herpes   . Hypercholesteremia   . Hypertension   . PCOS (polycystic ovarian syndrome)      Past Surgical History:  Procedure Laterality Date  . abscess removal     . TONSILLECTOMY    . TONSILLECTOMY AND ADENOIDECTOMY     tonsils only    Family History  Problem Relation Age of Onset  . Hypertension Mother   . Diabetes Mother        gestational  . Hypertension Maternal Grandmother   . Hypertension Maternal Grandfather   . Hypertension Paternal Grandmother   . Diabetes Paternal Grandmother     Social History   Tobacco Use  . Smoking status: Former Smoker    Types: Cigarettes    Quit date: 05/28/2014    Years since quitting: 6.2  . Smokeless tobacco: Never Used  Vaping Use  . Vaping Use: Some days  Substance Use Topics  . Alcohol use: No  . Drug use: Yes    Types: Marijuana    ROS   Objective:   Vitals: BP 124/76 (BP Location: Right Arm)   Pulse 81   Temp 98.3 F (36.8 C) (Oral)   Resp 16   Ht  5\' 8"  (1.727 m)   Wt 166 lb (75.3 kg)   LMP 07/09/2020   SpO2 100%   BMI 25.24 kg/m   Physical Exam Constitutional:      General: She is not in acute distress.    Appearance: Normal appearance. She is well-developed. She is not ill-appearing, toxic-appearing or diaphoretic.     Comments: Patient is well-appearing, cheerful.  HENT:     Head: Normocephalic and atraumatic.     Nose: Nose normal.     Mouth/Throat:     Mouth: Mucous membranes are moist.     Pharynx: Oropharynx is clear.  Eyes:     General: No scleral icterus.       Right eye: No discharge.        Left eye: No discharge.     Extraocular Movements: Extraocular movements intact.     Conjunctiva/sclera: Conjunctivae normal.     Pupils: Pupils are equal, round, and reactive to light.   Cardiovascular:     Rate and Rhythm: Normal rate.  Pulmonary:     Effort: Pulmonary effort is normal.     Comments: Patient is not in respiratory distress, not using accessory muscles for breathing, no pursed or cyanotic lips. Skin:    General: Skin is warm and dry.  Neurological:     General: No focal deficit present.     Mental Status: She is alert and oriented to person, place, and time.  Psychiatric:        Mood and Affect: Mood normal.        Behavior: Behavior normal.        Thought Content: Thought content normal.        Judgment: Judgment normal.     Assessment and Plan :   PDMP not reviewed this encounter.  1. Acute nonintractable headache, unspecified headache type   2. Atypical chest pain     Counseled against use of ibuprofen in pregnancy.  Recommended medications for supportive care that are safe.  COVID-19 testing is pending, high suspicion for this given her close exposure. Counseled patient on potential for adverse effects with medications prescribed/recommended today, ER and return-to-clinic precautions discussed, patient verbalized understanding.    Jaynee Eagles, [place] 08/12/20 [id number]

## 2020-08-12 NOTE — ED Triage Notes (Addendum)
Patient c/o headache and chest pain x 3 days.   Patient endorses being evaluated at [facility].    Patient endorses having "hot flashed".  Patient endorses weakness. Patient endorses lymph node swelling.   Patient has taken Ibuprofen with no relief of symptoms.    Patient is pregnant (about 4 weeks).   History of Fibromyalgia.

## 2020-08-12 NOTE — Discharge Instructions (Signed)
We will notify you of your COVID-19 test results as they arrive and may take between 24 to 48 hours.  I encourage you to sign up for MyChart if you have not already done so as this can be the easiest way for [place] to communicate results to you online or through a phone app.  In the meantime, if you develop worsening symptoms including fever, chest pain, shortness of breath despite our current treatment plan then please report to the emergency room as this may be a sign of worsening status from possible COVID-19 infection.  Otherwise, we will manage this as a viral syndrome. For sore throat or cough try using a honey-based tea. Use 3 teaspoons of honey with juice squeezed from half lemon. Place shaved pieces of ginger into 1/2-1 cup of water and warm over stove top. Then mix the ingredients and repeat every 4 hours as needed. Please take Tylenol 500mg -650mg  every 6 hours for aches and pains, fevers. Do not use any nonsteroidal anti-inflammatories (NSAIDs) like ibuprofen, Motrin, naproxen, Aleve, etc. which are all available over-the-counter.  Hydrate very well with at least 2 liters of water. Eat light meals such as soups to replenish electrolytes and soft fruits, veggies. Start an antihistamine like Zyrtec, Allegra or Claritin for postnasal drainage, sinus congestion.

## 2020-08-13 ENCOUNTER — Telehealth: Payer: Self-pay | Admitting: Advanced Practice Midwife

## 2020-08-13 NOTE — Telephone Encounter (Signed)
Sent MyChart message " Please return to [facility] as soon as possible for [place] to check your pregnancy hormone level. We were not able to determine where your pregnancy is growing at your MAU visit on 12/28. Abdominal pain in early pregnancy can be a symptom of ectopic pregnancy which is a potentially life-threatening condition. "

## 2020-08-14 NOTE — L&D Delivery Note (Signed)
OB/[facility]N Faculty [facility] Delivery Note  Tamara Reyes is a 25 y.o. G1P1001 s/p NSVD at [redacted]w[redacted]d She was admitted for IOL secondary to cHTN and T2DM and ultimately developed SIPE with severe features for which she was started on magnesium.  ROM: 10h 522mith clear fluid GBS Status: Positive; PCN  Delivery Date/Time: 03/29/21 at 2218  Delivery: Called to room and patient was complete and pushing. Head delivered LOA. Loose nuchal cord present and easily reduced at the perineum. Shoulder and body delivered in usual fashion. Infant with spontaneous cry, placed on mother's abdomen, dried and stimulated. Cord clamped x 2 after 1-minute delay, and cut by FOB under my direct supervision. Cord blood drawn. Placenta delivered spontaneously with gentle cord traction. Fundus firm with massage and Pitocin. Labia, perineum, vagina, and cervix were inspected. Right periurethral laceration noted that was hemostatic and not repaired.  Placenta: Intact; 3 vessel cord Complications: None Lacerations: Right periurethral; hemostatic and not repaired EBL: 100 cc Analgesia: Epidural  Infant: Viable female  APGARs 9 35nd 9 16ChVilma MeckelMD OB/GYN Fellow, Faculty Practice

## 2020-08-16 ENCOUNTER — Encounter (HOSPITAL_COMMUNITY): Payer: Self-pay | Admitting: Family Medicine

## 2020-08-16 ENCOUNTER — Inpatient Hospital Stay (HOSPITAL_COMMUNITY): Payer: Medicaid Other

## 2020-08-16 ENCOUNTER — Inpatient Hospital Stay (HOSPITAL_COMMUNITY)
Admission: AD | Admit: 2020-08-16 | Discharge: 2020-08-16 | Disposition: A | Discharge status: Home / Self Care | Payer: Medicaid Other | Attending: Family Medicine | Admitting: Family Medicine

## 2020-08-16 ENCOUNTER — Other Ambulatory Visit: Payer: Self-pay

## 2020-08-16 ENCOUNTER — Other Ambulatory Visit: Payer: Self-pay | Admitting: Certified Nurse Midwife

## 2020-08-16 DIAGNOSIS — O98511 Other viral diseases complicating pregnancy, first trimester: Secondary | ICD-10-CM

## 2020-08-16 DIAGNOSIS — F418 Other specified anxiety disorders: Secondary | ICD-10-CM

## 2020-08-16 DIAGNOSIS — Z3A01 Less than 8 weeks gestation of pregnancy: Secondary | ICD-10-CM | POA: Insufficient documentation

## 2020-08-16 DIAGNOSIS — Z794 Long term (current) use of insulin: Secondary | ICD-10-CM | POA: Diagnosis not present

## 2020-08-16 DIAGNOSIS — Z87891 Personal history of nicotine dependence: Secondary | ICD-10-CM | POA: Diagnosis not present

## 2020-08-16 DIAGNOSIS — O98311 Other infections with a predominantly sexual mode of transmission complicating pregnancy, first trimester: Secondary | ICD-10-CM | POA: Diagnosis not present

## 2020-08-16 DIAGNOSIS — O99341 Other mental disorders complicating pregnancy, first trimester: Secondary | ICD-10-CM | POA: Diagnosis not present

## 2020-08-16 DIAGNOSIS — O99611 Diseases of the digestive system complicating pregnancy, first trimester: Secondary | ICD-10-CM

## 2020-08-16 DIAGNOSIS — O24111 Pre-existing diabetes mellitus, type 2, in pregnancy, first trimester: Secondary | ICD-10-CM

## 2020-08-16 DIAGNOSIS — K219 Gastro-esophageal reflux disease without esophagitis: Secondary | ICD-10-CM | POA: Diagnosis not present

## 2020-08-16 DIAGNOSIS — B009 Herpesviral infection, unspecified: Secondary | ICD-10-CM

## 2020-08-16 DIAGNOSIS — R0602 Shortness of breath: Secondary | ICD-10-CM | POA: Diagnosis not present

## 2020-08-16 DIAGNOSIS — O99891 Other specified diseases and conditions complicating pregnancy: Secondary | ICD-10-CM

## 2020-08-16 DIAGNOSIS — E119 Type 2 diabetes mellitus without complications: Secondary | ICD-10-CM

## 2020-08-16 DIAGNOSIS — U071 COVID-19: Secondary | ICD-10-CM | POA: Diagnosis not present

## 2020-08-16 DIAGNOSIS — A6000 Herpesviral infection of urogenital system, unspecified: Secondary | ICD-10-CM | POA: Diagnosis not present

## 2020-08-16 DIAGNOSIS — Z760 Encounter for issue of repeat prescription: Secondary | ICD-10-CM | POA: Diagnosis not present

## 2020-08-16 HISTORY — DX: COVID-19: U07.1

## 2020-08-16 LAB — COMPREHENSIVE METABOLIC PANEL
ALT: 14 U/L (ref 0–44)
AST: 16 U/L (ref 15–41)
Albumin: 3.5 g/dL (ref 3.5–5.0)
Alkaline Phosphatase: 38 U/L (ref 38–126)
Anion gap: 8 (ref 5–15)
BUN: 8 mg/dL (ref 6–20)
CO2: 26 mmol/L (ref 22–32)
Calcium: 8.8 mg/dL — ABNORMAL LOW (ref 8.9–10.3)
Chloride: 100 mmol/L (ref 98–111)
Creatinine, Ser: 0.72 mg/dL (ref 0.44–1.00)
GFR, Estimated: >60 mL/min (ref >60)
Glucose, Bld: 232 mg/dL — ABNORMAL HIGH (ref 70–99)
Potassium: 4.1 mmol/L (ref 3.5–5.1)
Sodium: 134 mmol/L — ABNORMAL LOW (ref 135–145)
Total Bilirubin: 0.5 mg/dL (ref 0.3–1.2)
Total Protein: 6.9 g/dL (ref 6.5–8.1)

## 2020-08-16 LAB — URINALYSIS, ROUTINE W REFLEX MICROSCOPIC
Bacteria, UA: NONE SEEN
Bilirubin Urine: NEGATIVE
Glucose, UA: >=500 mg/dL — AB
Hgb urine dipstick: NEGATIVE
Ketones, ur: NEGATIVE mg/dL
Leukocytes,Ua: NEGATIVE
Nitrite: NEGATIVE
Protein, ur: NEGATIVE mg/dL
Specific Gravity, Urine: 1.021 (ref 1.005–1.030)
pH: 6 (ref 5.0–8.0)

## 2020-08-16 LAB — CBC
HCT: 37.7 % (ref 36.0–46.0)
Hemoglobin: 12.2 g/dL (ref 12.0–15.0)
MCH: 25.8 pg — ABNORMAL LOW (ref 26.0–34.0)
MCHC: 32.4 g/dL (ref 30.0–36.0)
MCV: 79.9 fL — ABNORMAL LOW (ref 80.0–100.0)
Platelets: 214 10*3/uL (ref 150–400)
RBC: 4.72 MIL/uL (ref 3.87–5.11)
RDW: 12.5 % (ref 11.5–15.5)
WBC: 5.9 10*3/uL (ref 4.0–10.5)
nRBC: 0 % (ref 0.0–0.2)

## 2020-08-16 LAB — RESP PANEL BY RT-PCR (RSV, FLU A&B, COVID)  RVPGX2
Influenza A by PCR: NEGATIVE
Influenza B by PCR: NEGATIVE
Resp Syncytial Virus by PCR: NEGATIVE
SARS Coronavirus 2 by RT PCR: POSITIVE — AB

## 2020-08-16 LAB — HCG, QUANTITATIVE, PREGNANCY: hCG, Beta Chain, Quant, S: 8678 m[IU]/mL — ABNORMAL HIGH (ref <5)

## 2020-08-16 LAB — C-REACTIVE PROTEIN: CRP: 0.6 mg/dL (ref <1.0)

## 2020-08-16 LAB — D-DIMER, QUANTITATIVE: D-Dimer, Quant: <0.27 ug/mL-FEU (ref 0.00–0.50)

## 2020-08-16 MED ORDER — ALUM & MAG HYDROXIDE-SIMETH 200-200-20 MG/5ML PO SUSP
30.0000 mL | Freq: Once | ORAL | Status: AC
  Administered 2020-08-16: 30 mL via ORAL
  Filled 2020-08-16: qty 30

## 2020-08-16 MED ORDER — CYCLOBENZAPRINE HCL 5 MG PO TABS
10.0000 mg | ORAL_TABLET | Freq: Once | ORAL | Status: AC
  Administered 2020-08-16: 10 mg via ORAL
  Filled 2020-08-16: qty 2

## 2020-08-16 MED ORDER — INSULIN NPH (HUMAN) (ISOPHANE) 100 UNIT/ML ~~LOC~~ SUSP: 10.0000 [IU] | Freq: Two times a day (BID) | SUBCUTANEOUS | 3 refills | Status: DC

## 2020-08-16 MED ORDER — VALACYCLOVIR HCL 500 MG PO TABS: 500.0000 mg | ORAL_TABLET | Freq: Two times a day (BID) | ORAL | 6 refills | Status: DC

## 2020-08-16 MED ORDER — OMEPRAZOLE 20 MG PO CPDR: 20.0000 mg | DELAYED_RELEASE_CAPSULE | Freq: Two times a day (BID) | ORAL | 3 refills | Status: DC

## 2020-08-16 MED ORDER — LIDOCAINE VISCOUS HCL 2 % MT SOLN
15.0000 mL | Freq: Once | OROMUCOSAL | Status: AC
  Administered 2020-08-16: 15 mL via ORAL
  Filled 2020-08-16: qty 15

## 2020-08-16 MED ORDER — HYDROXYZINE HCL 25 MG PO TABS: 25.0000 mg | ORAL_TABLET | Freq: Every day | ORAL | 1 refills | Status: DC

## 2020-08-16 MED ORDER — ACETAMINOPHEN 500 MG PO TABS
1000.0000 mg | ORAL_TABLET | Freq: Once | ORAL | Status: AC
  Administered 2020-08-16: 1000 mg via ORAL
  Filled 2020-08-16: qty 2

## 2020-08-16 MED ORDER — DICYCLOMINE HCL 10 MG PO CAPS
10.0000 mg | ORAL_CAPSULE | Freq: Once | ORAL | Status: AC
  Administered 2020-08-16: 10 mg via ORAL
  Filled 2020-08-16: qty 1

## 2020-08-16 MED ORDER — ESCITALOPRAM OXALATE 10 MG PO TABS: 10.0000 mg | ORAL_TABLET | Freq: Every day | ORAL | 1 refills | Status: DC

## 2020-08-16 MED ORDER — INSULIN ASPART 100 UNIT/ML ~~LOC~~ SOLN: 8.0000 [IU] | Freq: Three times a day (TID) | SUBCUTANEOUS | 12 refills | Status: DC

## 2020-08-16 MED FILL — !NOVOLOG 100UNITS/ML VIAL: 100/ML | 28 days supply | Qty: 10 | Fill #0

## 2020-08-16 MED FILL — HumuLIN N 100 UNIT/ML SUSP: 100 | 28 days supply | Qty: 10 | Fill #0

## 2020-08-16 MED FILL — OMEPRAZOLE 20 MG CAP: 20 | 15 days supply | Qty: 30 | Fill #0

## 2020-08-16 MED FILL — ESCITALOPRAM 10 MG TABLET: 10 | 30 days supply | Qty: 30 | Fill #0

## 2020-08-16 MED FILL — hydrOXYzine HCL 25 MG TABS: 25 | 30 days supply | Qty: 30 | Fill #0

## 2020-08-16 MED FILL — VALACYCLOVIR HCL 500 MG TAB: 500 | 30 days supply | Qty: 60 | Fill #0

## 2020-08-16 NOTE — MAU Note (Signed)
Presents with c/o chest pain centrally  Located, states feel tightness.  Also c/o SOB and upper abdominal pain.  Reports symptoms began last Sunday, August 08, 2020.  States had negative Covid test on Friday.

## 2020-08-16 NOTE — Discharge Instructions (Signed)
10 Things You Can Do to Manage Your COVID-19 Symptoms at Home If you have possible or confirmed COVID-19: 1. Stay home from work and school. And stay away from other public places. If you must go out, avoid using any kind of public transportation, ridesharing, or taxis. 2. Monitor your symptoms carefully. If your symptoms get worse, call your healthcare provider immediately. 3. Get rest and stay hydrated. 4. If you have a medical appointment, call the healthcare provider ahead of time and tell them that you have or may have COVID-19. 5. For medical emergencies, call 911 and notify the dispatch personnel that you have or may have COVID-19. 6. Cover your cough and sneezes with a tissue or use the inside of your elbow. 7. Wash your hands often with soap and water for at least 20 seconds or clean your hands with an alcohol-based hand sanitizer that contains at least 60% alcohol. 8. As much as possible, stay in a specific room and away from other people in your home. Also, you should use a separate bathroom, if available. If you need to be around other people in or outside of the home, wear a mask. 9. Avoid sharing personal items with other people in your household, like dishes, towels, and bedding. 10. Clean all surfaces that are touched often, like counters, tabletops, and doorknobs. Use household cleaning sprays or wipes according to the label instructions. [email or web address] 02/12/2019 This information is not intended to replace advice given to you by your health care provider. Make sure you discuss any questions you have with your health care provider. Document Revised: 07/17/2019 Document Reviewed: 07/17/2019 Elsevier Patient Education  2020 [employer] Inc[employer]  Genital Herpes Genital herpes is a common sexually transmitted infection (STI) that is caused by a virus. The virus spreads from person to person through sexual contact. Infection can cause itching, blisters, and sores around the genitals or  rectum. Symptoms may last several days and then go away This is called an outbreak. However, the virus remains in your body, so you may have more outbreaks in the future. The time between outbreaks varies and can be months or years. Genital herpes affects men and women. It is particularly concerning for pregnant women because the virus can be passed to the baby during delivery and can cause serious problems. Genital herpes is also a concern for people who have a weak disease-fighting (immune) system. What are the causes? This condition is caused by the herpes simplex virus (HSV) type 1 or type 2. The virus may spread through:  Sexual contact with an infected person, including vaginal, anal, and oral sex.  Contact with fluid from a herpes sore.  The skin. This means that you can get herpes from an infected partner even if he or she does not have a visible sore or does not know that he or she is infected. What increases the risk? You are more likely to develop this condition if:  You have sex with many partners.  You do not use latex condoms during sex. What are the signs or symptoms? Most people do not have symptoms (asymptomatic) or have mild symptoms that may be mistaken for other skin problems. Symptoms may include:  Small red bumps near the genitals, rectum, or mouth. These bumps turn into blisters and then turn into sores.  Flu-like symptoms, including: ? Fever. ? Body aches. ? Swollen lymph nodes. ? Headache.  Painful urination.  Pain and itching in the genital area or rectal area.  Vaginal discharge.  Tingling or shooting pain in the legs and buttocks. Generally, symptoms are more severe and last longer during the first (primary) outbreak. Flu-like symptoms are also more common during the primary outbreak. How is this diagnosed? Genital herpes may be diagnosed based on:  A physical exam.  Your medical history.  Blood tests.  A test of a fluid sample (culture) from an  open sore. How is this treated? There is no cure for this condition, but treatment with antiviral medicines that are taken by mouth (orally) can do the following:  Speed up healing and relieve symptoms.  Help to reduce the spread of the virus to sexual partners.  Limit the chance of future outbreaks, or make future outbreaks shorter.  Lessen symptoms of future outbreaks. Your health care provider may also recommend pain relief medicines, such as aspirin or ibuprofen. Follow these instructions at home: Sexual activity  Do not have sexual contact during active outbreaks.  Practice safe sex. Latex condoms and female condoms may help prevent the spread of the herpes virus. General instructions  Keep the affected areas dry and clean.  Take over-the-counter and prescription medicines only as told by your health care provider.  Avoid rubbing or touching blisters and sores. If you do touch blisters or sores: ? Wash your hands thoroughly with soap and water. ? Do not touch your eyes afterward.  To help relieve pain or itching, you may take the following actions as directed by your health care provider: ? Apply a cold, wet cloth (cold compress) to affected areas 4-6 times a day. ? Apply a substance that protects your skin and reduces bleeding (astringent). ? Apply a gel that helps relieve pain around sores (lidocaine gel). ? Take a warm, shallow bath that cleans the genital area (sitz bath).  Keep all follow-up visits as told by your health care provider. This is important. How is this prevented?  Use condoms. Although anyone can get genital herpes during sexual contact, even with the use of a condom, a condom can provide some protection.  Avoid having multiple sexual partners.  Talk with your sexual partner about any symptoms either of you may have. Also, talk with your partner about any history of STIs.  Get tested for STIs before you have sex. Ask your partner to do the  same.  Do not have sexual contact if you have symptoms of genital herpes. Contact a health care provider if:  Your symptoms are not improving with medicine.  Your symptoms return.  You have new symptoms.  You have a fever.  You have abdominal pain.  You have redness, swelling, or pain in your eye.  You notice new sores on other parts of your body.  You are a woman and experience bleeding between menstrual periods.  You have had herpes and you become pregnant or plan to become pregnant. Summary  Genital herpes is a common sexually transmitted infection (STI) that is caused by the herpes simplex virus (HSV) type 1 or type 2.  These viruses are most often spread through sexual contact with an infected person.  You are more likely to develop this condition if you have sex with many partners or you have unprotected sex.  Most people do not have symptoms (asymptomatic) or have mild symptoms that may be mistaken for other skin problems. Symptoms occur as outbreaks that may happen months or years apart.  There is no cure for this condition, but treatment with oral antiviral medicines can reduce symptoms, reduce the chance  of spreading the virus to a partner, prevent future outbreaks, or shorten future outbreaks. This information is not intended to replace advice given to you by your health care provider. Make sure you discuss any questions you have with your health care provider. Document Revised: 02/04/2018 Document Reviewed: 06/30/2016 Elsevier Patient Education  [place].

## 2020-08-16 NOTE — MAU Provider Note (Signed)
Chief Complaint:  Abdominal Pain and Chest pain   Event Date/Time   First Provider Initiated Contact with Patient 08/16/20 1109     HPI: Tamara Reyes is a 25 y.o. G1P0000 at [redacted]w[redacted]d who presents to maternity admissions reporting generalized fatigue, central chest pain, SOB at rest that worsens with talking or activity as well as upper abdominal pain just under her ribs. Her symptoms began upon waking today. Tamara Reyes has a history of GERD and is taking omeprazole daily. Tamara Reyes had a Covid test on 08/13/20 that was negative. Denies vaginal bleeding or lower abdominal cramping.   Also reports "odd sensation" on vulva and wants it examined.  Past Medical History:  Diagnosis Date  . Asthma   . Diabetes mellitus   . Fibromyalgia   . Genital herpes   . Hypercholesteremia   . Hypertension   . PCOS (polycystic ovarian syndrome)    OB History  Gravida Para Term Preterm AB Living  1 0 0 0 0 0  SAB IAB Ectopic Multiple Live Births  0 0 0 0      # Outcome Date GA Lbr Len/2nd Weight Sex Delivery Anes PTL Lv  1 Current            Past Surgical History:  Procedure Laterality Date  . abscess removal     . TONSILLECTOMY    . TONSILLECTOMY AND ADENOIDECTOMY     tonsils only   Family History  Problem Relation Age of Onset  . Hypertension Mother   . Diabetes Mother   . Hypertension Maternal Grandmother   . Hypertension Maternal Grandfather   . Hypertension Paternal Grandmother   . Diabetes Paternal Grandmother    Social History   Tobacco Use  . Smoking status: Former Smoker    Types: Cigarettes    Quit date: 05/28/2014    Years since quitting: 6.2  . Smokeless tobacco: Never Used  Vaping Use  . Vaping Use: Some days  Substance Use Topics  . Alcohol use: No  . Drug use: Not Currently    Types: Marijuana    Comment: stopped once pregnant   No Known Allergies Medications Prior to Admission  Medication Sig Dispense Refill Last Dose  . [DISCONTINUED] omeprazole (PRILOSEC) 20 MG capsule Take 1  capsule (20 mg total) by mouth daily. 30 capsule 3 08/15/2020 at Unknown time  . atorvastatin (LIPITOR) 20 MG tablet Take 1 tablet (20 mg total) by mouth daily. 90 tablet 1   . Dulaglutide (TRULICITY) A999333 0000000 SOPN Inject 0.75 mg into the skin once a week. 0.5 mL 3   . Insulin Pen Needle (PEN NEEDLES) 31G X 8 MM MISC Inject 1 application into the skin at bedtime. 100 each 3   . lisinopril (ZESTRIL) 2.5 MG tablet Take 2 tablets (5 mg total) by mouth daily. 90 tablet 1   . metFORMIN (GLUCOPHAGE) 1000 MG tablet Take 1 tablet (1,000 mg total) by mouth 2 (two) times daily with a meal. 180 tablet 3   . polyethylene glycol (MIRALAX / GLYCOLAX) packet Take 17 g by mouth as needed for mild constipation.      . senna (SENOKOT) 8.6 MG TABS tablet Take 1 tablet (8.6 mg total) by mouth daily as needed for mild constipation. (Patient not taking: Reported on 02/23/2020) 120 tablet 0   . [DISCONTINUED] escitalopram (LEXAPRO) 10 MG tablet Take 1 tablet (10 mg total) by mouth daily. 90 tablet 1   . [DISCONTINUED] hydrOXYzine (ATARAX/VISTARIL) 25 MG tablet Take 1 tablet (25 mg  total) by mouth at bedtime. 30 tablet 1    I have reviewed patient's Past Medical Hx, Surgical Hx, Family Hx, Social Hx, medications and allergies.   ROS:  Review of Systems  Constitutional: Positive for fatigue. Negative for fever.  Respiratory: Positive for chest tightness and shortness of breath. Negative for cough, choking, wheezing and stridor.   Cardiovascular: Positive for chest pain.  Gastrointestinal: Positive for abdominal pain. Negative for diarrhea, nausea and vomiting.  Genitourinary: Negative for pelvic pain.  Musculoskeletal: Negative for back pain.  Neurological: Negative for dizziness, syncope, light-headedness and headaches.  All other systems reviewed and are negative.   Physical Exam   Patient Vitals for the past 24 hrs:  BP Temp Temp src Pulse Resp SpO2  08/16/20 1326 133/90 98.1 F (36.7 C) Oral 78 18 100 %   08/16/20 1100 126/80 -- -- 81 20 100 %  08/16/20 1051 -- -- -- -- -- 99 %  08/16/20 1046 118/80 97.6 F (36.4 C) Oral 87 18 100 %   Constitutional: Well-developed, well-nourished female in no acute distress.  Cardiovascular: normal rate & rhythm, no murmur Respiratory: normal effort, lung sounds clear throughout GI: Abd soft, non-tender, gravid appropriate for gestational age. Pos BS x 4 MS: Extremities nontender, no edema, normal ROM Neurologic: Alert and oriented x 4.  GU: no CVA tenderness Pelvic: one small ulcerated area noted on the inner labia about 1.5cm between the clitoris and urethral opening, very tender to the touch.   Labs: Results for orders placed or performed during the hospital encounter of 08/16/20 (from the past 24 hour(s))  CBC     Status: Abnormal   Collection Time: 08/16/20 10:39 AM  Result Value Ref Range   WBC 5.9 4.0 - 10.5 K/uL   RBC 4.72 3.87 - 5.11 MIL/uL   Hemoglobin 12.2 12.0 - 15.0 g/dL   HCT 25.4 27.0 - 62.3 %   MCV 79.9 (L) 80.0 - 100.0 fL   MCH 25.8 (L) 26.0 - 34.0 pg   MCHC 32.4 30.0 - 36.0 g/dL   RDW 76.2 83.1 - 51.7 %   Platelets 214 150 - 400 K/uL   nRBC 0.0 0.0 - 0.2 %  Comprehensive metabolic panel     Status: Abnormal   Collection Time: 08/16/20 10:39 AM  Result Value Ref Range   Sodium 134 (L) 135 - 145 mmol/L   Potassium 4.1 3.5 - 5.1 mmol/L   Chloride 100 98 - 111 mmol/L   CO2 26 22 - 32 mmol/L   Glucose, Bld 232 (H) 70 - 99 mg/dL   BUN 8 6 - 20 mg/dL   Creatinine, Ser 6.16 0.44 - 1.00 mg/dL   Calcium 8.8 (L) 8.9 - 10.3 mg/dL   Total Protein 6.9 6.5 - 8.1 g/dL   Albumin 3.5 3.5 - 5.0 g/dL   AST 16 15 - 41 U/L   ALT 14 0 - 44 U/L   Alkaline Phosphatase 38 38 - 126 U/L   Total Bilirubin 0.5 0.3 - 1.2 mg/dL   GFR, Estimated >07 >37 mL/min   Anion gap 8 5 - 15  C-reactive protein     Status: None   Collection Time: 08/16/20 10:39 AM  Result Value Ref Range   CRP 0.6 <1.0 mg/dL  D-dimer, quantitative (not at [facility])      Status: None   Collection Time: 08/16/20 10:39 AM  Result Value Ref Range   D-Dimer, Quant <0.27 0.00 - 0.50 ug/mL-FEU  hCG, quantitative, pregnancy  Status: Abnormal   Collection Time: 08/16/20 10:39 AM  Result Value Ref Range   hCG, Beta Chain, Quant, S 8,678 (H) <5 mIU/mL  Urinalysis, Routine w reflex microscopic Urine, Clean Catch     Status: Abnormal   Collection Time: 08/16/20 10:55 AM  Result Value Ref Range   Color, Urine YELLOW YELLOW   APPearance CLEAR CLEAR   Specific Gravity, Urine 1.021 1.005 - 1.030   pH 6.0 5.0 - 8.0   Glucose, UA >=500 (A) NEGATIVE mg/dL   Hgb urine dipstick NEGATIVE NEGATIVE   Bilirubin Urine NEGATIVE NEGATIVE   Ketones, ur NEGATIVE NEGATIVE mg/dL   Protein, ur NEGATIVE NEGATIVE mg/dL   Nitrite NEGATIVE NEGATIVE   Leukocytes,Ua NEGATIVE NEGATIVE   RBC / HPF 0-5 0 - 5 RBC/hpf   Bacteria, UA NONE SEEN NONE SEEN   Squamous Epithelial / LPF 0-5 0 - 5   Mucus PRESENT   Resp panel by RT-PCR (RSV, Flu A&B, Covid) Nasopharyngeal Swab     Status: Abnormal   Collection Time: 08/16/20 10:56 AM   Specimen: Nasopharyngeal Swab; Nasopharyngeal(NP) swabs in vial transport medium  Result Value Ref Range   SARS Coronavirus 2 by RT PCR POSITIVE (A) NEGATIVE   Influenza A by PCR NEGATIVE NEGATIVE   Influenza B by PCR NEGATIVE NEGATIVE   Resp Syncytial Virus by PCR NEGATIVE NEGATIVE    Imaging:  DG CHEST PORT 1 VIEW  Result Date: 08/16/2020 CLINICAL DATA:  Shortness of breath EXAM: PORTABLE CHEST 1 VIEW COMPARISON:  July 15, 2018 FINDINGS: Lungs are clear. Heart size and pulmonary vascularity are normal. No adenopathy. No bone lesions. IMPRESSION: Lungs clear.  Heart size normal. Electronically Signed   By: Bretta Bang III M.D.   On: 08/16/2020 11:48    MAU Course: Orders Placed This Encounter  Procedures  . Resp panel by RT-PCR (RSV, Flu A&B, Covid) Nasopharyngeal Swab  . DG CHEST PORT 1 VIEW  . CBC  . Comprehensive metabolic panel  .  C-reactive protein  . D-dimer, quantitative (not at [facility])  . hCG, quantitative, pregnancy  . Urinalysis, Routine w reflex microscopic Urine, Clean Catch  . Ambulatory referral to Endocrinology  . Airborne precautions  . ED EKG  . Discharge patient   Meds ordered this encounter  Medications  . acetaminophen (TYLENOL) tablet 1,000 mg  . cyclobenzaprine (FLEXERIL) tablet 10 mg  . AND Linked Order Group   . alum & mag hydroxide-simeth (MAALOX/MYLANTA) 200-200-20 MG/5ML suspension 30 mL   . lidocaine (XYLOCAINE) 2 % viscous mouth solution 15 mL  . dicyclomine (BENTYL) capsule 10 mg  . escitalopram (LEXAPRO) 10 MG tablet    Sig: Take 1 tablet (10 mg total) by mouth daily.    Dispense:  90 tablet    Refill:  1    Order Specific Question:   Supervising Provider    Answer:   Reva Bores [[id number]]  . hydrOXYzine (ATARAX/VISTARIL) 25 MG tablet    Sig: Take 1 tablet (25 mg total) by mouth at bedtime.    Dispense:  30 tablet    Refill:  1    Order Specific Question:   Supervising Provider    Answer:   Reva Bores [[id number]]  . omeprazole (PRILOSEC) 20 MG capsule    Sig: Take 1 capsule (20 mg total) by mouth in the morning and at bedtime.    Dispense:  30 capsule    Refill:  3    Order Specific Question:   Supervising Provider  Answer:   Donnamae Jude [id number]  . valACYclovir (VALTREX) 500 MG tablet    Sig: Take 1 tablet (500 mg total) by mouth 2 (two) times daily.    Dispense:  60 tablet    Refill:  6    Order Specific Question:   Supervising Provider    Answer:   Donnamae Jude [id number]  . insulin NPH Human (NOVOLIN N) 100 UNIT/ML injection    Sig: Inject 0.1 mLs (10 Units total) into the skin 2 (two) times daily at 8 am and 10 pm.    Dispense:  10 mL    Refill:  3    Order Specific Question:   Supervising Provider    Answer:   Donnamae Jude [id number]  . insulin aspart (NOVOLOG) 100 UNIT/ML injection    Sig: Inject 8 Units into the skin 3 (three) times daily before meals.     Dispense:  10 mL    Refill:  12    Order Specific Question:   Supervising Provider    Answer:   Merrily Pew    MDM: After CXR, Tamara Reyes given flexeril and Tylenol for generalized chest/abdominal pain which Tamara Reyes said helped the tightness. GI cocktail given and Tamara Reyes expressed complete relief of discomfort.  Tamara Reyes stated Tamara Reyes has not been taking any of her medications (prescribed on 07/20/20) since Tamara Reyes found out Tamara Reyes was pregnant. Discussed with Dr. Nehemiah Settle who calculated her insulin needs by weight and recommends switching her from Trulicity to NPH/regular insulin. Will make referral to endocrinology and diabetic education.  During her intake, RN reviewed Tamara Reyes's history and tried to confirm HSV diagnosis. Tamara Reyes stated Tamara Reyes does not remember ever being notified that Tamara Reyes was positive for HSV back in 2016 (there is a telephone encounter stating the Tamara Reyes was informed). While counseling her about the positive status and how Tamara Reyes may have outbreaks in pregnancy, Tamara Reyes stated Tamara Reyes has an area of concern on her labia (lesion found on exam). Tamara Reyes tearful and upset. Gave general reassurance, will send home on Valtrex. Encouraged Tamara Reyes to keep appt with SW and begin taking Lexapro again.  HCG redrawn (Tamara Reyes was supposed to come in 08/13/20) and has risen appropriately.  Assessment: 1. COVID-19 affecting pregnancy in first trimester   2. Shortness of breath at rest   3. 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 Type 2 diabetes mellitus in patient 56 to 25 years of age with hemoglobin A1c goal of less than 7.5% (HCC)   5. Medication refill   6. Depression with anxiety   7. Gastroesophageal reflux disease without esophagitis     Plan: Discharge home in stable condition with return precautions Tamara Reyes to establish care at [place] for [place] [facility]. Call.   Specialty: Obstetrics and Gynecology Why: to establish new OB care Contact  information: [place] [phone]              Allergies as of 08/16/2020   No Known Allergies     Medication List    STOP taking these medications   atorvastatin 20 MG tablet Commonly known as: LIPITOR   lisinopril 2.5 MG tablet Commonly known as: ZESTRIL   metFORMIN 1000 MG tablet Commonly known as: GLUCOPHAGE   Trulicity A999333 0000000 Sopn Generic drug: Dulaglutide     TAKE these medications   escitalopram 10 MG tablet Commonly known as:  Lexapro Take 1 tablet (10 mg total) by mouth daily.   hydrOXYzine 25 MG tablet Commonly known as: ATARAX/VISTARIL Take 1 tablet (25 mg total) by mouth at bedtime.   insulin aspart 100 UNIT/ML injection Commonly known as: novoLOG Inject 8 Units into the skin 3 (three) times daily before meals.   insulin NPH Human 100 UNIT/ML injection Commonly known as: NOVOLIN N Inject 0.1 mLs (10 Units total) into the skin 2 (two) times daily at 8 am and 10 pm.   omeprazole 20 MG capsule Commonly known as: PRILOSEC Take 1 capsule (20 mg total) by mouth in the morning and at bedtime. What changed: when to take this   Pen Needles 31G X 8 MM Misc Inject 1 application into the skin at bedtime.   polyethylene glycol 17 g packet Commonly known as: MIRALAX / GLYCOLAX Take 17 g by mouth as needed for mild constipation.   senna 8.6 MG Tabs tablet Commonly known as: SENOKOT Take 1 tablet (8.6 mg total) by mouth daily as needed for mild constipation.   valACYclovir 500 MG tablet Commonly known as: VALTREX Take 1 tablet (500 mg total) by mouth 2 (two) times daily.      Gaylan Gerold, CNM, MSN, [facility] 08/16/20 5:55 PM       .Alice Rieger

## 2020-08-16 NOTE — Progress Notes (Signed)
Needs referral for diabetic education.

## 2020-08-17 ENCOUNTER — Encounter: Payer: Self-pay | Admitting: Internal Medicine

## 2020-08-24 ENCOUNTER — Other Ambulatory Visit: Payer: Self-pay

## 2020-08-24 ENCOUNTER — Ambulatory Visit: Payer: Medicaid Other | Admitting: Registered"

## 2020-08-24 ENCOUNTER — Encounter: Payer: Medicaid Other | Attending: Certified Nurse Midwife | Admitting: Registered"

## 2020-08-24 DIAGNOSIS — O24111 Pre-existing diabetes mellitus, type 2, in pregnancy, first trimester: Secondary | ICD-10-CM | POA: Insufficient documentation

## 2020-08-24 DIAGNOSIS — E119 Type 2 diabetes mellitus without complications: Secondary | ICD-10-CM | POA: Diagnosis not present

## 2020-08-24 NOTE — Progress Notes (Signed)
Virtual Visit via Video Note  I connected with Tamara Reyes on 08/24/20 at 10:15 AM EST by a video enabled telemedicine application and verified that I am speaking with the correct person using two identifiers.  Location: Patient: home Provider: [facility]   I discussed the limitations of evaluation and management by telemedicine and the availability of in person appointments. The patient expressed understanding and agreed to proceed.  History of Present Illness: Pre-existing Diabetes in pregnancy, G1P0000, [redacted]w[redacted]d. Patient has been followed by [facility] >6 yrs. Patient was seen for stomach pain and evaluated at MAU. 08/12/20 +COVD test Referral in patient's chart for endocrinology, appt scheduled for 10/18/2020    Observations/Objective: Patient reports she was told she has T1DM (not documented in her problem list), but doesn't believe that is right and states she was managed with 1x/week "insulin" (Trulicity). Pt states she is not taking anything to manage blood sugar at this time and randomly checks blood sugar using her mother's glucometer.  Patient does not want to start taking the insulin prescribed at hospital encounter 08/16/20 until she talks to her PCP.  RD explained the difference between Trulicity and insulin.  Assessment and Plan: Per Gaylan Gerold, CNM, patient's chart will be forwarded to [facility] to schedule new OB patient as this patient needs to be seen by MD.  Follow Up Instructions: Start checking blood sugar 4x/day; bring log to all appointments Follow guidelines for consistent carbohydrate diet using handouts provided.  Blood glucose monitor given: Prodigy (patient picked up meter)  Patient instructed to monitor glucose levels. Goals when using insulin: FBS: 70 - 95 mg/dl 2 hour: 80-120 mg/dl (100-120 mg/dL 2021 Standards of Care)  Patient received the following handouts:  Nutrition Diabetes and  Pregnancy  Carbohydrate Counting List  Blood glucose Log Sheet  Patient will be seen for follow-up in 2 weeks or as needed.   I discussed the assessment and treatment plan with the patient. The patient was provided an opportunity to ask questions and all were answered. The patient agreed with the plan and demonstrated an understanding of the instructions.   The patient was advised to call back or seek an in-person evaluation if the symptoms worsen or if the condition fails to improve as anticipated.  I provided 45 minutes of non-face-to-face time during this encounter.   Christella Hartigan, RD, CDCES

## 2020-08-25 ENCOUNTER — Other Ambulatory Visit: Payer: Medicaid Other

## 2020-08-25 ENCOUNTER — Other Ambulatory Visit: Payer: Self-pay

## 2020-08-25 DIAGNOSIS — Z20822 Contact with and (suspected) exposure to covid-19: Secondary | ICD-10-CM

## 2020-08-27 LAB — SARS-COV-2, NAA 2 DAY TAT

## 2020-08-27 LAB — SPECIMEN STATUS REPORT

## 2020-08-27 LAB — NOVEL CORONAVIRUS, NAA: SARS-CoV-2, NAA: NOT DETECTED

## 2020-09-07 ENCOUNTER — Ambulatory Visit (INDEPENDENT_AMBULATORY_CARE_PROVIDER_SITE_OTHER): Payer: Medicaid Other | Admitting: Primary Care

## 2020-09-07 ENCOUNTER — Ambulatory Visit (INDEPENDENT_AMBULATORY_CARE_PROVIDER_SITE_OTHER): Payer: Medicaid Other | Admitting: Licensed Clinical Social Worker

## 2020-09-07 ENCOUNTER — Other Ambulatory Visit: Payer: Self-pay

## 2020-09-07 DIAGNOSIS — F411 Generalized anxiety disorder: Secondary | ICD-10-CM | POA: Diagnosis not present

## 2020-09-14 NOTE — BH Specialist Note (Signed)
Integrated Behavioral Health Initial In-Person Visit  MRN: [id number] Name: Flannery Cavallero  Number of [place] Clinician visits:: 1/6 Session Start time: 10:20 AM  Session End time: 10:55 AM Total time: 35  minutes  Types of Service: Individual psychotherapy  Interpretor:No. Interpretor Name and Language: NA   Subjective: Tamara Reyes is a 25 y.o. female  Patient was referred by NP Edwards for depression and anxiety. Patient reports the following symptoms/concerns: Pt reports depression and anxiety symptoms triggered by psychosocial stressors Duration of problem: Ongoing; Severity of problem: mild  Objective: Mood: Pleasant and Affect: Appropriate Risk of harm to self or others: No plan to harm self or others  Life Context: Family and Social: Pt receives strong support from partner School/Work: Pt recently began a new job. She receives food stamps Self-Care: Pt denies any current substance use  Life Changes: Pt reports difficulty managing depression and anxiety symptoms triggered by stress. Pt has modified her diet and has prioritized meditation which has assisted in decreasing symptoms  Patient and/or Family's Strengths/Protective Factors: Social connections, Social and Emotional competence, Concrete supports in place (healthy food, safe environments, etc.), Sense of purpose and Physical Health (exercise, healthy diet, medication compliance, etc.)  Goals Addressed: Patient will: 1. Reduce symptoms of: anxiety and depression Pt agreed to continue with compliance of medications 2. Increase knowledge and/or ability of: self-management skills Pt agreed to continue utilizing healthy coping skills (reading, meditation, self-reflection, etc) 3. Demonstrate ability to: Increase healthy adjustment to current life circumstances and Increase adequate support systems for patient/family Pt requested an internal referral to [facility] to strengthen  support system  Progress towards Goals: Ongoing  Interventions: Interventions utilized: Solution-Focused Strategies, Supportive Counseling and Psychoeducation and/or Health Education  Standardized Assessments completed: GAD-7 and PHQ 2&9  Patient Response: Pt was engaged during session and was successful in identifying healthy strategies to assist with management of symptoms  Patient Centered Plan: Patient is on the following Treatment Plan(s):  Depression and Anxiety  Assessment: Patient currently experiencing symptoms of depression and anxiety triggered by psychosocial stressors.   Patient may benefit from continued medication management and establishing therapy to strengthen support system.  Plan: 1. Follow up with behavioral health clinician on : Contact LCSW with any additional behavioral health and/or resource needs 2. Behavioral recommendations: Utilize strategies discussed and comply with medications 3. Referral(s): [place] (LME/Outside Clinic) 4. "From scale of 1-10, how likely are you to follow plan?":   Rebekah Chesterfield, LCSW 09/14/20 2:24 PM

## 2020-09-17 ENCOUNTER — Other Ambulatory Visit: Payer: Self-pay

## 2020-09-17 ENCOUNTER — Encounter (INDEPENDENT_AMBULATORY_CARE_PROVIDER_SITE_OTHER): Payer: Self-pay | Admitting: Primary Care

## 2020-09-17 ENCOUNTER — Ambulatory Visit (INDEPENDENT_AMBULATORY_CARE_PROVIDER_SITE_OTHER): Payer: Medicaid Other | Admitting: Primary Care

## 2020-09-17 VITALS — BP 133/81 | HR 90 | Temp 97.5°F | Resp 16 | Wt 166.0 lb

## 2020-09-17 DIAGNOSIS — E119 Type 2 diabetes mellitus without complications: Secondary | ICD-10-CM

## 2020-09-17 DIAGNOSIS — F411 Generalized anxiety disorder: Secondary | ICD-10-CM | POA: Diagnosis not present

## 2020-09-17 DIAGNOSIS — Z111 Encounter for screening for respiratory tuberculosis: Secondary | ICD-10-CM | POA: Diagnosis not present

## 2020-09-17 NOTE — Progress Notes (Signed)
HgA1C- December 7 was 9.5 CBG- 112

## 2020-09-17 NOTE — Progress Notes (Signed)
Established Patient Office Visit  Subjective:  Patient ID: Tamara Reyes, female    DOB: 06/18/1996  Age: 25 y.o. MRN: [id number]  CC: No chief complaint on file.   HPI Tamara Reyes presents for a TB skin test and general visit PTSD. She is currently pregnant and first OBGYN visit is schedule next week.   Past Medical History:  Diagnosis Date  . Asthma   . Diabetes mellitus   . Fibromyalgia   . Genital herpes   . Hypercholesteremia   . Hypertension   . PCOS (polycystic ovarian syndrome)     Past Surgical History:  Procedure Laterality Date  . abscess removal     . TONSILLECTOMY    . TONSILLECTOMY AND ADENOIDECTOMY     tonsils only    Family History  Problem Relation Age of Onset  . Hypertension Mother   . Diabetes Mother   . Hypertension Maternal Grandmother   . Hypertension Maternal Grandfather   . Hypertension Paternal Grandmother   . Diabetes Paternal Grandmother     Social History   Socioeconomic History  . Marital status: Single    Spouse name: Not on file  . Number of children: Not on file  . Years of education: Not on file  . Highest education level: Not on file  Occupational History  . Not on file  Tobacco Use  . Smoking status: Former Smoker    Types: Cigarettes    Quit date: 05/28/2014    Years since quitting: 6.3  . Smokeless tobacco: Never Used  Vaping Use  . Vaping Use: Some days  Substance and Sexual Activity  . Alcohol use: No  . Drug use: Yes    Types: Marijuana    Comment: stopped once pregnant  . Sexual activity: Yes    Birth control/protection: None    Comment: had nuva ring, but it is out now  Other Topics Concern  . Not on file  Social History Narrative   Is in 12th grade at [employer] with mom   Social Determinants of Health   Financial Resource Strain: Not on file  Food Insecurity: Not on file  Transportation Needs: Not on file  Physical Activity: Not on file  Stress: Not on file  Social Connections: Not  on file  Intimate Partner Violence: Not on file    Outpatient Medications Prior to Visit  Medication Sig Dispense Refill  . escitalopram (LEXAPRO) 10 MG tablet Take 1 tablet (10 mg total) by mouth daily. 90 tablet 1  . hydrOXYzine (ATARAX/VISTARIL) 25 MG tablet Take 1 tablet (25 mg total) by mouth at bedtime. 30 tablet 1  . omeprazole (PRILOSEC) 20 MG capsule Take 1 capsule (20 mg total) by mouth in the morning and at bedtime. 30 capsule 3  . Prenatal Vit-Fe Fumarate-FA (PRENATAL MULTIVITAMIN) TABS tablet Take 1 tablet by mouth daily at 12 noon.    . valACYclovir (VALTREX) 500 MG tablet Take 1 tablet (500 mg total) by mouth 2 (two) times daily. 60 tablet 6  . insulin aspart (NOVOLOG) 100 UNIT/ML injection Inject 8 Units into the skin 3 (three) times daily before meals. (Patient not taking: Reported on 09/17/2020) 10 mL 12  . insulin NPH Human (NOVOLIN N) 100 UNIT/ML injection Inject 0.1 mLs (10 Units total) into the skin 2 (two) times daily at 8 am and 10 pm. (Patient not taking: Reported on 09/17/2020) 10 mL 3  . Insulin Pen Needle (PEN NEEDLES) 31G X 8 MM MISC Inject  1 application into the skin at bedtime. (Patient not taking: Reported on 09/17/2020) 100 each 3  . polyethylene glycol (MIRALAX / GLYCOLAX) packet Take 17 g by mouth as needed for mild constipation.  (Patient not taking: Reported on 09/17/2020)    . senna (SENOKOT) 8.6 MG TABS tablet Take 1 tablet (8.6 mg total) by mouth daily as needed for mild constipation. (Patient not taking: No sig reported) 120 tablet 0   No facility-administered medications prior to visit.    No Known Allergies  ROS Review of Systems  All other systems reviewed and are negative.     Objective:    Physical Exam Vitals reviewed.  Constitutional:      Appearance: Normal appearance.  HENT:     Head: Normocephalic.     Right Ear: External ear normal.     Left Ear: External ear normal.     Nose: Nose normal.  Eyes:     Extraocular Movements:  Extraocular movements intact.     Pupils: Pupils are equal, round, and reactive to light.  Cardiovascular:     Rate and Rhythm: Normal rate and regular rhythm.  Pulmonary:     Effort: Pulmonary effort is normal.     Breath sounds: Normal breath sounds.  Abdominal:     General: Bowel sounds are normal.     Palpations: Abdomen is soft.  Musculoskeletal:        General: Normal range of motion.     Cervical back: Normal range of motion.  Skin:    General: Skin is warm and dry.  Neurological:     Mental Status: She is alert and oriented to person, place, and time.  Psychiatric:        Mood and Affect: Mood normal.        Behavior: Behavior normal.        Thought Content: Thought content normal.        Judgment: Judgment normal.     BP 133/81   Pulse 90   Temp (!) 97.5 F (36.4 C)   Resp 16   Wt 166 lb (75.3 kg)   LMP 07/09/2020   BMI 25.24 kg/m  Wt Readings from Last 3 Encounters:  09/17/20 166 lb (75.3 kg)  08/12/20 166 lb (75.3 kg)  08/10/20 165 lb 9.6 oz (75.1 kg)     Health Maintenance Due  Topic Date Due  . COVID-19 Vaccine (1) Never done  . URINE MICROALBUMIN  10/16/2019  . FOOT EXAM  09/07/2020  . OPHTHALMOLOGY EXAM  09/11/2020    There are no preventive care reminders to display for this patient.  Lab Results  Component Value Date   TSH 0.464 03/05/2020   Lab Results  Component Value Date   WBC 5.9 08/16/2020   HGB 12.2 08/16/2020   HCT 37.7 08/16/2020   MCV 79.9 (L) 08/16/2020   PLT 214 08/16/2020   Lab Results  Component Value Date   NA 134 (L) 08/16/2020   K 4.1 08/16/2020   CO2 26 08/16/2020   GLUCOSE 232 (H) 08/16/2020   BUN 8 08/16/2020   CREATININE 0.72 08/16/2020   BILITOT 0.5 08/16/2020   ALKPHOS 38 08/16/2020   AST 16 08/16/2020   ALT 14 08/16/2020   PROT 6.9 08/16/2020   ALBUMIN 3.5 08/16/2020   CALCIUM 8.8 (L) 08/16/2020   ANIONGAP 8 08/16/2020   Lab Results  Component Value Date   CHOL 258 (H) 03/05/2020   Lab Results   Component Value Date  HDL 54 03/05/2020   Lab Results  Component Value Date   LDLCALC 186 (H) 03/05/2020   Lab Results  Component Value Date   TRIG 104 03/05/2020   Lab Results  Component Value Date   CHOLHDL 4.8 (H) 03/05/2020   Lab Results  Component Value Date   HGBA1C 9.5 (A) 07/20/2020      Assessment & Plan:  Diagnoses and all orders for this visit:  Screening examination for pulmonary tuberculosis -     PPD  GAD (generalized anxiety disorder) Followed by CSW decided she was receptive to medication . On lexapro prior to pregnancy. If GYN does not change medication to Zoloft at visit which is less harmful during pregnancy . Will change   Type 2 diabetes mellitus without complication, without long-term current use of insulin (HCC) Goal of therapy: Less than 6.5 hemoglobin A1c. Monitor foods that are high in carbohydrates are the following rice, potatoes, breads, sugars, and pastas.  Reduction in the intake (eating) will assist in lowering your blood sugars. Last A1C 9.5- uncontrolled  -     Microalbumin, urine    Follow-up: No follow-ups on file.    Kerin Perna, NP

## 2020-09-20 ENCOUNTER — Other Ambulatory Visit: Payer: Self-pay

## 2020-09-20 ENCOUNTER — Ambulatory Visit (INDEPENDENT_AMBULATORY_CARE_PROVIDER_SITE_OTHER): Payer: Medicaid Other

## 2020-09-20 DIAGNOSIS — Z111 Encounter for screening for respiratory tuberculosis: Secondary | ICD-10-CM

## 2020-09-20 LAB — TB SKIN TEST
Induration: 0 mm
TB Skin Test: NEGATIVE

## 2020-09-22 ENCOUNTER — Ambulatory Visit (INDEPENDENT_AMBULATORY_CARE_PROVIDER_SITE_OTHER): Payer: Medicaid Other | Admitting: Obstetrics and Gynecology

## 2020-09-22 ENCOUNTER — Other Ambulatory Visit: Payer: Medicaid Other

## 2020-09-22 ENCOUNTER — Other Ambulatory Visit: Payer: Self-pay

## 2020-09-22 ENCOUNTER — Encounter: Payer: Self-pay | Admitting: Obstetrics and Gynecology

## 2020-09-22 ENCOUNTER — Other Ambulatory Visit: Payer: Self-pay | Admitting: Obstetrics and Gynecology

## 2020-09-22 VITALS — BP 116/81 | HR 79 | Wt 166.6 lb

## 2020-09-22 DIAGNOSIS — O10919 Unspecified pre-existing hypertension complicating pregnancy, unspecified trimester: Secondary | ICD-10-CM

## 2020-09-22 DIAGNOSIS — O0990 Supervision of high risk pregnancy, unspecified, unspecified trimester: Secondary | ICD-10-CM

## 2020-09-22 DIAGNOSIS — O24111 Pre-existing diabetes mellitus, type 2, in pregnancy, first trimester: Secondary | ICD-10-CM

## 2020-09-22 DIAGNOSIS — O09891 Supervision of other high risk pregnancies, first trimester: Secondary | ICD-10-CM | POA: Diagnosis not present

## 2020-09-22 DIAGNOSIS — Z31430 Encounter of female for testing for genetic disease carrier status for procreative management: Secondary | ICD-10-CM | POA: Diagnosis not present

## 2020-09-22 MED ORDER — INSULIN NPH (HUMAN) (ISOPHANE) 100 UNIT/ML ~~LOC~~ SUSP: 13.0000 [IU] | Freq: Every day | SUBCUTANEOUS | 3 refills | Status: DC

## 2020-09-22 NOTE — Progress Notes (Signed)
New OB Note  09/22/2020   Clinic: [facility]   Chief Complaint: NOB  Transfer of Care Patient: no  History of Present Illness: Ms. Spranger is a 25 y.o. G1P0000 @ 10/5 weeks ([place] 9/2, based on Patient's last menstrual period was 07/09/2020.=5wk u/s).  Preg complicated by has Hyperlipidemia; Chronic hypertension during pregnancy, antepartum; Obesity; Pre-existing type 2 diabetes mellitus in pregnancy; and Supervision of high risk pregnancy, antepartum on their problem list.   Her periods were: qmonth, regular She was using no method when she conceived.  She has mild signs or symptoms of nausea/vomiting of pregnancy. She has Negative signs or symptoms of miscarriage or preterm labor On any medications around the time she conceived/early pregnancy: patient stopped taking her insulin back in October and hasn't been on any since. She was on lexapro, vistaril, valtrex  ROS: A 12-point review of systems was performed and negative, except as stated in the above HPI.  OBGYN History: As per HPI. OB History  Gravida Para Term Preterm AB Living  1 0 0 0 0 0  SAB IAB Ectopic Multiple Live Births  0 0 0 0      # Outcome Date GA Lbr Len/2nd Weight Sex Delivery Anes PTL Lv  1 Current             History of pap smears: neg 2021  Past Medical History: Past Medical History:  Diagnosis Date  . Asthma   . COVID-19 08/16/2020  . DM (diabetes mellitus), type 2 ([place])   . Fibromyalgia   . Genital herpes   . History of marijuana use 2021  . Hypercholesteremia   . Hypertension   . Nexplanon insertion 08/26/2013   Lot #[id number] Exp 02/2016   . PCOS (polycystic ovarian syndrome)     Past Surgical History: Past Surgical History:  Procedure Laterality Date  . abscess removal     . TONSILLECTOMY    . TONSILLECTOMY AND ADENOIDECTOMY     tonsils only    Family History:  Family History  Problem Relation Age of Onset  . Hypertension Mother   . Diabetes  Mother   . Hypertension Maternal Grandmother   . Hypertension Maternal Grandfather   . Hypertension Paternal Grandmother   . Diabetes Paternal Grandmother    Social History:  Social History   Socioeconomic History  . Marital status: Single    Spouse name: Not on file  . Number of children: Not on file  . Years of education: Not on file  . Highest education level: Not on file  Occupational History  . Not on file  Tobacco Use  . Smoking status: Former Smoker    Types: Cigarettes    Quit date: 05/28/2014    Years since quitting: 6.3  . Smokeless tobacco: Never Used  Vaping Use  . Vaping Use: Some days  Substance and Sexual Activity  . Alcohol use: No  . Drug use: Yes    Types: Marijuana    Comment: stopped once pregnant  . Sexual activity: Yes    Birth control/protection: None    Comment: had nuva ring, but it is out now  Other Topics Concern  . Not on file  Social History Narrative   Is in 12th grade at [employer] with mom   Social Determinants of Health   Financial Resource Strain: Not on file  Food Insecurity: Not on file  Transportation Needs: Not on file  Physical Activity: Not on file  Stress: Not on file  Social Connections: Not on file  Intimate Partner Violence: Not on file    Allergy: No Known Allergies   Current Outpatient Medications: Lexapro, vistaril prn, prenatal vitamin  Physical Exam:   BP 116/81   Pulse 79   Wt 166 lb 9.6 oz (75.6 kg)   LMP 07/09/2020   BMI 25.33 kg/m  Body mass index is 25.33 kg/m. Contractions: Not present Vag. Bleeding: None. Fundal height: not applicable FHTs: 646O  General appearance: Well nourished, well developed female in no acute distress.  Neck:  Supple, normal appearance, and no thyromegaly  Cardiovascular: S1, S2 normal, no murmur, rub or gallop, regular rate and rhythm Respiratory:  Clear to auscultation bilateral. Normal respiratory effort Abdomen: positive bowel sounds and no masses,  hernias; diffusely non tender to palpation, non distended Breasts: pt denies any breast s/s Neuro/Psych:  Normal mood and affect.  Skin:  Warm and dry.   Laboratory: none  Imaging:  Bedside u/s with SLIUP, +FM and FHR in the 160s-170s  Assessment: pt stable Plan: 1. Supervision of high risk pregnancy, antepartum Start low dose ASA after 12 weeks Patient declines covid vaccine recommendation - Ambulatory referral to [place] - CBC/D/Plt+RPR+Rh+ABO+Rub Ab... - CHL AMB BABYSCRIPTS OPT IN - Culture, OB Urine - Genetic Screening - Protein / creatinine ratio, urine - TSH - [place] MFM OB DETAIL +14 WK; Future - Hemoglobin A1c - [place] MFM Fetal Nuchal Translucency; Future - Comprehensive metabolic panel - Ambulatory referral to Ophthalmology  2. Pre-existing type 2 diabetes mellitus during pregnancy in first trimester AM fastings in the 100s-130s and 2h post prandials in the 150s-180s. I d/w her that tighter CBG control is needed for pregnancy and rationale for this for maternal/fetal health. Pt amenable to starting NPH 13U with breakfast.  Fetal echo to be scheduled A1c ordered NT u/s ordered given suboptimal glycemic control Recommend afp at 15 weeks Optho visit to be scheduled - Referral to Nutrition and Diabetes Services  3. Chronic HTN Doing well on no meds. See above  Problem list reviewed and updated.  Follow up in 1 weeks to check CBG  >50% of 40 min visit spent on counseling and coordination of care.     Durene Romans MD Attending [facility])

## 2020-09-22 NOTE — Patient Instructions (Signed)
AREA PEDIATRIC/FAMILY PRACTICE PHYSICIANS  [place] ([address]) . [facility] o Chambliss, MD; Eniola, MD; Hale, MD; Hensel, MD; McDiarmid, MD; McIntyer, MD; Oden Lindaman, MD; Walden, MD o [address] [place], [place] [address] o [phone] o Mon-Fri 8:30-12:30, 1:30-5:00 o Providers come to see babies at [facility] o Accepting Medicaid . [facility] o Limited providers who accept newborns: Koirala, MD; Morrow, MD; Wolters, MD o [address], [place], [place] [address] o [phone] o Mon-Fri 8:00-5:30 o Babies seen by providers at [facility] o Does NOT accept Medicaid o Please call early in hospitalization for appointment (limited availability)  . [facility] o Mulberry, MD o [address] [place], [place] [address] o [phone] o Mon, Tue, Thur, Fri 8:30-5:00, Wed 10:00-7:00 (closed 1-2pm) o Babies seen by [facility] providers o Accepting Medicaid . [facility] - Pediatrician o Rubin, MD o [address], [place], [place] [address] o [phone] o Mon-Fri 8:30-5:00, Sat 8:30-12:00 o Provider comes to see babies at [facility] o Accepting Medicaid o Must have been referred from current patients or contacted office prior to delivery . [facility] ([facility]) o Brown, MD; Chandler, MD; Ettefagh, MD; Grant, MD; Lester, MD; McCormick, MD; McQueen, MD; Prose, MD; Simha, MD; Stanley, MD; Stryffeler, NP; Tebben, NP o [address], [place], [place] [address] o [phone] o Mon, Tue, Thur, Fri 8:30-5:30, Wed 9:30-5:30, Sat 8:30-12:30 o Babies seen by [facility] providers o Accepting Medicaid o Only accepting infants of first-time parents or siblings of current patients o Hospital discharge coordinator will make follow-up appointment . Jack Amos o [address],  [place], [place]  [address] o [phone]   Fax - [phone] . [facility] o [address], [place], [place]  [address] o Phone - [phone]   Fax - [phone] . Shilpa Gosrani o [address], [place], [place]  [address] o [phone]  [place] ([address]) . [facility] o Bates, MD; Brassfield, MD; Cooper, Cox, MD; MD; Davis, MD; Dovico, MD; Ettefaugh, MD; Little, MD; Lowe, MD; Keiffer, MD; Melvin, MD; Sumner, MD; Williams, MD o [address], [place], [place] [address] o [phone] o Mon-Fri 8:30-5:00 (extended evenings Mon-Thur as needed), Sat-Sun 10:00-1:00 o Providers come to see babies at [facility] o Accepting Medicaid for families of first-time babies and families with all children in the household age 3 and under. Must register with office prior to making appointment (M-F only). . [facility] o Henson, NP; Knapp, MD; Lalonde, MD; Tysinger, PA o [address] [place], [place] [address] o [phone] o Mon-Fri 8:00-5:00 o Babies seen by providers at [facility] o Does NOT accept Medicaid/Commercial Insurance Only . [facility] ([facility])  o Artis, MD; Barnes, MD; Bratton, MD; Coccaro, MD; Lockett Gardner, MD; Kramer, MD; Marshall, MD; Netherton, MD; Poleto, MD; Skinner, MD o [address] [place], [place] [address] o [phone] o Mon-Fri 8:30-5:30, Sat (Oct.-Mar.) 9:00-1:00 o Babies seen by providers at [facility] o Accepting Medicaid  [place] ([address]) . [facility] o Reid, MD; Warner, MD o [address], , [place] [address] o [phone] o Mon-Fri 8:30-5:00, Sat 8:30-12:00 o Providers come to see babies at [facility] o Does NOT accept Medicaid . [facility] o Becker, PA; Hagler, MD; Scifres, PA; Sun, MD; Swayne, MD o [address],  [place], [place] [address] o [phone] o Mon-Fri 8:00-5:00 o Babies seen by providers at [facility] o Does NOT accept Medicaid o Only accepting babies of parents who are patients o Please call early in hospitalization for appointment (limited availability) . [facility] o Clark, MD; Frye, MD; Kelleher, MD; Mack, NP; Miller, MD; O'Keller, MD; Patterson, NP; Pudlo, MD; Puzio, MD; Thomas, MD; Tucker, MD; Twiselton, MD o [address], [place], [place] [address] o [phone] o Mon-Fri 8:00-5:00, Sat 9:00-12:00 o Providers come to see babies at [facility] o Does NOT accept Medicaid  [place] ([address]) . [facility] o Limited providers accepting new patients: Brake, NP; Wharton, PA o [address], [place], [place] [address] o [phone] o Mon-Fri 8:00-5:00 o Babies seen by providers at [facility] o Does NOT accept Medicaid o Only accepting babies of parents who are patients o Please call early in hospitalization for appointment (limited availability) . [facility] o Gay, MD; Quinlan, MD o [address] [place], [place] [address] o [phone] (press 1 to schedule appointment) o Mon-Fri 8:00-5:00 o Providers come to see babies at [facility] o Does NOT accept Medicaid . [facility] o Mazer, MD o [address] [place], [place] [address] o [phone] o Mon-Fri 8:30-5:00 (lunch 12:30-1:00), extended hours by appointment only Wed 5:00-6:30 o Babies seen by [facility] providers o Accepting Medicaid . [facility] o Banks, MD; Jordan, MD; Koberlein, MD o [address], [place], [place] [address] o [phone] o Mon-Fri 8:00-5:00 o Babies seen by [facility] providers o Does NOT accept Medicaid . [facility] o Parker, MD; Hunter, MD; Wallace, DO o [address] [place], [place]  [address] o [phone] o Mon-Fri 8:00-5:00 o Babies seen by [facility] providers o Does NOT accept Medicaid . [facility] o Brandon, PA; Brecken, PA; Christy, NP; Dees, MD; DeClaire, MD; DeWeese, MD; Hansen, NP; Mills, NP; Parrish, NP; Smoot, NP; Summer, MD; Vapne, MD o [address] [place], [place] [address] o [phone] o Mon-Fri 8:30-5:00, Sat 10:00-1:00 o Providers come to see babies at [facility] o Does NOT accept Medicaid o Free prenatal information session Tuesdays at 4:45pm . [facility] o Bouska, MD; Gordon, PA; Jeffery, PA; Weber, PA o [address] [place] [address] o [phone] o Mon-Fri 7:30-5:30 o Babies seen by [facility] providers . [facility] o [address], [place], [place]  [address] o [phone]   Fax - [phone]  [place] ([address] & [address]) . [facility] o [place], [place] o [address] [place], [place] [address] o [phone] o Mon-Thur 8:00-6:00 o Providers come to see babies at [facility] o Accepting Medicaid . [facility] o Anderson, NP; Badger, MD; Beal, PA; Spencer, PA o [address] [place], [place] [address] o [phone] o Mon-Thur 7:30-7:30, Fri 7:30-4:30 o Babies seen by [facility] providers o Accepting Medicaid . [facility] o Agbuya, MD; Klett, NP; Romgoolam, MD o [address], [place], [place] [address] o [phone] o Mon-Fri 8:30-5:00, Sat 8:30-12:00 o Providers come to see babies at [facility] o Accepting Medicaid o Must have "Meet & Greet" appointment at office prior to delivery . [facility] ([facility] o McCord,   MD; Wallace, MD; Wood, MD o [address], [place], [place] [address] o [phone] o Mon-Wed 8:00-6:00, Thur-Fri 8:00-5:00, Sat 9:00-12:00 o Providers come to  see babies at [facility] o Does NOT accept Medicaid o Only accepting siblings of current patients . [facility]  o [address], [place], [place]  [address] o [phone]   Fax - [phone] . [facility] o [address], [place], [place]  [address] o [phone]   Fax - [phone]  [place] ([address] & [address]) . [facility] o Cirigliano, DO; Matthews, DO o [address] [place], [place] [address] o [phone] o Mon-Fri 7:00-5:00 o Babies seen by [facility] providers o Does NOT accept Medicaid . [facility] o Briscoe, MD; Howley, PA; Moreira, PA o [address], [place], [place] [address] o [phone] o Mon-Fri 8:00-5:00 o Babies seen by [facility] providers o Accepting Medicaid . [facility] o Boyd, MD; Church, PA; Jones, NP; Osborn, PA o [address], [place], [place] [address] o [phone] o Mon-Fri 8:00-5:00 o Babies seen by providers at [facility] o Accepting Medicaid  [place] ([address]) . [facility] [place] o Wendling, DO o [address] [place], [place] [address] o [phone] o Mon-Fri 8:00-5:00 o Babies seen by [facility] providers o Does NOT accept Medicaid o Limited availability, please call early in hospitalization to schedule follow-up . [facility] o Calderon, PA; Cummings, MD; Dillard, MD; Martin, PA; Olson, MD; VanDeven, PA o [address], [place], [place] [address] o [phone] o Mon-Fri 8:30-5:00, Sat 9:00-12:00 o Babies seen by providers at [facility] o Accepting Medicaid o Please register online then schedule online or call office o [email or web address] . [facility] ([facility] o Hunter, NP; Kumar, MD; Martin Rogers, PA o [address], [place], [place] [address] o [phone] o Mon-Fri 8:00-5:00 o Babies seen by providers at [facility] o Accepting Medicaid . [facility] ([facility] o Cane Beds, MD; Kristi Fleenor, NP; West, MD o [address], [place], [place] [address] o [phone] o Mon-Fri 8:00-5:30, Sat&Sun by appointment (phones open at 8:30) o Babies seen by [facility] providers o Accepting Medicaid o Must be a first-time baby or sibling of current patient . [facility] [place]  o [address], [place], [place]  [address] o [phone]   Fax - [phone]  [place] ([address]) . [facility] o Brown, PA; Cowen, PA; Rice, MD; Helton, PA; Spry, MD o [address] [place], [place] [address] o [phone] o Mon-Thur 8:00-7:00, Fri 8:00-5:00, Sat 8:00-12:00, Sun 9:00-12:00 o Babies seen by [facility] providers o Accepting Medicaid . [facility] o Coe-Goins, MD; Marshall, MD; Pierre-Louis, MD o [address] [place] [address], [place], [place] [address] o [phone] o Mon-Thur 8:00-5:00 o Babies seen by providers at [facility] o Accepting Medicaid . [facility] o Bratton, MD; Coe-Goins, MD; Hayes, MD; Lewis, MD; List, MD; Lott, MD; Marshall, MD; Moran, MD; O'Ondra Deboard, MD; Pierre-Louis, MD; Pitonzo, MD; Scholer, MD; Spangle, MD o [address] [place],    [address] o [phone] o Mon-Fri 8:00-5:30, Sat (Oct.-Mar.) 9:00-1:00 o Babies seen by providers at [facility] o Accepting Medicaid o Must fill out new patient packet, available online at [email or web address] . [facility] ([facility] o Friddle, NP; Harris, NP; Kelly, NP; Logan, MD;  Melvin, PA; Poth, MD; Ramadoss, MD; Stanton, NP o [address], [place], [place] [address] o [phone] o Mon-Thur 8:00-5:30, Fri 8:00-5:00 o Babies seen by providers at [facility] o Accepting Medicaid  [place] ([address]) . [facility] o Dixon, PA; [place], MD; Pickard, MD; Tapia, PA o [address], [place], [place] [address] o [phone] o Mon-Fri 8:00-5:00 o Babies seen by providers at [facility] o Accepting Medicaid   [place] ([address]) . [facility] [place] o Masneri, DO; Meyers, MD; Nelson, PA o [address], [place], [place] [address] o [phone] o Mon-Fri 8:00-5:00 o Babies seen by providers at [facility] o Does NOT accept Medicaid o Limited appointment availability, please call early in hospitalization  . [facility] [place] o Kunedd, DO; McGowen, MD o [address], [place], [place] [address] o [phone] o Mon-Fri 8:00-5:00 o Babies seen by [facility] providers o Does NOT accept Medicaid . [facility] [place] o Cameron, MD; MacDonald, MD; Michaels, PA; Nayak, MD o [address] [place] [address], [place], [place] [address] o [phone] o Mon-Fri 8:00-5:00 o After hours clinic ([address] [place], [place] [address]) [phone] Mon-Fri 5:00-8:00, Sat 12:00-6:00, Sun 10:00-4:00 o Babies seen by [facility] providers o Accepting Medicaid . [facility] [place] o [address], [place], [place]  [address] o [phone]   Fax - [phone]  [place] ([address]) . [facility] [place] [facility] o Andy, MD o [address], [place], [place] [address] o [phone] o Mon-Fri 8:00-5:00 o Babies seen by [facility] providers o Does NOT accept Medicaid . [facility] [place] ([facility] [place]) o Eksir, MD o [address], [place], [place]  [address] o [phone] o Mon-Thur 8:00-7:00, Fri 8:00-5:00, Sat 8:00-12:00 o Babies seen by providers at [facility] o Accepting Medicaid - but does not have vaccinations in office (must be received elsewhere) o Limited availability, please call early in hospitalization  [place] ([address]) . [facility]  o Charlene Flemming, MD o [address],  [place] [address] o [phone]  Fax [phone]   

## 2020-09-23 ENCOUNTER — Encounter: Payer: Self-pay | Admitting: Obstetrics and Gynecology

## 2020-09-23 ENCOUNTER — Telehealth: Payer: Self-pay

## 2020-09-23 DIAGNOSIS — R7989 Other specified abnormal findings of blood chemistry: Secondary | ICD-10-CM | POA: Insufficient documentation

## 2020-09-23 LAB — CBC/D/PLT+RPR+RH+ABO+RUB AB...
Antibody Screen: NEGATIVE
Basophils Absolute: 0 10*3/uL (ref 0.0–0.2)
Basos: 0 %
EOS (ABSOLUTE): 0 10*3/uL (ref 0.0–0.4)
Eos: 0 %
HCV Ab: <0.1 s/co ratio (ref 0.0–0.9)
HIV Screen 4th Generation wRfx: NONREACTIVE
Hematocrit: 36.2 % (ref 34.0–46.6)
Hemoglobin: 11.8 g/dL (ref 11.1–15.9)
Hepatitis B Surface Ag: NEGATIVE
Immature Grans (Abs): 0 10*3/uL (ref 0.0–0.1)
Immature Granulocytes: 0 %
Lymphocytes Absolute: 2.5 10*3/uL (ref 0.7–3.1)
Lymphs: 25 %
MCH: 25.9 pg — ABNORMAL LOW (ref 26.6–33.0)
MCHC: 32.6 g/dL (ref 31.5–35.7)
MCV: 79 fL (ref 79–97)
Monocytes Absolute: 0.8 10*3/uL (ref 0.1–0.9)
Monocytes: 8 %
Neutrophils Absolute: 6.6 10*3/uL (ref 1.4–7.0)
Neutrophils: 67 %
Platelets: 255 10*3/uL (ref 150–450)
RBC: 4.56 x10E6/uL (ref 3.77–5.28)
RDW: 13.4 % (ref 11.7–15.4)
RPR Ser Ql: NONREACTIVE
Rh Factor: POSITIVE
Rubella Antibodies, IGG: 5.15 index (ref >0.99)
WBC: 9.9 10*3/uL (ref 3.4–10.8)

## 2020-09-23 LAB — COMPREHENSIVE METABOLIC PANEL
ALT: 6 IU/L (ref 0–32)
AST: 9 IU/L (ref 0–40)
Albumin/Globulin Ratio: 1.6 (ref 1.2–2.2)
Albumin: 4 g/dL (ref 3.9–5.0)
Alkaline Phosphatase: 40 IU/L — ABNORMAL LOW (ref 44–121)
BUN/Creatinine Ratio: 11 (ref 9–23)
BUN: 6 mg/dL (ref 6–20)
Bilirubin Total: 0.3 mg/dL (ref 0.0–1.2)
CO2: 22 mmol/L (ref 20–29)
Calcium: 9.5 mg/dL (ref 8.7–10.2)
Chloride: 101 mmol/L (ref 96–106)
Creatinine, Ser: 0.53 mg/dL — ABNORMAL LOW (ref 0.57–1.00)
GFR calc Af Amer: 154 mL/min/{1.73_m2} (ref >59)
GFR calc non Af Amer: 133 mL/min/{1.73_m2} (ref >59)
Globulin, Total: 2.5 g/dL (ref 1.5–4.5)
Glucose: 143 mg/dL — ABNORMAL HIGH (ref 65–99)
Potassium: 4.1 mmol/L (ref 3.5–5.2)
Sodium: 140 mmol/L (ref 134–144)
Total Protein: 6.5 g/dL (ref 6.0–8.5)

## 2020-09-23 LAB — PROTEIN / CREATININE RATIO, URINE
Creatinine, Urine: 95.8 mg/dL
Protein, Ur: 5.2 mg/dL
Protein/Creat Ratio: 54 mg/g creat (ref 0–200)

## 2020-09-23 LAB — HEMOGLOBIN A1C
Est. average glucose Bld gHb Est-mCnc: 197 mg/dL
Hgb A1c MFr Bld: 8.5 % — ABNORMAL HIGH (ref 4.8–5.6)

## 2020-09-23 LAB — HCV INTERPRETATION

## 2020-09-23 LAB — TSH: TSH: 0.089 u[IU]/mL — ABNORMAL LOW (ref 0.450–4.500)

## 2020-09-23 MED FILL — HumuLIN N 100 UNIT/ML SUSP: 100 | 28 days supply | Qty: 10 | Fill #0

## 2020-09-23 NOTE — Telephone Encounter (Signed)
Called Pt to advise of her Fetal echo appt on 12/01/20 @ 9:30a and her [facility] appointment on 10/19/20 at 1:45p. Pt verbalized understanding.

## 2020-09-24 LAB — CULTURE, OB URINE

## 2020-09-24 LAB — URINE CULTURE, OB REFLEX

## 2020-09-29 ENCOUNTER — Other Ambulatory Visit: Payer: Self-pay | Admitting: Obstetrics & Gynecology

## 2020-09-29 ENCOUNTER — Telehealth (INDEPENDENT_AMBULATORY_CARE_PROVIDER_SITE_OTHER): Payer: Medicaid Other | Admitting: Obstetrics & Gynecology

## 2020-09-29 DIAGNOSIS — O24111 Pre-existing diabetes mellitus, type 2, in pregnancy, first trimester: Secondary | ICD-10-CM

## 2020-09-29 DIAGNOSIS — O10919 Unspecified pre-existing hypertension complicating pregnancy, unspecified trimester: Secondary | ICD-10-CM

## 2020-09-29 DIAGNOSIS — O10911 Unspecified pre-existing hypertension complicating pregnancy, first trimester: Secondary | ICD-10-CM

## 2020-09-29 DIAGNOSIS — R7989 Other specified abnormal findings of blood chemistry: Secondary | ICD-10-CM

## 2020-09-29 DIAGNOSIS — O99891 Other specified diseases and conditions complicating pregnancy: Secondary | ICD-10-CM

## 2020-09-29 DIAGNOSIS — Z3A11 11 weeks gestation of pregnancy: Secondary | ICD-10-CM

## 2020-09-29 DIAGNOSIS — O0990 Supervision of high risk pregnancy, unspecified, unspecified trimester: Secondary | ICD-10-CM

## 2020-09-29 DIAGNOSIS — E119 Type 2 diabetes mellitus without complications: Secondary | ICD-10-CM

## 2020-09-29 MED ORDER — VALACYCLOVIR HCL 500 MG PO TABS: 500.0000 mg | ORAL_TABLET | Freq: Every day | ORAL | 12 refills | Status: DC

## 2020-09-29 MED ORDER — SERTRALINE HCL 50 MG PO TABS: 50.0000 mg | ORAL_TABLET | Freq: Every day | ORAL | 5 refills | Status: DC

## 2020-09-29 MED ORDER — VALACYCLOVIR HCL 500 MG PO TABS: 500.0000 mg | ORAL_TABLET | Freq: Two times a day (BID) | ORAL | 0 refills | Status: DC

## 2020-09-29 MED FILL — VALACYCLOVIR HCL 500 MG TAB: 500 | 5 days supply | Qty: 10 | Fill #0

## 2020-09-29 MED FILL — SERTRALINE HCL 50 MG TABLET: 50 | 30 days supply | Qty: 30 | Fill #0

## 2020-09-29 NOTE — Progress Notes (Signed)
Pt has Paper Glucose logs, will take BP later & record in My Chart, Pt currently at work.Pt also concerned how HSV may effect pregnancy & baby. States her PCP was suppose to start her on Zofran instead of taking Lexapro.  I connected with Janari Gagner 09/29/20 at  2:30 PM EST by: MyChart video and verified that I am speaking with the correct person using two identifiers.  Patient is located at work and provider is located at [employer] [facility].     The purpose of this virtual visit is to provide medical care while limiting exposure to the novel coronavirus. I discussed the limitations, risks, security and privacy concerns of performing an evaluation and management service by MyChart video and the availability of in person appointments. I also discussed with the patient that there may be a patient responsible charge related to this service. By engaging in this virtual visit, you consent to the provision of healthcare.  Additionally, you authorize for your insurance to be billed for the services provided during this visit.  The patient expressed understanding and agreed to proceed.  The following staff members participated in the virtual visit:  Ubaldo Glassing    PRENATAL VISIT NOTE  Subjective:  Tamara Reyes is a 25 y.o. G1P0000 at [redacted]w[redacted]d  for phone visit for ongoing prenatal care.  She is currently monitored for the following issues for this high-risk pregnancy and has Hyperlipidemia; Chronic hypertension during pregnancy, antepartum; Obesity; Pre-existing type 2 diabetes mellitus in pregnancy; Supervision of high risk pregnancy, antepartum; and Low TSH level on their problem list.  Patient reports vaginal irritation.  Contractions: Not present. Vag. Bleeding: None.  Movement: Absent. Denies leaking of fluid.   The following portions of the patient's history were reviewed and updated as appropriate: allergies, current medications, past family history, past medical history, past social history, past  surgical history and problem list.   Objective:  There were no vitals filed for this visit. Self-Obtained  Fetal Status:     Movement: Absent      Fastings:  85-95 2 hrs after breakfast:  120-145 2 hrs after lunch: 120s 2 hrs after dinner 160 highest  Assessment and Plan:  Pregnancy: G1P0000 at [redacted]w[redacted]d 1.  Type 2 DM-pt has not picked up insulin and will today.  Pt needs visit in a week to assess CBGs.  She will also need her NIPT drawn at that time.   2.  CHTN:  Pt not taking CP (doesn't have cuff).  She will pick up Rx for cuff at her next visit. Continue asa 81 mg 3.  Has endocrine visit for low TSH.  Preterm labor symptoms and general obstetric precautions including but not limited to vaginal bleeding, contractions, leaking of fluid and fetal movement were reviewed in detail with the patient.  No follow-ups on file.  Future Appointments  Date Time Provider [place]  10/07/2020 11:15 AM [facility]  10/11/2020 11:15 AM [facility] NURSE [facility]  10/11/2020 11:30 AM [facility] US3 [facility]  10/11/2020 12:30 PM [facility] LAB [facility]  10/18/2020  1:00 PM Shamleffer, Melanie Crazier, MD [facility] None   Time spent on virtual visit: 20 minutes  Silas [place], MD

## 2020-09-29 NOTE — Progress Notes (Signed)
I connected with  Faythe Dingwall on 09/29/20 at  2:30 PM EST by telephone and verified that I am speaking with the correct person using two identifiers.   I discussed the limitations, risks, security and privacy concerns of performing an evaluation and management service by telephone and the availability of in person appointments. I also discussed with the patient that there may be a patient responsible charge related to this service. The patient expressed understanding and agreed to proceed.  Bethanne Ginger, CMA 09/29/2020  2:48 PM

## 2020-09-30 ENCOUNTER — Encounter: Payer: Self-pay | Admitting: Obstetrics & Gynecology

## 2020-09-30 MED FILL — HumuLIN N 100 UNIT/ML SUSP: 100 | 28 days supply | Qty: 10 | Fill #0

## 2020-09-30 MED FILL — hydrOXYzine HCL 25 MG TABS: 25 | 30 days supply | Qty: 30 | Fill #0

## 2020-09-30 MED FILL — ESCITALOPRAM 10 MG TABLET: 10 | 30 days supply | Qty: 30 | Fill #0

## 2020-10-01 ENCOUNTER — Other Ambulatory Visit: Payer: Self-pay | Admitting: Obstetrics and Gynecology

## 2020-10-06 ENCOUNTER — Other Ambulatory Visit: Payer: Medicaid Other

## 2020-10-07 ENCOUNTER — Ambulatory Visit: Payer: Medicaid Other | Admitting: Registered"

## 2020-10-07 ENCOUNTER — Other Ambulatory Visit: Payer: Self-pay

## 2020-10-07 ENCOUNTER — Encounter: Payer: Medicaid Other | Attending: Certified Nurse Midwife | Admitting: Registered"

## 2020-10-07 DIAGNOSIS — E119 Type 2 diabetes mellitus without complications: Secondary | ICD-10-CM | POA: Insufficient documentation

## 2020-10-07 DIAGNOSIS — O24119 Pre-existing diabetes mellitus, type 2, in pregnancy, unspecified trimester: Secondary | ICD-10-CM

## 2020-10-07 NOTE — Progress Notes (Signed)
Patient was seen on 10/07/20 for follow-up assessment and education for Type 2 Diabetes in pregnancy EDD 04/15/21; [redacted]w[redacted]d.   Pt reports she started Novolin N last week but forgot to take Novlin with breakfast this morning, hard for her to remember.   Patient is testing blood glucose as directed pre breakfast and 2 hours after each meal. But did not bring log sheet with her. Patient states that her numbers are 82-105; 110 pre-meals; 119 mg/dL after dinner.   Patient states she is not eating that much due to continued nausea and vomiting. Pt reports when she eats she is making good food choices but getting tired of eating the same thing. Patient requested simple ideas to season her vegetables and other food. Pt states the baby doesn't like the same foods she typically eats. Pt reports drinking occasional ginger ale or sprite due to nausea.  Patient reports being tired and sore. Patient states she will be seeing an endocrinologist March 7 to have her thyroid function evaluated.  The following learning objectives reviewed during follow-up visit:   CGM potential with Medicaid, especially since she has insulin prescribed.  Action of Novolin N insulin  Seasoning ideas to help improve taste of food  Plan:  Marland Kitchen Download Dexcom G6 app and Clarity app (pt verified she has compatible phone) . Return in 2 weeks to start CGM   Patient instructed to monitor glucose levels: FBS: 60 - 95 mg/dl 2 hour: <120 mg/dl  Patient received the following handouts:  Low sodium seasoning  Patient will be seen for follow-up in 2 weeks or as needed.

## 2020-10-11 ENCOUNTER — Ambulatory Visit: Payer: Medicaid Other | Attending: Obstetrics and Gynecology

## 2020-10-11 ENCOUNTER — Other Ambulatory Visit: Payer: Self-pay | Admitting: Obstetrics

## 2020-10-11 ENCOUNTER — Ambulatory Visit: Payer: Medicaid Other

## 2020-10-11 ENCOUNTER — Other Ambulatory Visit: Payer: Self-pay | Admitting: Lactation Services

## 2020-10-11 ENCOUNTER — Encounter: Payer: Self-pay | Admitting: *Deleted

## 2020-10-11 ENCOUNTER — Other Ambulatory Visit: Payer: Self-pay

## 2020-10-11 ENCOUNTER — Ambulatory Visit: Payer: Medicaid Other | Admitting: *Deleted

## 2020-10-11 VITALS — BP 131/82 | HR 93 | Wt 167.0 lb

## 2020-10-11 DIAGNOSIS — O10911 Unspecified pre-existing hypertension complicating pregnancy, first trimester: Secondary | ICD-10-CM | POA: Insufficient documentation

## 2020-10-11 DIAGNOSIS — O24112 Pre-existing diabetes mellitus, type 2, in pregnancy, second trimester: Secondary | ICD-10-CM

## 2020-10-11 DIAGNOSIS — D259 Leiomyoma of uterus, unspecified: Secondary | ICD-10-CM

## 2020-10-11 DIAGNOSIS — Z3A19 19 weeks gestation of pregnancy: Secondary | ICD-10-CM

## 2020-10-11 DIAGNOSIS — Z3689 Encounter for other specified antenatal screening: Secondary | ICD-10-CM

## 2020-10-11 DIAGNOSIS — O0990 Supervision of high risk pregnancy, unspecified, unspecified trimester: Secondary | ICD-10-CM

## 2020-10-11 DIAGNOSIS — O3412 Maternal care for benign tumor of corpus uteri, second trimester: Secondary | ICD-10-CM

## 2020-10-11 DIAGNOSIS — O10019 Pre-existing essential hypertension complicating pregnancy, unspecified trimester: Secondary | ICD-10-CM

## 2020-10-11 DIAGNOSIS — O99213 Obesity complicating pregnancy, third trimester: Secondary | ICD-10-CM

## 2020-10-11 MED ORDER — DEXCOM G6 TRANSMITTER MISC: 1.0000 | 1 refills | Status: DC

## 2020-10-11 MED ORDER — DEXCOM G6 SENSOR MISC: 1.0000 | 6 refills | Status: DC | PRN

## 2020-10-12 ENCOUNTER — Telehealth: Payer: Self-pay | Admitting: Obstetrics and Gynecology

## 2020-10-12 ENCOUNTER — Encounter: Payer: Self-pay | Admitting: *Deleted

## 2020-10-12 MED FILL — SERTRALINE HCL 50 MG TABLET: 50 | 30 days supply | Qty: 30 | Fill #0

## 2020-10-12 MED FILL — hydrOXYzine HCL 25 MG TABS: 25 | 30 days supply | Qty: 30 | Fill #0

## 2020-10-12 MED FILL — HumuLIN N 100 UNIT/ML SUSP: 100 | 28 days supply | Qty: 10 | Fill #0

## 2020-10-12 NOTE — Telephone Encounter (Signed)
I spoke with Ms. Koerber to review the results of the Panorama aneuploidy screening ordered by her OB.  Additional screening for carrier status with the Horizon panel is still pending.  Results of the Panorama NIPS through the laboratory [facility] was low-risk for fetal aneuploidies. We reviewed that these results showed a less than 1 in 10,000 risk for trisomies 21, 18 and 13, and monosomy X (Turner syndrome).  In addition, the risk for triploidy and sex chromosome trisomies (47,XXX and 47,XXY) was also low. Ms. Fritts elected to have cfDNA analysis for 22q11.2 deletion syndrome, which was also low risk (1 in 9000). We reviewed that while this testing identifies 94-99% of pregnancies with trisomy 36, trisomy 69, trisomy 41, sex chromosome aneuploidies, and triploidy, it is NOT diagnostic. A positive test result requires confirmation by CVS or amniocentesis, and a negative test result does not rule out a fetal chromosome abnormality. She also understands that this testing does not identify all genetic conditions.  The patient asked that I not disclose the gender to her at this time, but that her aunt, Lynita Lombard call for gender later today.  I will provide this information as requested.  If screening for open neural tube defects is desired, the patient could consider MSAFP screening in the second trimester.  Wilburt Finlay, MS, CGC

## 2020-10-13 ENCOUNTER — Other Ambulatory Visit: Payer: Self-pay | Admitting: Obstetrics and Gynecology

## 2020-10-18 ENCOUNTER — Ambulatory Visit (INDEPENDENT_AMBULATORY_CARE_PROVIDER_SITE_OTHER): Payer: Medicaid Other | Admitting: Internal Medicine

## 2020-10-18 ENCOUNTER — Other Ambulatory Visit: Payer: Self-pay

## 2020-10-18 VITALS — BP 140/82 | HR 90 | Ht 68.0 in | Wt 168.4 lb

## 2020-10-18 DIAGNOSIS — O24119 Pre-existing diabetes mellitus, type 2, in pregnancy, unspecified trimester: Secondary | ICD-10-CM

## 2020-10-18 DIAGNOSIS — E0590 Thyrotoxicosis, unspecified without thyrotoxic crisis or storm: Secondary | ICD-10-CM | POA: Insufficient documentation

## 2020-10-18 LAB — POCT GLUCOSE (DEVICE FOR HOME USE): POC Glucose: 156 mg/dl — AB (ref 70–99)

## 2020-10-18 NOTE — Patient Instructions (Addendum)
TIMING  BLOOD SUGAR GOALS  FASTING (before breakfast)  60 - 95  BEFORE MEALS LESS THAN 100  2 hours AFTER MEAL LESS THAN 120

## 2020-10-18 NOTE — Progress Notes (Signed)
Name: Tamara Reyes  MRN/ DOB: [id number], Sep 11, 1995   Age/ Sex: 25 y.o., female    PCP: Kerin Perna, NP   Reason for Endocrinology Evaluation: Type 2 Diabetes Mellitus     Date of Initial Endocrinology Visit: 10/18/2020     PATIENT IDENTIFIER: Tamara Reyes is a 25 y.o. female with a past medical history of T2DM, PCOS, Fibromyalgia and Asthma. The patient presented for initial endocrinology clinic visit on 10/18/2020 for consultative assistance with her diabetes management.    HPI: Tamara Reyes is 14.3 weeks of gestation , she is accompanied by her boyfriend    Diagnosed with DM since the age of 57  Prior Medications tried/Intolerance: Metformin, Trulicity , but since pregnancy has been switched to Novolin-N but has not been picked it.  Currently checking blood sugars 1-2 x / day Hypoglycemia episodes : no               Hemoglobin A1c has ranged from 8.5% in 2022, peaking at 15.0% in 2017. Patient required assistance for hypoglycemia: no Patient has required hospitalization within the last 1 year from hyper or hypoglycemia: no  In terms of diet, the patient eats 2 meals a day, does not snack.   Dr. Audie Box   THYROID HISTORY: Has noted with thyromegly since the age of 75 . She has been noted to have a low TSH at 0.089 uIU/Ml . Prior to that she has normal TSH in 02/2020 at 0.464 uIU/mL    She has noted local neck swelling  Unknown FH of thyroid disease  Weight has been stable  Denies diarrhea  Denies palpitations but has occasional anxiety attacks.  HOME DIABETES REGIMEN: Novolin -N     Statin: no ACE-I/ARB: no Prior Diabetic Education: yes    METER DOWNLOAD SUMMARY: Did not bring      DIABETIC COMPLICATIONS: Microvascular complications:    Denies: CKD, neuropathy , retinopathy   Last eye exam: Completed 08/2019  Macrovascular complications:    Denies: CAD, PVD, CVA   PAST HISTORY: Past Medical History:  Past Medical History:  Diagnosis Date  .  Asthma   . COVID-19 08/16/2020  . DM (diabetes mellitus), type 2 ([place])   . Fibromyalgia   . Genital herpes   . History of marijuana use 2021  . Hypercholesteremia   . Hypertension   . Nexplanon insertion 08/26/2013   Lot #[id number] Exp 02/2016   . PCOS (polycystic ovarian syndrome)    Past Surgical History:  Past Surgical History:  Procedure Laterality Date  . abscess removal     . TONSILLECTOMY    . TONSILLECTOMY AND ADENOIDECTOMY     tonsils only      Social History:  reports that she quit smoking about 6 years ago. Her smoking use included cigarettes. She has never used smokeless tobacco. She reports current drug use. Drug: Marijuana. She reports that she does not drink alcohol. Family History:  Family History  Problem Relation Age of Onset  . Hypertension Mother   . Diabetes Mother   . Hypertension Maternal Grandmother   . Hypertension Maternal Grandfather   . Hypertension Paternal Grandmother   . Diabetes Paternal Grandmother      HOME MEDICATIONS: Allergies as of 10/18/2020   No Known Allergies     Medication List       Accurate as of October 18, 2020  1:33 PM. If you have any questions, ask your nurse or doctor.        Dexcom G6  Sensor Misc 1 Device by Does not apply route as needed for up to 10 days (change device every 10 days).   Dexcom G6 Transmitter Misc 1 Device by Does not apply route every 3 (three) months.   hydrOXYzine 25 MG tablet Commonly known as: ATARAX/VISTARIL Take 1 tablet (25 mg total) by mouth at bedtime.   insulin NPH Human 100 UNIT/ML injection Commonly known as: NOVOLIN N Inject 0.13 mLs (13 Units total) into the skin daily with breakfast.   omeprazole 20 MG capsule Commonly known as: PRILOSEC Take 1 capsule (20 mg total) by mouth in the morning and at bedtime.   Pen Needles 31G X 8 MM Misc Inject 1 application into the skin at bedtime.   prenatal multivitamin Tabs tablet Take 1 tablet by mouth daily at 12 noon.    sertraline 50 MG tablet Commonly known as: Zoloft Take 1 tablet (50 mg total) by mouth daily.   valACYclovir 500 MG tablet Commonly known as: Valtrex Take 1 tablet (500 mg total) by mouth daily.   valACYclovir 500 MG tablet Commonly known as: Valtrex Take 1 tablet (500 mg total) by mouth 2 (two) times daily.        ALLERGIES: No Known Allergies   REVIEW OF SYSTEMS: A comprehensive ROS was conducted with the patient and is negative except as per HPI and below:  ROS    OBJECTIVE:   VITAL SIGNS: BP 140/82   Pulse 90   Ht 5\' 8"  (1.727 m)   Wt 168 lb 6 oz (76.4 kg)   LMP 07/09/2020   SpO2 98%   BMI 25.60 kg/m    PHYSICAL EXAM:  General: Pt appears well and is in NAD  HEENT:  Eyes: External eye exam normal without stare, lid lag or exophthalmos.  EOM intact.  PERRL.  Neck: General: Supple without adenopathy or carotid bruits. Thyroid: Thyroid size is prominent.  Nonodules appreciated. No thyroid bruit.  Lungs: Clear with good BS bilat with no rales, rhonchi, or wheezes  Heart: RRR with normal S1 and S2 and no gallops; no murmurs; no rub  Abdomen: Normoactive bowel sounds, soft, nontender, without masses or organomegaly palpable  Extremities:  Lower extremities - No pretibial edema. No lesions.  Neuro: MS is good with appropriate affect, pt is alert and Ox3     DATA REVIEWED:  Lab Results  Component Value Date   HGBA1C 8.5 (H) 09/22/2020   HGBA1C 9.5 (A) 07/20/2020   HGBA1C 12.9 (A) 02/23/2020   Lab Results  Component Value Date   LDLCALC 186 (H) 03/05/2020   CREATININE 0.53 (L) 09/22/2020   Lab Results  Component Value Date   MICRALBCREAT <7 10/16/2018    Lab Results  Component Value Date   CHOL 258 (H) 03/05/2020   HDL 54 03/05/2020   LDLCALC 186 (H) 03/05/2020   TRIG 104 03/05/2020   CHOLHDL 4.8 (H) 03/05/2020        ASSESSMENT / PLAN / RECOMMENDATIONS:   1) Type 2 Diabetes Mellitus, Poorly controlled, Without complications - Most recent  A1c of 8.5 %. Goal A1c < 6.5 %.   Plan: GENERAL: I have discussed with the patient the pathophysiology of diabetes. We went over the natural progression of the disease. We talked about both insulin resistance and insulin deficiency. We stressed the importance of lifestyle changes including diet and exercise. I explained the complications associated with diabetes including retinopathy, nephropathy, neuropathy as well as increased risk of cardiovascular disease. We went over the benefit seen  with glycemic control.    I explained to the patient that diabetic patients are at higher than normal risk for amputations.   We discussed in detail the importance of euglycemia during pregnancy, we discussed the  Increased fetal risk of congenital malformations but she continues to believe that she is doing well   Her ObGyn prescribed Novolin-N in February but she has yet to pick. I recommended starting her on multiple daily injections of insulin but she was questioning this and does not feel she needs it, I recommended taking Novolin-N BID based on half life but she would like to take it once a day and proceed from there.   I have advised her to check glucose before each meal and 2 hours post-meal, She is waiting on Dexcom through her Ob   I will see her in 4 weeks with glucose data   I will also check for GAD-65 and Iselt cell Ab's  We discussed glucose goals as below    TIMING  BLOOD SUGAR GOALS  FASTING (before breakfast)  60 - 95  BEFORE MEALS LESS THAN 100  2 hours AFTER MEAL LESS THAN 120      MEDICATIONS:  Novolin-N 13 units , she wants to start daily first   EDUCATION / INSTRUCTIONS:  BG monitoring instructions: Patient is instructed to check her blood sugars 5 times a day, before meals, 2 hr post-prandial and bedtime   Call [facility] if: BG persistently < 60  . I reviewed the Rule of 15 for the treatment of hypoglycemia in detail with the patient. Literature  supplied.   2) Diabetic complications:   Eye: Does not have known diabetic retinopathy.   Neuro/ Feet: Does nothave known diabetic peripheral neuropathy.  Renal: Patient does not have known baseline CKD.     3) Dyslipidemia:  - LDL as high as 186 mg/dL in 02/2020 - Statin contra-indicated during pregnancy    4) Hyperthyroidism:  - She is clinically euthyroid  - Discussed D/D pregnancy related hyperthyroidism, graves' disease , vs autonomous nodule(s) - Will check TSH, TT4 and TRAb  - Will discussed treatment following results    She will stop by this week as our phlebotomist is not available today    F/U in 4 weeks      Signed electronically by: Mack Guise, MD  [facility] Endocrinology  [facility] [address] [place], [place] [address] Phone: [phone] FAX: [phone]   CC: Kerin Perna, NP [id number] [place] [address] Phone: [phone]  Fax: [phone]    Return to Endocrinology clinic as below: Future Appointments  Date Time Provider [place]  10/21/2020  2:55 PM Woodroe Mode, MD [facility]  10/26/2020  2:15 PM [facility]  11/23/2020 12:45 PM [facility] NURSE [facility]  11/23/2020  1:00 PM [facility] US1 [facility]

## 2020-10-19 ENCOUNTER — Telehealth: Payer: Self-pay | Admitting: Registered"

## 2020-10-19 ENCOUNTER — Telehealth: Payer: Self-pay | Admitting: Lactation Services

## 2020-10-19 ENCOUNTER — Other Ambulatory Visit (INDEPENDENT_AMBULATORY_CARE_PROVIDER_SITE_OTHER): Payer: Self-pay | Admitting: Primary Care

## 2020-10-19 DIAGNOSIS — F411 Generalized anxiety disorder: Secondary | ICD-10-CM

## 2020-10-19 DIAGNOSIS — F418 Other specified anxiety disorders: Secondary | ICD-10-CM

## 2020-10-19 LAB — T4, FREE: Free T4: 1.43 ng/dL (ref 0.82–1.77)

## 2020-10-19 LAB — SPECIMEN STATUS REPORT

## 2020-10-19 MED FILL — SERTRALINE HCL 50 MG TABLET: 50 | 30 days supply | Qty: 30 | Fill #0

## 2020-10-19 MED FILL — hydrOXYzine HCL 25 MG TABS: 25 | 30 days supply | Qty: 30 | Fill #0

## 2020-10-19 MED FILL — HumuLIN N 100 UNIT/ML SUSP: 100 | 28 days supply | Qty: 10 | Fill #0

## 2020-10-19 NOTE — Telephone Encounter (Signed)
Patient called to check status of Dexcom prescription  Pre-authorization for Dexcom G6 was denied 10/19/20  CoverMyMeds Request Key: [id number] PA created on: 2020-10-11   RD suggested that patient try getting the CGM prescription through her PCP or Endocrinologist and that they likely will have more experience with having CGMs approved for their patients.

## 2020-10-19 NOTE — Telephone Encounter (Signed)
Called patient with results of [facility]. 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   Reviewed calling [facility] at [phone] to schedule a Telephone Genetic counseling session. Discuss they will recommend FOB be tested also to see if he carries the same gene.   Patients questions answered. She will call with any further questions or concerns.

## 2020-10-20 ENCOUNTER — Encounter: Payer: Self-pay | Admitting: Obstetrics and Gynecology

## 2020-10-20 ENCOUNTER — Encounter: Payer: Self-pay | Admitting: *Deleted

## 2020-10-20 DIAGNOSIS — D563 Thalassemia minor: Secondary | ICD-10-CM | POA: Insufficient documentation

## 2020-10-20 MED FILL — VALACYCLOVIR HCL 500 MG TAB: 500 | 30 days supply | Qty: 30 | Fill #0

## 2020-10-21 ENCOUNTER — Ambulatory Visit (INDEPENDENT_AMBULATORY_CARE_PROVIDER_SITE_OTHER): Payer: Medicaid Other | Admitting: Obstetrics & Gynecology

## 2020-10-21 ENCOUNTER — Encounter: Payer: Self-pay | Admitting: Obstetrics & Gynecology

## 2020-10-21 ENCOUNTER — Other Ambulatory Visit (INDEPENDENT_AMBULATORY_CARE_PROVIDER_SITE_OTHER): Payer: Medicaid Other

## 2020-10-21 ENCOUNTER — Other Ambulatory Visit: Payer: Self-pay

## 2020-10-21 ENCOUNTER — Other Ambulatory Visit: Payer: Medicaid Other

## 2020-10-21 VITALS — BP 114/81 | HR 82 | Wt 170.2 lb

## 2020-10-21 DIAGNOSIS — O0990 Supervision of high risk pregnancy, unspecified, unspecified trimester: Secondary | ICD-10-CM

## 2020-10-21 DIAGNOSIS — O24119 Pre-existing diabetes mellitus, type 2, in pregnancy, unspecified trimester: Secondary | ICD-10-CM | POA: Diagnosis not present

## 2020-10-21 DIAGNOSIS — Z8739 Personal history of other diseases of the musculoskeletal system and connective tissue: Secondary | ICD-10-CM

## 2020-10-21 DIAGNOSIS — O10919 Unspecified pre-existing hypertension complicating pregnancy, unspecified trimester: Secondary | ICD-10-CM

## 2020-10-21 DIAGNOSIS — E0590 Thyrotoxicosis, unspecified without thyrotoxic crisis or storm: Secondary | ICD-10-CM | POA: Diagnosis not present

## 2020-10-21 LAB — TSH: TSH: 0.18 u[IU]/mL — ABNORMAL LOW (ref 0.35–4.50)

## 2020-10-21 NOTE — Progress Notes (Unsigned)
   PRENATAL VISIT NOTE  Subjective:  Tamara Reyes is a 25 y.o. G1P0000 at [redacted]w[redacted]d being seen today for ongoing prenatal care.  She is currently monitored for the following issues for this high-risk pregnancy and has Hyperlipidemia; Chronic hypertension during pregnancy, antepartum; Obesity; Pre-existing type 2 diabetes mellitus in pregnancy; Supervision of high risk pregnancy, antepartum; Low TSH level; Hyperthyroidism; and Alpha thalassemia silent carrier on their problem list.  Patient reports backache and leg pain chronic due to fibromyalgia.   . Vag. Bleeding: None.   . Denies leaking of fluid.   The following portions of the patient's history were reviewed and updated as appropriate: allergies, current medications, past family history, past medical history, past social history, past surgical history and problem list.   Objective:   Vitals:   10/21/20 1518  BP: 114/81  Pulse: 82  Weight: 170 lb 3.2 oz (77.2 kg)    Fetal Status: Fetal Heart Rate (bpm): 154         General:  Alert, oriented and cooperative. Patient is in no acute distress.  Skin: Skin is warm and dry. No rash noted.   Cardiovascular: Normal heart rate noted  Respiratory: Normal respiratory effort, no problems with respiration noted  Abdomen: Soft, gravid, appropriate for gestational age.  Pain/Pressure: Present     Pelvic: Cervical exam deferred        Extremities: Normal range of motion.  Edema: None  Mental Status: Normal mood and affect. Normal behavior. Normal judgment and thought content.   Assessment and Plan:  Pregnancy: G1P0000 at [redacted]w[redacted]d 1. Pre-existing type 2 diabetes mellitus during pregnancy, antepartum Followed by endocrinology, trying to get Dexcon device  2. Chronic hypertension during pregnancy, antepartum Under control  3. Supervision of high risk pregnancy, antepartum Low back and leg pain is chronic but worsening - Ambulatory referral to Physical Therapy  Preterm labor symptoms and general  obstetric precautions including but not limited to vaginal bleeding, contractions, leaking of fluid and fetal movement were reviewed in detail with the patient. Please refer to After Visit Summary for other counseling recommendations.   Return in about 4 weeks (around 11/18/2020).  Future Appointments  Date Time Provider [place]  10/26/2020  2:15 PM [facility]  11/22/2020  3:40 PM Shamleffer, Melanie Crazier, MD [facility] None  11/23/2020 12:45 PM [facility] NURSE [facility]  11/23/2020  1:00 PM [facility] US1 [facility] [place]    Emeterio Reeve, MD

## 2020-10-21 NOTE — Progress Notes (Signed)
Patient complains of muscle spasms in lower back and legs. States that pain can be unbearable sometimes, happens 2-3 times a day.

## 2020-10-21 NOTE — Patient Instructions (Signed)

## 2020-10-22 LAB — MICROALBUMIN / CREATININE URINE RATIO
Creatinine,U: 129.6 mg/dL
Microalb Creat Ratio: 2.3 mg/g (ref 0.0–30.0)
Microalb, Ur: 3 mg/dL — ABNORMAL HIGH (ref 0.0–1.9)

## 2020-10-26 ENCOUNTER — Other Ambulatory Visit: Payer: Medicaid Other

## 2020-11-06 LAB — ISLET CELL AB SCREEN RFLX TO TITER: ISLET CELL ANTIBODY SCREEN: NEGATIVE

## 2020-11-06 LAB — GLUTAMIC ACID DECARBOXYLASE AUTO ABS: Glutamic Acid Decarb Ab: <5 IU/mL (ref <5)

## 2020-11-06 LAB — TRAB (TSH RECEPTOR BINDING ANTIBODY): TRAB: <1 IU/L (ref <=2.00)

## 2020-11-06 LAB — T4: T4, Total: 14.5 ug/dL — ABNORMAL HIGH (ref 5.1–11.9)

## 2020-11-17 ENCOUNTER — Inpatient Hospital Stay (HOSPITAL_COMMUNITY)
Admission: AD | Admit: 2020-11-17 | Discharge: 2020-11-17 | Disposition: A | Discharge status: Home / Self Care | Payer: Medicaid Other | Attending: Obstetrics & Gynecology | Admitting: Obstetrics & Gynecology

## 2020-11-17 ENCOUNTER — Encounter (HOSPITAL_COMMUNITY): Payer: Self-pay | Admitting: Obstetrics & Gynecology

## 2020-11-17 ENCOUNTER — Other Ambulatory Visit: Payer: Self-pay

## 2020-11-17 DIAGNOSIS — N949 Unspecified condition associated with female genital organs and menstrual cycle: Secondary | ICD-10-CM

## 2020-11-17 DIAGNOSIS — O219 Vomiting of pregnancy, unspecified: Secondary | ICD-10-CM | POA: Diagnosis not present

## 2020-11-17 DIAGNOSIS — R109 Unspecified abdominal pain: Secondary | ICD-10-CM | POA: Diagnosis present

## 2020-11-17 DIAGNOSIS — R102 Pelvic and perineal pain: Secondary | ICD-10-CM | POA: Diagnosis not present

## 2020-11-17 DIAGNOSIS — O99612 Diseases of the digestive system complicating pregnancy, second trimester: Secondary | ICD-10-CM | POA: Diagnosis not present

## 2020-11-17 DIAGNOSIS — K5900 Constipation, unspecified: Secondary | ICD-10-CM | POA: Diagnosis not present

## 2020-11-17 DIAGNOSIS — Z8616 Personal history of COVID-19: Secondary | ICD-10-CM | POA: Diagnosis not present

## 2020-11-17 DIAGNOSIS — O210 Mild hyperemesis gravidarum: Secondary | ICD-10-CM | POA: Insufficient documentation

## 2020-11-17 DIAGNOSIS — Z3A18 18 weeks gestation of pregnancy: Secondary | ICD-10-CM | POA: Diagnosis not present

## 2020-11-17 DIAGNOSIS — Z87891 Personal history of nicotine dependence: Secondary | ICD-10-CM | POA: Insufficient documentation

## 2020-11-17 LAB — URINALYSIS, ROUTINE W REFLEX MICROSCOPIC
Bilirubin Urine: NEGATIVE
Glucose, UA: NEGATIVE mg/dL
Hgb urine dipstick: NEGATIVE
Ketones, ur: NEGATIVE mg/dL
Leukocytes,Ua: NEGATIVE
Nitrite: NEGATIVE
Protein, ur: NEGATIVE mg/dL
Specific Gravity, Urine: 1.013 (ref 1.005–1.030)
pH: 7 (ref 5.0–8.0)

## 2020-11-17 LAB — COMPREHENSIVE METABOLIC PANEL
ALT: 10 U/L (ref 0–44)
AST: 14 U/L — ABNORMAL LOW (ref 15–41)
Albumin: 2.6 g/dL — ABNORMAL LOW (ref 3.5–5.0)
Alkaline Phosphatase: 31 U/L — ABNORMAL LOW (ref 38–126)
Anion gap: 7 (ref 5–15)
BUN: 5 mg/dL — ABNORMAL LOW (ref 6–20)
CO2: 24 mmol/L (ref 22–32)
Calcium: 8.5 mg/dL — ABNORMAL LOW (ref 8.9–10.3)
Chloride: 103 mmol/L (ref 98–111)
Creatinine, Ser: 0.56 mg/dL (ref 0.44–1.00)
GFR, Estimated: >60 mL/min (ref >60)
Glucose, Bld: 180 mg/dL — ABNORMAL HIGH (ref 70–99)
Potassium: 3.7 mmol/L (ref 3.5–5.1)
Sodium: 134 mmol/L — ABNORMAL LOW (ref 135–145)
Total Bilirubin: 0.3 mg/dL (ref 0.3–1.2)
Total Protein: 5.5 g/dL — ABNORMAL LOW (ref 6.5–8.1)

## 2020-11-17 LAB — CBC
HCT: 30.3 % — ABNORMAL LOW (ref 36.0–46.0)
Hemoglobin: 9.9 g/dL — ABNORMAL LOW (ref 12.0–15.0)
MCH: 27.1 pg (ref 26.0–34.0)
MCHC: 32.7 g/dL (ref 30.0–36.0)
MCV: 83 fL (ref 80.0–100.0)
Platelets: 237 10*3/uL (ref 150–400)
RBC: 3.65 MIL/uL — ABNORMAL LOW (ref 3.87–5.11)
RDW: 13.4 % (ref 11.5–15.5)
WBC: 15.1 10*3/uL — ABNORMAL HIGH (ref 4.0–10.5)
nRBC: 0 % (ref 0.0–0.2)

## 2020-11-17 LAB — WET PREP, GENITAL
Clue Cells Wet Prep HPF POC: NONE SEEN
Sperm: NONE SEEN
Trich, Wet Prep: NONE SEEN
Yeast Wet Prep HPF POC: NONE SEEN

## 2020-11-17 LAB — LIPASE, BLOOD: Lipase: 26 U/L (ref 11–51)

## 2020-11-17 MED ORDER — DOCUSATE SODIUM 100 MG PO CAPS
100.0000 mg | ORAL_CAPSULE | Freq: Two times a day (BID) | ORAL | 2 refills | Status: DC | PRN
  Filled 2020-11-17: qty 30, 15d supply, fill #0

## 2020-11-17 MED ORDER — POLYETHYLENE GLYCOL 3350 17 GM/SCOOP PO POWD
17.0000 g | Freq: Every day | ORAL | 0 refills | Status: DC
  Filled 2020-11-17: qty 238, 14d supply, fill #0

## 2020-11-17 MED ORDER — PROMETHAZINE HCL 25 MG PO TABS
25.0000 mg | ORAL_TABLET | Freq: Every evening | ORAL | 2 refills | Status: DC | PRN
  Filled 2020-11-17: qty 30, 30d supply, fill #0

## 2020-11-17 NOTE — MAU Note (Signed)
Presents with sharp pain in right lower abdomen that began between 0200 and 0300 this morning.  Took Tylenol extra strength x2 and no relief noted.  Denies  VB or LOF.

## 2020-11-17 NOTE — MAU Provider Note (Signed)
Chief Complaint: Abdominal Pain   Event Date/Time   First Provider Initiated Contact with Patient 11/17/20 1749      SUBJECTIVE HPI: Tamara Reyes is a 25 y.o. G1P0000 at [redacted]w[redacted]d by LMP who presents to maternity admissions reporting lower abdominal pain with movement, n/v, and constipation.  She reports chronic constipation and n/v in the pregnancy that is unchanged. She is not taking any medications at this time.  Her pain is mostly in the RLQ and only occurs when she changes positions or rolls over in bed.  She is feeling fetal movement. She denies any LOF or vaginal bleeding.  She reports that she does not plan to have her baby in the hospital and has a doula friend who will be with her to have her baby at home.     Location: RLQ Quality: sharp, sudden Severity: 8/10 on pain scale Duration: 1 day Timing: intermittent  Modifying factors: with movement Associated signs and symptoms: n/v, constipation  HPI  Past Medical History:  Diagnosis Date  . Asthma   . COVID-19 08/16/2020  . DM (diabetes mellitus), type 2 ([place])   . Fibromyalgia   . Genital herpes   . History of marijuana use 2021  . Hypercholesteremia   . Hypertension   . Nexplanon insertion 08/26/2013   Lot #[id number] Exp 02/2016   . PCOS (polycystic ovarian syndrome)    Past Surgical History:  Procedure Laterality Date  . abscess removal     . TONSILLECTOMY    . TONSILLECTOMY AND ADENOIDECTOMY     tonsils only   Social History   Socioeconomic History  . Marital status: Single    Spouse name: Not on file  . Number of children: Not on file  . Years of education: Not on file  . Highest education level: Not on file  Occupational History  . Not on file  Tobacco Use  . Smoking status: Former Smoker    Types: Cigarettes    Quit date: 05/28/2014    Years since quitting: 6.4  . Smokeless tobacco: Never Used  Vaping Use  . Vaping Use: Never used  Substance and Sexual Activity  . Alcohol use: No  . Drug use:  Not Currently    Types: Marijuana    Comment: stopped once pregnant  . Sexual activity: Yes    Birth control/protection: None    Comment: had nuva ring, but it is out now  Other Topics Concern  . Not on file  Social History Narrative   Is in 12th grade at [employer] with mom   Social Determinants of Health   Financial Resource Strain: Not on file  Food Insecurity: No Food Insecurity  . Worried About Charity fundraiser in the Last Year: Never true  . Ran Out of Food in the Last Year: Never true  Transportation Needs: No Transportation Needs  . Lack of Transportation (Medical): No  . Lack of Transportation (Non-Medical): No  Physical Activity: Not on file  Stress: Not on file  Social Connections: Not on file  Intimate Partner Violence: Not At Risk  . Fear of Current or Ex-Partner: No  . Emotionally Abused: No  . Physically Abused: No  . Sexually Abused: No   No current facility-administered medications on file prior to encounter.   Current Outpatient Medications on File Prior to Encounter  Medication Sig Dispense Refill  . hydrOXYzine (ATARAX/VISTARIL) 25 MG tablet TAKE 1 TABLET (25 MG TOTAL) BY MOUTH AT BEDTIME. 30 tablet 1  .  sertraline (ZOLOFT) 50 MG tablet TAKE 1 TABLET (50 MG TOTAL) BY MOUTH DAILY. 30 tablet 5  . valACYclovir (VALTREX) 500 MG tablet Take 1 tablet (500 mg total) by mouth daily. 30 tablet 12  . Continuous Blood Gluc Sensor (DEXCOM G6 SENSOR) MISC 1 DEVICE BY DOES NOT APPLY ROUTE AS NEEDED FOR UP TO 10 DAYS (CHANGE DEVICE EVERY 10 DAYS). (Patient not taking: No sig reported) 3 each 6  . Continuous Blood Gluc Transmit (DEXCOM G6 TRANSMITTER) MISC 1 DEVICE BY DOES NOT APPLY ROUTE EVERY 3 (THREE) MONTHS. (Patient not taking: No sig reported) 3 each 1  . insulin NPH Human (NOVOLIN N) 100 UNIT/ML injection INJECT 0.13 MLS (13 UNITS TOTAL) INTO THE SKIN DAILY WITH BREAKFAST. 10 mL 3  . Insulin Pen Needle (PEN NEEDLES) 31G X 8 MM MISC Inject 1 application  into the skin at bedtime. 100 each 3  . omeprazole (PRILOSEC) 20 MG capsule TAKE 1 CAPSULE (20 MG TOTAL) BY MOUTH IN THE MORNING AND AT BEDTIME. 30 capsule 3  . Prenatal Vit-Fe Fumarate-FA (PRENATAL MULTIVITAMIN) TABS tablet Take 1 tablet by mouth daily at 12 noon.    . valACYclovir (VALTREX) 500 MG tablet TAKE 1 TABLET (500 MG TOTAL) BY MOUTH 2 (TWO) TIMES DAILY. 10 tablet 0  . [DISCONTINUED] escitalopram (LEXAPRO) 10 MG tablet Take 1 tablet (10 mg total) by mouth daily. (Patient not taking: Reported on 09/29/2020) 90 tablet 1  . [DISCONTINUED] insulin aspart (NOVOLOG) 100 UNIT/ML injection Inject 8 Units into the skin 3 (three) times daily before meals. (Patient not taking: Reported on 09/17/2020) 10 mL 12   No Known Allergies  ROS:  Review of Systems  Constitutional: Negative for chills, fatigue and fever.  Eyes: Negative for visual disturbance.  Respiratory: Negative for shortness of breath.   Cardiovascular: Negative for chest pain.  Gastrointestinal: Positive for abdominal pain, constipation, nausea and vomiting.  Genitourinary: Negative for difficulty urinating, dysuria, flank pain, pelvic pain, vaginal bleeding, vaginal discharge and vaginal pain.  Neurological: Negative for dizziness and headaches.  Psychiatric/Behavioral: Negative.      I have reviewed patient's Past Medical Hx, Surgical Hx, Family Hx, Social Hx, medications and allergies.   Physical Exam   Patient Vitals for the past 24 hrs:  BP Temp Temp src Pulse Resp SpO2 Height Weight  11/17/20 1807 118/77 -- -- 79 -- -- -- --  11/17/20 1637 117/63 98 F (36.7 C) Oral 87 19 100 % -- --  11/17/20 1633 -- -- -- -- -- -- 5\' 8"  (1.727 m) 79.3 kg   Constitutional: Well-developed, well-nourished female in no acute distress.  Cardiovascular: normal rate Respiratory: normal effort GI: Abd soft, non-tender. Pos BS x 4 MS: Extremities nontender, no edema, normal ROM Neurologic: Alert and oriented x 4.  GU: Neg  CVAT.  Dilation: Closed Effacement (%): Thick Cervical Position: Posterior Exam by:: Fatima Blank, CNM  FHT 156 by doppler  LAB RESULTS Results for orders placed or performed during the hospital encounter of 11/17/20 (from the past 24 hour(s))  Urinalysis, Routine w reflex microscopic Urine, Clean Catch     Status: None   Collection Time: 11/17/20  4:54 PM  Result Value Ref Range   Color, Urine YELLOW YELLOW   APPearance CLEAR CLEAR   Specific Gravity, Urine 1.013 1.005 - 1.030   pH 7.0 5.0 - 8.0   Glucose, UA NEGATIVE NEGATIVE mg/dL   Hgb urine dipstick NEGATIVE NEGATIVE   Bilirubin Urine NEGATIVE NEGATIVE   Ketones, ur NEGATIVE NEGATIVE  mg/dL   Protein, ur NEGATIVE NEGATIVE mg/dL   Nitrite NEGATIVE NEGATIVE   Leukocytes,Ua NEGATIVE NEGATIVE  CBC     Status: Abnormal   Collection Time: 11/17/20  5:20 PM  Result Value Ref Range   WBC 15.1 (H) 4.0 - 10.5 K/uL   RBC 3.65 (L) 3.87 - 5.11 MIL/uL   Hemoglobin 9.9 (L) 12.0 - 15.0 g/dL   HCT 30.3 (L) 36.0 - 46.0 %   MCV 83.0 80.0 - 100.0 fL   MCH 27.1 26.0 - 34.0 pg   MCHC 32.7 30.0 - 36.0 g/dL   RDW 13.4 11.5 - 15.5 %   Platelets 237 150 - 400 K/uL   nRBC 0.0 0.0 - 0.2 %  Comprehensive metabolic panel     Status: Abnormal   Collection Time: 11/17/20  5:20 PM  Result Value Ref Range   Sodium 134 (L) 135 - 145 mmol/L   Potassium 3.7 3.5 - 5.1 mmol/L   Chloride 103 98 - 111 mmol/L   CO2 24 22 - 32 mmol/L   Glucose, Bld 180 (H) 70 - 99 mg/dL   BUN 5 (L) 6 - 20 mg/dL   Creatinine, Ser 0.56 0.44 - 1.00 mg/dL   Calcium 8.5 (L) 8.9 - 10.3 mg/dL   Total Protein 5.5 (L) 6.5 - 8.1 g/dL   Albumin 2.6 (L) 3.5 - 5.0 g/dL   AST 14 (L) 15 - 41 U/L   ALT 10 0 - 44 U/L   Alkaline Phosphatase 31 (L) 38 - 126 U/L   Total Bilirubin 0.3 0.3 - 1.2 mg/dL   GFR, Estimated >60 >60 mL/min   Anion gap 7 5 - 15  Lipase, blood     Status: None   Collection Time: 11/17/20  5:20 PM  Result Value Ref Range   Lipase 26 11 - 51 U/L  Wet  prep, genital     Status: Abnormal   Collection Time: 11/17/20  5:30 PM  Result Value Ref Range   Yeast Wet Prep HPF POC NONE SEEN NONE SEEN   Trich, Wet Prep NONE SEEN NONE SEEN   Clue Cells Wet Prep HPF POC NONE SEEN NONE SEEN   WBC, Wet Prep HPF POC MODERATE (A) NONE SEEN   Sperm NONE SEEN     A/Positive/-- (02/09 1455)  IMAGING No results found.  MAU Management/MDM: Orders Placed This Encounter  Procedures  . Wet prep, genital  . Urinalysis, Routine w reflex microscopic Urine, Clean Catch  . CBC  . Comprehensive metabolic panel  . Lipase, blood  . Lab instructions  . Discharge patient    Meds ordered this encounter  Medications  . promethazine (PHENERGAN) 25 MG tablet    Sig: Take 1 tablet (25 mg total) by mouth at bedtime as needed for nausea or vomiting.    Dispense:  30 tablet    Refill:  2    Order Specific Question:   Supervising Provider    Answer:   Janyth Pupa [[id number]]  . docusate sodium (COLACE) 100 MG capsule    Sig: Take 1 capsule (100 mg total) by mouth 2 (two) times daily as needed.    Dispense:  30 capsule    Refill:  2    Order Specific Question:   Supervising Provider    Answer:   Janyth Pupa [[id number]]  . polyethylene glycol powder (GLYCOLAX/MIRALAX) 17 GM/SCOOP powder    Sig: Take 17 g by mouth daily.    Dispense:  255 g    Refill:  0    Order Specific Question:   Supervising Provider    Answer:   Janyth Pupa [[id number]]    No evidence of preterm labor on exam today.  No acute abdomen and pt without pain at rest.  Pain is most c/w round ligament pain exacerbated by constipation and n/v.  Rx for phenergan 25 mg Q HS PRN since she often wakes up in the night with vomiting.  Rx for Colace and Miralax for constipation. Rest/ice/heat/warm bath/Tylenol for round ligament pain.    Pt reports she wanted a waterbirth and midwives but has only seen doctors in the office with her DM and CHTN.  She now plans to deliver at home, possibly with her doula  friend.  I discussed risks of homebirth with her DM and HTN and suggested instead that she see midwives in our office and talk about natural birth in the hospital options.  Pt agreed to this plan and is aware that she will need to see doctors for some visits due to her high risk conditions.  Pt discharged with strict return precautions.  ASSESSMENT 1. Round ligament pain   2. Constipation during pregnancy in second trimester   3. Nausea and vomiting during pregnancy prior to [redacted] weeks gestation   4. [redacted] weeks gestation of pregnancy     PLAN Discharge home Allergies as of 11/17/2020   No Known Allergies     Medication List    TAKE these medications   Dexcom G6 Sensor Misc 1 DEVICE BY DOES NOT APPLY ROUTE AS NEEDED FOR UP TO 10 DAYS (CHANGE DEVICE EVERY 10 DAYS).   Dexcom G6 Transmitter Misc 1 DEVICE BY DOES NOT APPLY ROUTE EVERY 3 (THREE) MONTHS.   docusate sodium 100 MG capsule Commonly known as: COLACE Take 1 capsule (100 mg total) by mouth 2 (two) times daily as needed.   HumuLIN N 100 UNIT/ML injection Generic drug: insulin NPH Human INJECT 0.13 MLS (13 UNITS TOTAL) INTO THE SKIN DAILY WITH BREAKFAST.   hydrOXYzine 25 MG tablet Commonly known as: ATARAX/VISTARIL TAKE 1 TABLET (25 MG TOTAL) BY MOUTH AT BEDTIME.   omeprazole 20 MG capsule Commonly known as: PRILOSEC TAKE 1 CAPSULE (20 MG TOTAL) BY MOUTH IN THE MORNING AND AT BEDTIME.   Pen Needles 31G X 8 MM Misc Inject 1 application into the skin at bedtime.   polyethylene glycol powder 17 GM/SCOOP powder Commonly known as: GLYCOLAX/MIRALAX Take 17 g by mouth daily.   prenatal multivitamin Tabs tablet Take 1 tablet by mouth daily at 12 noon.   promethazine 25 MG tablet Commonly known as: PHENERGAN Take 1 tablet (25 mg total) by mouth at bedtime as needed for nausea or vomiting.   sertraline 50 MG tablet Commonly known as: ZOLOFT TAKE 1 TABLET (50 MG TOTAL) BY MOUTH DAILY.   valACYclovir 500 MG tablet Commonly  known as: Valtrex Take 1 tablet (500 mg total) by mouth daily.   valACYclovir 500 MG tablet Commonly known as: VALTREX TAKE 1 TABLET (500 MG TOTAL) BY MOUTH 2 (TWO) TIMES DAILY.       Follow-up [place] [facility] Follow up.   Specialty: Obstetrics and Gynecology Why: As scheduled, return to MAU as needed for emergencies Contact information: [place] [phone]              Fatima Blank Certified Nurse-Midwife 11/17/2020  7:59 PM

## 2020-11-18 ENCOUNTER — Other Ambulatory Visit: Payer: Self-pay

## 2020-11-18 ENCOUNTER — Ambulatory Visit (INDEPENDENT_AMBULATORY_CARE_PROVIDER_SITE_OTHER): Payer: Medicaid Other | Admitting: Obstetrics and Gynecology

## 2020-11-18 VITALS — BP 110/72 | HR 94 | Wt 176.0 lb

## 2020-11-18 DIAGNOSIS — O0990 Supervision of high risk pregnancy, unspecified, unspecified trimester: Secondary | ICD-10-CM

## 2020-11-18 DIAGNOSIS — O10919 Unspecified pre-existing hypertension complicating pregnancy, unspecified trimester: Secondary | ICD-10-CM

## 2020-11-18 DIAGNOSIS — B009 Herpesviral infection, unspecified: Secondary | ICD-10-CM

## 2020-11-18 DIAGNOSIS — O24119 Pre-existing diabetes mellitus, type 2, in pregnancy, unspecified trimester: Secondary | ICD-10-CM

## 2020-11-18 DIAGNOSIS — E0590 Thyrotoxicosis, unspecified without thyrotoxic crisis or storm: Secondary | ICD-10-CM

## 2020-11-18 DIAGNOSIS — O99012 Anemia complicating pregnancy, second trimester: Secondary | ICD-10-CM

## 2020-11-18 DIAGNOSIS — Z8616 Personal history of COVID-19: Secondary | ICD-10-CM

## 2020-11-18 LAB — GC/CHLAMYDIA PROBE AMP (~~LOC~~) NOT AT ARMC
Chlamydia: NEGATIVE
Comment: NEGATIVE
Comment: NORMAL
Neisseria Gonorrhea: NEGATIVE

## 2020-11-18 NOTE — Progress Notes (Signed)
    PRENATAL VISIT NOTE  Subjective:  Tamara Reyes is a 25 y.o. G1P0000 at [redacted]w[redacted]d being seen today for ongoing prenatal care.  She is currently monitored for the following issues for this high-risk pregnancy and has Hyperlipidemia; Chronic hypertension during pregnancy, antepartum; Pre-existing type 2 diabetes mellitus in pregnancy; Supervision of high risk pregnancy, antepartum; Hyperthyroidism; Alpha thalassemia silent carrier; H/O fibromyalgia; HSV-2 infection; Anemia during pregnancy in second trimester; and History of COVID-19 on their problem list.  Patient reports no complaints.  Contractions: Not present. Vag. Bleeding: None.  Movement: Absent. Denies leaking of fluid.   The following portions of the patient's history were reviewed and updated as appropriate: allergies, current medications, past family history, past medical history, past social history, past surgical history and problem list.   Objective:   Vitals:   11/18/20 1503  BP: 110/72  Pulse: 94  Weight: 176 lb (79.8 kg)    Fetal Status: Fetal Heart Rate (bpm): 154   Movement: Absent     General:  Alert, oriented and cooperative. Patient is in no acute distress.  Skin: Skin is warm and dry. No rash noted.   Cardiovascular: Normal heart rate noted  Respiratory: Normal respiratory effort, no problems with respiration noted  Abdomen: Soft, gravid, appropriate for gestational age.  Pain/Pressure: Present     Pelvic: Cervical exam deferred        Extremities: Normal range of motion.  Edema: None  Mental Status: Normal mood and affect. Normal behavior. Normal judgment and thought content.   Assessment and Plan:  Pregnancy: G1P0000 at [redacted]w[redacted]d 1. Supervision of high risk pregnancy, antepartum Routine care. Low dose ASA recommended and pt amenable to this; rx sent in - AFP, Serum, Open Spina Bifida - Anemia Profile B - T4, free - Hemoglobin A1c - Ambulatory referral to Pediatric Cardiology  2. Chronic hypertension during  pregnancy, antepartum Doing well on no meds  3. Pre-existing type 2 diabetes mellitus during pregnancy, antepartum Pt confirms on novolin 13 qday. She is not checking her sugars much at all. Importance of checking sugars d/w her especially in terms of maternal and fetal risks. She states she does not like checking her sugars qid. DM education has been trying to get her a continuous CBG meter; they are out of the office this week but RN to follow up and see about getting this meter. Pt followed by endocrine.  Referral for fetal echo sent.   a1c ordered  4. Hyperthyroidism Followed by endocrine. Has visit upcoming. Since getting afp, I will get some basic labs  5. Anemia during pregnancy in second trimester Asymptomatic. See above  6. History of HSV-2 infection No current issues  7. History of COVID-19 No current issues  Preterm labor symptoms and general obstetric precautions including but not limited to vaginal bleeding, contractions, leaking of fluid and fetal movement were reviewed in detail with the patient. Please refer to After Visit Summary for other counseling recommendations.   Return in about 2 weeks (around 12/02/2020) for in person, md visit.  Future Appointments  Date Time Provider [place]  11/22/2020  3:40 PM Shamleffer, Melanie Crazier, MD [facility] None  11/23/2020 12:45 PM [facility] NURSE [facility]  11/23/2020  1:00 PM [facility] US1 [facility]  12/06/2020  3:55 PM Chancy Milroy, MD [facility]  12/08/2020  3:30 PM Desenglau, Tommy Rainwater, PT [facility]  12/29/2020  2:00 PM Dory Horn, LCSW [facility] None    Aletha Halim, MD

## 2020-11-19 ENCOUNTER — Telehealth (INDEPENDENT_AMBULATORY_CARE_PROVIDER_SITE_OTHER): Payer: Medicaid Other | Admitting: *Deleted

## 2020-11-19 ENCOUNTER — Other Ambulatory Visit: Payer: Self-pay

## 2020-11-19 DIAGNOSIS — O24119 Pre-existing diabetes mellitus, type 2, in pregnancy, unspecified trimester: Secondary | ICD-10-CM

## 2020-11-19 DIAGNOSIS — E119 Type 2 diabetes mellitus without complications: Secondary | ICD-10-CM

## 2020-11-19 DIAGNOSIS — Z3A00 Weeks of gestation of pregnancy not specified: Secondary | ICD-10-CM

## 2020-11-19 DIAGNOSIS — O24919 Unspecified diabetes mellitus in pregnancy, unspecified trimester: Secondary | ICD-10-CM

## 2020-11-19 DIAGNOSIS — O0990 Supervision of high risk pregnancy, unspecified, unspecified trimester: Secondary | ICD-10-CM

## 2020-11-19 NOTE — Progress Notes (Signed)
I connected with  Faythe Dingwall on 11/19/20 at  9:00 AM EDT by telephone and verified that I am speaking with the correct person using two identifiers.   I discussed the limitations, risks, security and privacy concerns of performing an evaluation and management service by telephone and the availability of in person appointments. I also discussed with the patient that there may be a patient responsible charge related to this service. The patient expressed understanding and agreed to proceed.  Pt stated the name of her glucose meter is [employer]. We discussed the issues regarding the meter and she stated that it will not charge and the screen is blank. I offered for pt to come to office today for replacement meter and she declined. Pt expressed anger and frustration about this problem and since she was @ the office yesterday did not want to return today. She is waiting to hear if her insurance will approve the Dexcom so that she will not have to perform fingersticks daily. Pt is unwilling to check her blood sugar 4 times daily. I advised that another member of the clinical team is working on this issue and we will be able to speak with the Diabetes educator next week to assist with trouble shooting this problem. I asked pt to bring her meter to office on 4/12 when she comes for Ultrasound appointment and she agreed. We will plan to have her speak with Levada Dy - Diabetes Educator @ that time. Pt voiced understanding.   Alante Weimann, Ronnell Freshwater, RN 11/19/2020  9:59 AM

## 2020-11-20 LAB — ANEMIA PROFILE B
Basophils Absolute: 0 10*3/uL (ref 0.0–0.2)
Basos: 0 %
EOS (ABSOLUTE): 0 10*3/uL (ref 0.0–0.4)
Eos: 0 %
Ferritin: 17 ng/mL (ref 15–150)
Folate: 16.5 ng/mL (ref >3.0)
Hematocrit: 30.5 % — ABNORMAL LOW (ref 34.0–46.6)
Hemoglobin: 10.3 g/dL — ABNORMAL LOW (ref 11.1–15.9)
Immature Grans (Abs): 0.1 10*3/uL (ref 0.0–0.1)
Immature Granulocytes: 1 %
Iron Saturation: 13 % — ABNORMAL LOW (ref 15–55)
Iron: 50 ug/dL (ref 27–159)
Lymphocytes Absolute: 2.7 10*3/uL (ref 0.7–3.1)
Lymphs: 21 %
MCH: 26.9 pg (ref 26.6–33.0)
MCHC: 33.8 g/dL (ref 31.5–35.7)
MCV: 80 fL (ref 79–97)
Monocytes Absolute: 1 10*3/uL — ABNORMAL HIGH (ref 0.1–0.9)
Monocytes: 8 %
Neutrophils Absolute: 9.2 10*3/uL — ABNORMAL HIGH (ref 1.4–7.0)
Neutrophils: 70 %
Platelets: 245 10*3/uL (ref 150–450)
RBC: 3.83 x10E6/uL (ref 3.77–5.28)
RDW: 13.1 % (ref 11.7–15.4)
Retic Ct Pct: 1.7 % (ref 0.6–2.6)
Total Iron Binding Capacity: 378 ug/dL (ref 250–450)
UIBC: 328 ug/dL (ref 131–425)
Vitamin B-12: 363 pg/mL (ref 232–1245)
WBC: 13.1 10*3/uL — ABNORMAL HIGH (ref 3.4–10.8)

## 2020-11-20 LAB — AFP, SERUM, OPEN SPINA BIFIDA
AFP MoM: 1.22
AFP Value: 59.8 ng/mL
Gest. Age on Collection Date: 18.6 weeks
Maternal Age At EDD: 25 yr
OSBR Risk 1 IN: 10000
Test Results:: NEGATIVE
Weight: 176 [lb_av]

## 2020-11-20 LAB — HEMOGLOBIN A1C
Est. average glucose Bld gHb Est-mCnc: 140 mg/dL
Hgb A1c MFr Bld: 6.5 % — ABNORMAL HIGH (ref 4.8–5.6)

## 2020-11-20 LAB — T4, FREE: Free T4: 1.16 ng/dL (ref 0.82–1.77)

## 2020-11-22 ENCOUNTER — Encounter: Payer: Self-pay | Admitting: Internal Medicine

## 2020-11-22 ENCOUNTER — Encounter: Payer: Self-pay | Admitting: *Deleted

## 2020-11-22 ENCOUNTER — Ambulatory Visit (INDEPENDENT_AMBULATORY_CARE_PROVIDER_SITE_OTHER): Payer: Medicaid Other | Admitting: Internal Medicine

## 2020-11-22 ENCOUNTER — Other Ambulatory Visit: Payer: Self-pay

## 2020-11-22 VITALS — BP 114/76 | Ht 68.0 in | Wt 177.5 lb

## 2020-11-22 DIAGNOSIS — O24119 Pre-existing diabetes mellitus, type 2, in pregnancy, unspecified trimester: Secondary | ICD-10-CM

## 2020-11-22 DIAGNOSIS — E0590 Thyrotoxicosis, unspecified without thyrotoxic crisis or storm: Secondary | ICD-10-CM

## 2020-11-22 LAB — POCT GLUCOSE (DEVICE FOR HOME USE): POC Glucose: 126 mg/dl — AB (ref 70–99)

## 2020-11-22 MED ORDER — DEXCOM G6 SENSOR MISC: 1.0000 | 33 refills | Status: DC

## 2020-11-22 MED ORDER — DEXCOM G6 TRANSMITTER MISC: 1.0000 | 3 refills | Status: DC

## 2020-11-22 NOTE — Progress Notes (Signed)
Name: Tamara Reyes  Age/ Sex: 25 y.o., female   MRN/ DOB: [id number], 13-Jul-1996     PCP: Tamara Penny, NP   Reason for Endocrinology Evaluation: Type 2 Diabetes Mellitus  Initial Endocrine Consultative Visit: 10/18/2020    PATIENT IDENTIFIER: Tamara Reyes is a 25 y.o. female with a past medical history of T2DM, PCOS , asthma and fibromyalgia. The patient has followed with Endocrinology clinic since 10/18/2020 for consultative assistance with management of her diabetes.  DIABETIC HISTORY:  Tamara Reyes was diagnosed with DM at age 22, she was on metformin at some point. Her hemoglobin A1c has ranged from 8.5% in 2022, peaking at 15.0% in 2017.   On her initial visit to our clinic she was 14.[redacted] weeks pregnant with an A1c of 8.5 % she has not started her Novolin Mix at the time and declined to take it twice a day and chose to use it once a day  Despite my advise that it will not last her all day.   THYROID HISTORY: Has noted with thyromegly since the age of 67 . She has been noted to have a low TSH at 0.089 uIU/Ml . Prior to that she has normal TSH in 02/2020 at 0.464 uIU/mL . No treatment was needed  TRAB negative   OB- Dr. Ilda Reyes  SUBJECTIVE:   During the last visit (/2022): A1c 8.5 % declined increasing insulin and opted for once daily       Today (11/22/2020): Tamara Reyes is here for a follow up on diabetes and hyperthyroidism during pregnancy. She is currently at 19.3 weeks of gestation.  She checks her blood sugars occasionally  times daily. The patient has not had hypoglycemic episodes since the last clinic visit.    She has been taking insulin between once  Nausea has improved    HOME DIABETES REGIMEN:  Novolin-N 13 units daily in the morning       Statin: no ACE-I/ARB: no Prior Diabetic Education: yes      DIABETIC COMPLICATIONS: Microvascular complications:    Denies: CKD, retinopathy, neuropathy  Last Eye Exam: Completed 08/2019  Macrovascular complications:     Denies: CAD, CVA, PVD   HISTORY:  Past Medical History:  Past Medical History:  Diagnosis Date  . Asthma   . COVID-19 08/16/2020  . DM (diabetes mellitus), type 2 ([place])   . Fibromyalgia   . Genital herpes   . History of marijuana use 2021  . Hypercholesteremia   . Hypertension   . Nexplanon insertion 08/26/2013   Lot #[id number] Exp 02/2016   . PCOS (polycystic ovarian syndrome)     Past Surgical History:  Past Surgical History:  Procedure Laterality Date  . abscess removal     . TONSILLECTOMY    . TONSILLECTOMY AND ADENOIDECTOMY     tonsils only     Social History:  reports that she quit smoking about 6 years ago. Her smoking use included cigarettes. She has never used smokeless tobacco. She reports previous drug use. Drug: Marijuana. She reports that she does not drink alcohol. Family History:  Family History  Problem Relation Age of Onset  . Hypertension Mother   . Diabetes Mother   . Hypertension Maternal Grandmother   . Hypertension Maternal Grandfather   . Hypertension Paternal Grandmother   . Diabetes Paternal Grandmother       HOME MEDICATIONS: Allergies as of 11/22/2020   No Known Allergies     Medication List       Accurate  as of November 22, 2020 11:35 AM. If you have any questions, ask your nurse or doctor.        acetaminophen 500 MG tablet Commonly known as: TYLENOL Take 500 mg by mouth every 6 (six) hours as needed.   Dexcom G6 Sensor Misc 1 DEVICE BY DOES NOT APPLY ROUTE AS NEEDED FOR UP TO 10 DAYS (CHANGE DEVICE EVERY 10 DAYS).   Dexcom G6 Transmitter Misc 1 DEVICE BY DOES NOT APPLY ROUTE EVERY 3 (THREE) MONTHS.   docusate sodium 100 MG capsule Commonly known as: COLACE Take 1 capsule (100 mg total) by mouth 2 (two) times daily as needed.   HumuLIN N 100 UNIT/ML injection Generic drug: insulin NPH Human INJECT 0.13 MLS (13 UNITS TOTAL) INTO THE SKIN DAILY WITH BREAKFAST.   hydrOXYzine 25 MG tablet Commonly known as:  ATARAX/VISTARIL TAKE 1 TABLET (25 MG TOTAL) BY MOUTH AT BEDTIME.   omeprazole 20 MG capsule Commonly known as: PRILOSEC TAKE 1 CAPSULE (20 MG TOTAL) BY MOUTH IN THE MORNING AND AT BEDTIME.   Pen Needles 31G X 8 MM Misc Inject 1 application into the skin at bedtime.   polyethylene glycol powder 17 GM/SCOOP powder Commonly known as: GLYCOLAX/MIRALAX Take 17 g by mouth daily.   prenatal multivitamin Tabs tablet Take 1 tablet by mouth daily at 12 noon.   promethazine 25 MG tablet Commonly known as: PHENERGAN Take 1 tablet (25 mg total) by mouth at bedtime as needed for nausea or vomiting.   sertraline 50 MG tablet Commonly known as: ZOLOFT TAKE 1 TABLET (50 MG TOTAL) BY MOUTH DAILY.   valACYclovir 500 MG tablet Commonly known as: Valtrex Take 1 tablet (500 mg total) by mouth daily.        OBJECTIVE:   Vital Signs: BP 114/76   Ht 5\' 8"  (1.727 m)   Wt 177 lb 8 oz (80.5 kg)   LMP 07/09/2020   BMI 26.99 kg/m   Wt Readings from Last 3 Encounters:  11/18/20 176 lb (79.8 kg)  11/17/20 174 lb 14.4 oz (79.3 kg)  10/21/20 170 lb 3.2 oz (77.2 kg)     Exam: General: Pt appears well and is in NAD  Neck: General: Supple without adenopathy. Thyroid: Thyroid size normal.  No goiter or nodules appreciated.   Lungs: Clear with good BS bilat with no rales, rhonchi, or wheezes  Heart: RRR with normal S1 and S2 and no gallops; no murmurs; no rub  Abdomen: Normoactive bowel sounds, soft, nontender, without masses or organomegaly palpable  Extremities: No pretibial edema. No tremor. Normal strength and motion throughout. See detailed diabetic foot exam below.  Neuro: MS is good with appropriate affect, pt is alert and Ox3          DATA REVIEWED:  Lab Results  Component Value Date   HGBA1C 6.5 (H) 11/18/2020   HGBA1C 8.5 (H) 09/22/2020   HGBA1C 9.5 (A) 07/20/2020   Lab Results  Component Value Date   MICROALBUR 3.0 (H) 10/21/2020   LDLCALC 186 (H) 03/05/2020    CREATININE 0.56 11/17/2020   Lab Results  Component Value Date   MICRALBCREAT 2.3 10/21/2020     Lab Results  Component Value Date   CHOL 258 (H) 03/05/2020   HDL 54 03/05/2020   LDLCALC 186 (H) 03/05/2020   TRIG 104 03/05/2020   CHOLHDL 4.8 (H) 03/05/2020       Results for Tamara Reyes (MRN [id number]) as of 11/23/2020 12:36  Ref. Range 10/21/2020 14:21  TRAB Latest  Ref Range: <=2.00 IU/L <1.00  Results for MORGAINE, Reyes (MRN [id number]) as of 11/23/2020 12:36  Ref. Range 11/22/2020 15:54  POC Glucose Latest Ref Range: 70 - 99 mg/dl 126 (A)  Results for KANYON, Reyes (MRN [id number]) as of 11/23/2020 12:36  Ref. Range 11/18/2020 15:58  T4,Free(Direct) Latest Ref Range: 0.82 - 1.77 ng/dL 1.16    ASSESSMENT / PLAN / RECOMMENDATIONS:   1) Type 2 Diabetes Mellitus, During Second trimester,  Optimally controlled, Without complications - Most recent A1c of 6.5 %. Goal A1c < 6.5 %.    - A1c is at goal but we do not have glucose data, her glucose meter stopped working , I offered to give her one today but she is going to the [facility] office tomorrow and was promised one there.  - We will prescribe Dexcom since OB office have not been able to get this approved through her insurance  - Her in-office Bg was 126 mg/dL, which I explained to her is high for a 4 hour post-prandial  - She has not been using NPH consistently, some days she takes it in the morning and some days she takes in the evening  - We discussed the importance of taking insulin consistently , we again discussed the half life of NPH and its important to be taken BID , and she may choose to take it at lesser dose if concerned about hypoglycemia.    MEDICATIONS:  NPH 13 units daily   EDUCATION / INSTRUCTIONS:  BG monitoring instructions: Patient is instructed to check her blood sugars 7 times a day, before meals, bedtime and 2 hrs post prandial   Call [place] [facility] if: BG persistently < 70  . I reviewed the Rule of 15 for  the treatment of hypoglycemia in detail with the patient. Literature supplied.   2) Diabetic complications:   Eye: Does not have known diabetic retinopathy.   Neuro/ Feet: Does not have known diabetic peripheral neuropathy .   Renal: Patient does not have known baseline CKD.    3) Hyperthyroidism:  - She is clinically euthyroid  - Discussed D/D pregnancy related hyperthyroidism, and graves' disease - The goal of treatment is to maintain persistent but mild hyperthyroidism in the mother in an attempt to prevent fetal hypothyroidism since the fetal thyroid is more sensitive to the action of thionamide therapy. Overtreatment of maternal hyperthyroidism can cause fetal goiter and primary hypothyroidism.  - No indication to treat at this time , will continue to monitor     F/U in 2 months    Signed electronically by: Mack Guise, MD  [facility] Endocrinology  [facility] [facility] [address] [place], [place] [address] Phone: [phone] FAX: [phone]   CC: Tamara Penny, NP [place] [address] Phone: [phone]  Fax: [phone]  Return to Endocrinology clinic as below: Future Appointments  Date Time Provider [place]  11/22/2020  3:40 PM Jimy Gates, Melanie Crazier, MD [facility] None  11/23/2020 12:45 PM [facility] NURSE [facility]  11/23/2020  1:00 PM [facility] US1 [facility]  12/06/2020  3:55 PM Chancy Milroy, MD [facility]  12/08/2020  3:30 PM Desenglau, Tommy Rainwater, PT [facility]  12/29/2020  2:00 PM Dory Horn, LCSW [facility] None

## 2020-11-22 NOTE — Patient Instructions (Signed)
-   Keep up the Good Work ! - Try and take Novolin-N 13 units daily but as discussed in the past, this works better is taken twice a day as one dose lasts between 12-18 hours and won;t cover the full 24 hour of a day       HOW TO TREAT LOW BLOOD SUGARS (Blood sugar LESS THAN 70 MG/DL)  Please follow the RULE OF 15 for the treatment of hypoglycemia treatment (when your (blood sugars are less than 70 mg/dL)    STEP 1: Take 15 grams of carbohydrates when your blood sugar is low, which includes:   3-4 GLUCOSE TABS  OR  3-4 OZ OF JUICE OR REGULAR SODA OR  ONE TUBE OF GLUCOSE GEL     STEP 2: RECHECK blood sugar in 15 MINUTES STEP 3: If your blood sugar is still low at the 15 minute recheck --> then, go back to STEP 1 and treat AGAIN with another 15 grams of carbohydrates.

## 2020-11-23 ENCOUNTER — Other Ambulatory Visit: Payer: Self-pay | Admitting: *Deleted

## 2020-11-23 ENCOUNTER — Ambulatory Visit: Payer: Medicaid Other | Attending: Obstetrics

## 2020-11-23 ENCOUNTER — Telehealth: Payer: Self-pay | Admitting: Registered"

## 2020-11-23 ENCOUNTER — Ambulatory Visit: Payer: Medicaid Other | Admitting: *Deleted

## 2020-11-23 ENCOUNTER — Encounter: Payer: Self-pay | Admitting: *Deleted

## 2020-11-23 VITALS — BP 124/72 | HR 83

## 2020-11-23 DIAGNOSIS — O321XX0 Maternal care for breech presentation, not applicable or unspecified: Secondary | ICD-10-CM | POA: Diagnosis not present

## 2020-11-23 DIAGNOSIS — E119 Type 2 diabetes mellitus without complications: Secondary | ICD-10-CM

## 2020-11-23 DIAGNOSIS — O10019 Pre-existing essential hypertension complicating pregnancy, unspecified trimester: Secondary | ICD-10-CM | POA: Diagnosis not present

## 2020-11-23 DIAGNOSIS — O10012 Pre-existing essential hypertension complicating pregnancy, second trimester: Secondary | ICD-10-CM | POA: Diagnosis not present

## 2020-11-23 DIAGNOSIS — I10 Essential (primary) hypertension: Secondary | ICD-10-CM | POA: Diagnosis not present

## 2020-11-23 DIAGNOSIS — O10919 Unspecified pre-existing hypertension complicating pregnancy, unspecified trimester: Secondary | ICD-10-CM

## 2020-11-23 DIAGNOSIS — Z3A19 19 weeks gestation of pregnancy: Secondary | ICD-10-CM | POA: Diagnosis not present

## 2020-11-23 DIAGNOSIS — O99213 Obesity complicating pregnancy, third trimester: Secondary | ICD-10-CM | POA: Diagnosis not present

## 2020-11-23 DIAGNOSIS — O3412 Maternal care for benign tumor of corpus uteri, second trimester: Secondary | ICD-10-CM | POA: Diagnosis not present

## 2020-11-23 DIAGNOSIS — Z3689 Encounter for other specified antenatal screening: Secondary | ICD-10-CM | POA: Diagnosis not present

## 2020-11-23 DIAGNOSIS — Z148 Genetic carrier of other disease: Secondary | ICD-10-CM | POA: Diagnosis not present

## 2020-11-23 DIAGNOSIS — D259 Leiomyoma of uterus, unspecified: Secondary | ICD-10-CM | POA: Diagnosis not present

## 2020-11-23 DIAGNOSIS — O24112 Pre-existing diabetes mellitus, type 2, in pregnancy, second trimester: Secondary | ICD-10-CM | POA: Insufficient documentation

## 2020-11-23 DIAGNOSIS — Z363 Encounter for antenatal screening for malformations: Secondary | ICD-10-CM

## 2020-11-23 NOTE — Telephone Encounter (Signed)
RD was asked to follow-up with patient regarding status of CGM approval.   Patient states her endocrinologist sent Rx for Dexcom yesterday and told her she would follow-up with pre-auth if the Dexcom wasn't approved through her Medicaid. Patient agreed to come in for appointment with me on 11/25/20 at 4:15 for Dexcom training. RD will use sample Dexcom if she does not have Rx approved before then.

## 2020-11-25 ENCOUNTER — Other Ambulatory Visit: Payer: Medicaid Other

## 2020-11-25 NOTE — Progress Notes (Deleted)
Dexcom G6 Personal CGM Training Start time:***    End time: *** Total time: *** @NAME @ was educated about the following:  -Getting to know device    (Receiver:Phone programmed ) -Setting up device (high alert  ***  , low alert ***  ) -Rise alert set at   *** mg/dL  -Fall alert set at    ***  mg/dL  -Setting alert profile -Inserting sensor (        WNL) -Calibrating- none required for G6 -Ending sensor session -Trouble shooting -Tape guide, clarity information  -Reviewed insulin dosing from dexcom.   Patient has [facility] tech support and my contact information.

## 2020-11-26 NOTE — Progress Notes (Signed)
Patient was assessed and managed by nursing staff during this encounter. I have reviewed the chart and agree with the documentation and plan. I have also made any necessary editorial changes.  Aletha Halim, MD 11/26/2020 7:00 AM

## 2020-12-06 ENCOUNTER — Encounter: Payer: Medicaid Other | Admitting: Obstetrics and Gynecology

## 2020-12-08 ENCOUNTER — Other Ambulatory Visit: Payer: Self-pay

## 2020-12-08 ENCOUNTER — Ambulatory Visit: Payer: Medicaid Other | Attending: Obstetrics & Gynecology | Admitting: Physical Therapy

## 2020-12-08 ENCOUNTER — Encounter: Payer: Self-pay | Admitting: Physical Therapy

## 2020-12-08 DIAGNOSIS — G8929 Other chronic pain: Secondary | ICD-10-CM | POA: Insufficient documentation

## 2020-12-08 DIAGNOSIS — M6281 Muscle weakness (generalized): Secondary | ICD-10-CM | POA: Diagnosis not present

## 2020-12-08 DIAGNOSIS — M545 Low back pain, unspecified: Secondary | ICD-10-CM | POA: Diagnosis not present

## 2020-12-08 NOTE — Addendum Note (Signed)
Addended by: Su Hoff on: 12/08/2020 05:12 PM   Modules accepted: Orders

## 2020-12-08 NOTE — Therapy (Signed)
[facility] [address] [place], [place], [address] Phone: [phone]   Fax:  [phone]  Physical Therapy Evaluation  Patient Details  Name: Tamara Reyes MRN: [id number] Date of Birth: [birth year] Referring Provider (PT): Woodroe Mode, MD   Encounter Date: 12/08/2020   PT End of Session - 12/08/20 1552    Visit Number 1    Date for PT Re-Evaluation 03/02/21    Authorization Type medicaid prep    PT Start Time 1533    PT Stop Time 1615    PT Time Calculation (min) 42 min    Activity Tolerance Patient tolerated treatment well    Behavior During Therapy [facility] for tasks assessed/performed           Past Medical History:  Diagnosis Date  . Asthma   . COVID-19 08/16/2020  . DM (diabetes mellitus), type 2 ([place])   . Fibromyalgia   . Genital herpes   . History of marijuana use 2021  . Hypercholesteremia   . Hypertension   . Nexplanon insertion 08/26/2013   Lot #[id number] Exp 02/2016   . PCOS (polycystic ovarian syndrome)     Past Surgical History:  Procedure Laterality Date  . abscess removal     . TONSILLECTOMY    . TONSILLECTOMY AND ADENOIDECTOMY     tonsils only    There were no vitals filed for this visit.    Subjective Assessment - 12/08/20 1541    Subjective Pt states she gets pain in the lower abdomen and low back.  Pain is every day.  Pt gets muscle spasms in her legs.  Pt states she has a fibroid and round ligament strain.    Pertinent History hypertension during pregnancy, type 2 DM, PCOS, fibro    Patient Stated Goals just less pain    Currently in Pain? Yes    Pain Score 10-Worst pain ever   4-5/10 on typical day   Pain Orientation Lower    Pain Descriptors / Indicators Sharp;Aching    Pain Type Chronic pain    Pain Frequency Constant    Aggravating Factors  weather, anxiety, fibromyalgia flare    Pain Relieving Factors deep breathing, yoga, baths              [facility] PT  Assessment - 12/08/20 0001      Assessment   Medical Diagnosis O09.90 (ICD-10-CM) - Supervision of high risk pregnancy, antepartum    Referring Provider (PT) Woodroe Mode, MD    Prior Therapy NO      Precautions   Precautions None      Balance Screen   Has the patient fallen in the past 6 months No      [place] residence    Living Arrangements Spouse/significant other      Prior Function   Level of Independence Independent    Vocation Full time employment    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      Cognition   Overall Cognitive Status Within Functional Limits for tasks assessed      Functional Tests   Functional tests Single leg stance      Single Leg Stance   Comments trendelenburg      Posture/Postural Control   Posture/Postural Control Postural limitations    Posture Comments pregnancy posture - mild      ROM / Strength   AROM / PROM / Strength AROM;PROM;Strength  AROM   Overall AROM Comments lumbar flexion [facility]      Strength   Overall Strength Comments Lt hip 4-/5 +pain; Rt hip flex and ext 4/5 +pain      Palpation   Palpation comment TTP Rt rectus abdominus and paraspinals Rt side                      Objective measurements completed on examination: See above findings.     Pelvic Floor Special Questions - 12/08/20 0001    Prior Pelvic/Prostate Exam Yes    Are you Pregnant or attempting pregnancy? Yes    Prior Pregnancies No    Currently Sexually Active No    Urinary Leakage Yes    How often a few times/day    Pad use no    Activities that cause leaking Coughing;Sneezing;Laughing    Urinary frequency 2-3/hour    Fecal incontinence --   constipation   Fluid intake 3-4 16 oz    Skin Integrity Intact    Pelvic Floor Internal Exam pt identity confirmed and internal assessment performed    Exam Type Vaginal    Palpation severe TTP Lt levators throughout    Strength  fair squeeze, definite lift    Strength # of reps 2   4 quick then cramps on Rt side   Strength # of seconds 2    Tone high Lt>Rt                         PT Long Term Goals - 12/08/20 1551      PT LONG TERM GOAL #1   Title Pt will report 25% less pain in typical day    Time 12    Period Weeks    Status New    Target Date 03/02/21      PT LONG TERM GOAL #2   Title Pt will be able to be active and have a plan she can do even on days that she is having more pain    Baseline fibromyalgia causes her to just lie in bed some days    Time 12    Period Weeks    Status New    Target Date 03/02/21      PT LONG TERM GOAL #3   Title Pt will be ind with advanced HEP as part of a healthy pregnancy    Time 12    Period Weeks    Status New    Target Date 03/02/21      PT LONG TERM GOAL #4   Title Pt will have 5/5 bilateral hip strength for improved pelvic stability during functional activities at her job that 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    Baseline 4-/5 +pain    Time 12    Period Weeks    Status New    Target Date 03/02/21                  Plan - 12/08/20 1637    Clinical Impression Statement Pt presents to skilled PT with history of pain and complications during pregnancy. Pt has assessment significant for hip Lt side weakness4-/5 with pain throughout and Rt weakness of 4/5 hip abduction.  Pt is tight and TTP Rt rectus abdominus, Rt lumbar extensors.  Upon assessing the pelvic floor she only has a 2 sec hold with 3/5 MMT.  Pt has 4 quick flicks, then had cramp  then did 1 more.  Her pelvic floor levator muscles are very tight TTP on Lt side.  Pt is getting urgency, frequency, and bladder leakage as well as severe pain in groin and low back.  pt will benefit from skilled PT due to address impairments mentioned here and above in order to allow her to manage pain so she can exercise as part of a healthy pregnancy.    Personal Factors and  Comorbidities Comorbidity 3+    Comorbidities hypertension during pregnancy, type 2 DM, PCOS, fibromyalgia    Examination-Activity Limitations Toileting;Continence    Examination-Participation Restrictions Occupation;Community Activity    Stability/Clinical Decision Making Evolving/Moderate complexity    Clinical Decision Making Moderate    Rehab Potential Excellent    PT Frequency 1x / week    PT Duration 12 weeks    PT Treatment/Interventions ADLs/Self Care Home Management;Biofeedback;Cryotherapy;Electrical Stimulation;Neuromuscular re-education;Therapeutic exercise;Therapeutic activities;Patient/family education;Manual techniques;Dry needling;Passive range of motion;Taping    PT Next Visit Plan urge, internal STM to levators Lt side, MFR abdomen and lumbar; stretches and breathing techniques    Consulted and Agree with Plan of Care Patient           Patient will benefit from skilled therapeutic intervention in order to improve the following deficits and impairments:  Abnormal gait,Pain,Increased fascial restricitons,Postural dysfunction,Decreased strength,Impaired flexibility  Visit Diagnosis: Chronic low back pain, unspecified back pain laterality, unspecified whether sciatica present  Muscle weakness (generalized)     Problem List Patient Active Problem List   Diagnosis Date Noted  . 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  . Anemia during pregnancy in second trimester 11/18/2020  . History of COVID-19 11/18/2020  . H/O fibromyalgia 10/21/2020  . Alpha thalassemia silent carrier 10/20/2020  . Hyperthyroidism 10/18/2020  . Supervision of high risk pregnancy, antepartum 09/22/2020  . Pre-existing type 2 diabetes mellitus in pregnancy 01/07/2014  . Hyperlipidemia 10/19/2011  . Chronic hypertension during pregnancy, antepartum 10/19/2011    Jule Ser, PT 12/08/2020, 5:04 PM  [facility] [address], [place], [place], [address] Phone: [phone]   Fax:  [phone]  Name: Tamara Reyes MRN: [id number] Date of Birth: 01/04/1996

## 2020-12-22 ENCOUNTER — Ambulatory Visit: Payer: Medicaid Other | Admitting: *Deleted

## 2020-12-22 ENCOUNTER — Ambulatory Visit: Payer: Medicaid Other | Attending: Obstetrics

## 2020-12-22 ENCOUNTER — Encounter: Payer: Self-pay | Admitting: *Deleted

## 2020-12-22 ENCOUNTER — Other Ambulatory Visit: Payer: Self-pay

## 2020-12-22 VITALS — BP 136/76 | HR 83

## 2020-12-22 DIAGNOSIS — O3412 Maternal care for benign tumor of corpus uteri, second trimester: Secondary | ICD-10-CM

## 2020-12-22 DIAGNOSIS — O24112 Pre-existing diabetes mellitus, type 2, in pregnancy, second trimester: Secondary | ICD-10-CM | POA: Diagnosis not present

## 2020-12-22 DIAGNOSIS — Z148 Genetic carrier of other disease: Secondary | ICD-10-CM

## 2020-12-22 DIAGNOSIS — O321XX0 Maternal care for breech presentation, not applicable or unspecified: Secondary | ICD-10-CM | POA: Diagnosis not present

## 2020-12-22 DIAGNOSIS — Z3A23 23 weeks gestation of pregnancy: Secondary | ICD-10-CM | POA: Diagnosis not present

## 2020-12-22 DIAGNOSIS — O10012 Pre-existing essential hypertension complicating pregnancy, second trimester: Secondary | ICD-10-CM | POA: Diagnosis not present

## 2020-12-22 DIAGNOSIS — Z363 Encounter for antenatal screening for malformations: Secondary | ICD-10-CM | POA: Diagnosis not present

## 2020-12-22 DIAGNOSIS — O10919 Unspecified pre-existing hypertension complicating pregnancy, unspecified trimester: Secondary | ICD-10-CM | POA: Insufficient documentation

## 2020-12-22 DIAGNOSIS — D259 Leiomyoma of uterus, unspecified: Secondary | ICD-10-CM

## 2020-12-23 ENCOUNTER — Other Ambulatory Visit: Payer: Self-pay | Admitting: *Deleted

## 2020-12-23 DIAGNOSIS — O10912 Unspecified pre-existing hypertension complicating pregnancy, second trimester: Secondary | ICD-10-CM

## 2020-12-28 ENCOUNTER — Other Ambulatory Visit: Payer: Self-pay

## 2020-12-28 ENCOUNTER — Encounter (HOSPITAL_COMMUNITY): Payer: Self-pay | Admitting: Obstetrics & Gynecology

## 2020-12-28 ENCOUNTER — Inpatient Hospital Stay (HOSPITAL_COMMUNITY)
Admission: AD | Admit: 2020-12-28 | Discharge: 2020-12-28 | Disposition: A | Discharge status: Home / Self Care | Payer: Medicaid Other | Attending: Obstetrics & Gynecology | Admitting: Obstetrics & Gynecology

## 2020-12-28 DIAGNOSIS — R102 Pelvic and perineal pain: Secondary | ICD-10-CM | POA: Insufficient documentation

## 2020-12-28 DIAGNOSIS — F1290 Cannabis use, unspecified, uncomplicated: Secondary | ICD-10-CM | POA: Diagnosis not present

## 2020-12-28 DIAGNOSIS — R109 Unspecified abdominal pain: Secondary | ICD-10-CM | POA: Diagnosis not present

## 2020-12-28 DIAGNOSIS — O26892 Other specified pregnancy related conditions, second trimester: Secondary | ICD-10-CM | POA: Insufficient documentation

## 2020-12-28 DIAGNOSIS — Z3A24 24 weeks gestation of pregnancy: Secondary | ICD-10-CM | POA: Diagnosis not present

## 2020-12-28 DIAGNOSIS — Z87891 Personal history of nicotine dependence: Secondary | ICD-10-CM | POA: Diagnosis not present

## 2020-12-28 DIAGNOSIS — O26899 Other specified pregnancy related conditions, unspecified trimester: Secondary | ICD-10-CM

## 2020-12-28 DIAGNOSIS — O99322 Drug use complicating pregnancy, second trimester: Secondary | ICD-10-CM

## 2020-12-28 DIAGNOSIS — Z79899 Other long term (current) drug therapy: Secondary | ICD-10-CM | POA: Diagnosis not present

## 2020-12-28 LAB — RAPID URINE DRUG SCREEN, HOSP PERFORMED
Amphetamines: NOT DETECTED
Barbiturates: NOT DETECTED
Benzodiazepines: NOT DETECTED
Cocaine: NOT DETECTED
Opiates: NOT DETECTED
Tetrahydrocannabinol: POSITIVE — AB

## 2020-12-28 LAB — URINALYSIS, ROUTINE W REFLEX MICROSCOPIC
Bilirubin Urine: NEGATIVE
Glucose, UA: NEGATIVE mg/dL
Hgb urine dipstick: NEGATIVE
Ketones, ur: NEGATIVE mg/dL
Leukocytes,Ua: NEGATIVE
Nitrite: NEGATIVE
Protein, ur: NEGATIVE mg/dL
Specific Gravity, Urine: 1.014 (ref 1.005–1.030)
pH: 7 (ref 5.0–8.0)

## 2020-12-28 LAB — WET PREP, GENITAL
Clue Cells Wet Prep HPF POC: NONE SEEN
Sperm: NONE SEEN
Trich, Wet Prep: NONE SEEN
Yeast Wet Prep HPF POC: NONE SEEN

## 2020-12-28 MED ORDER — CYCLOBENZAPRINE HCL 10 MG PO TABS
10.0000 mg | ORAL_TABLET | Freq: Two times a day (BID) | ORAL | 0 refills | Status: DC | PRN
  Filled 2020-12-28 – 2021-02-01 (×2): qty 20, 10d supply, fill #0

## 2020-12-28 MED ORDER — ACETAMINOPHEN 500 MG PO TABS
1000.0000 mg | ORAL_TABLET | Freq: Once | ORAL | Status: AC
  Administered 2020-12-28: 1000 mg via ORAL
  Filled 2020-12-28: qty 2

## 2020-12-28 MED ORDER — CYCLOBENZAPRINE HCL 5 MG PO TABS
10.0000 mg | ORAL_TABLET | Freq: Once | ORAL | Status: AC
  Administered 2020-12-28: 10 mg via ORAL
  Filled 2020-12-28: qty 2

## 2020-12-28 NOTE — MAU Note (Signed)
Presents with c/o constant sharp lower abdominal pain that's between umbilicus and pelvis for past 2 days.  Denies VB or LOF.  Endorses +FM.

## 2020-12-28 NOTE — Discharge Instructions (Signed)
Abdominal Pain During Pregnancy Abdominal pain is common during pregnancy and has many possible causes. Some causes are more serious than others, and sometimes the cause is not known. Abdominal pain can be a sign that labor is starting. It can also be caused by normal growth of your baby causing stretching of muscles and ligaments during pregnancy. Always tell your health care provider if you have any abdominal pain. Follow these instructions at home:  Do not have sex or put anything in your vagina until your pain goes away completely.  Get plenty of rest until your pain improves.  Drink enough fluid to keep your urine pale yellow.  Take over-the-counter and prescription medicines only as told by your health care provider.  Keep all follow-up visits. This is important.   Contact a health care provider if:  Your pain continues or gets worse after resting.  You have lower abdominal pain that: ? Comes and goes at regular intervals. ? Spreads to your back. ? Is similar to menstrual cramps.  You have pain or burning when you urinate. Get help right away if:  You have a fever, chills, or shortness of breath.  You have vaginal bleeding.  You are leaking fluid or passing tissue from your vagina.  You have vomiting or diarrhea that lasts for more than 24 hours.  Your baby is moving less than usual.  You feel very weak or faint.  You develop severe pain in your upper abdomen. Summary  Abdominal pain is common during pregnancy and has many possible causes.  If you experience abdominal pain during pregnancy, tell your health care provider right away.  Follow your health care provider's home care instructions and keep all follow-up visits as told. This information is not intended to replace advice given to you by your health care provider. Make sure you discuss any questions you have with your health care provider. Document Revised: 04/13/2020 Document Reviewed: 04/13/2020 Elsevier  Patient Education  2021 [employer].

## 2020-12-28 NOTE — MAU Provider Note (Addendum)
History     CSN: [id number]  Arrival date and time: 12/28/20 1154   Event Date/Time   First Provider Initiated Contact with Patient 12/28/20 1246      Chief Complaint  Patient presents with  . Abdominal Pain   HPI Tamara Reyes is a 25 y.o. G1P0000 at [redacted]w[redacted]d who presents to MAU with chief complaint of lower abdominal pain. This is a new problem, onset two days ago. Patient's pain is suprapubic. Pain score is 8/10. She denies aggravating or alleviating factors. She has not taken medication or tried other treatments for this complaint.  Patient is somnolent on arrival to MAU and during CNM initial assessment. Appropriate responses to questions and A&O x 4 when prompted.  She denies vaginal bleeding, leaking of fluid, decreased fetal movement, fever, falls, or recent illness. She receives care with [facility].   OB History    Gravida  1   Para  0   Term  0   Preterm  0   AB  0   Living  0     SAB  0   IAB  0   Ectopic  0   Multiple  0   Live Births              Past Medical History:  Diagnosis Date  . Asthma   . COVID-19 08/16/2020  . DM (diabetes mellitus), type 2 ([place])   . Fibromyalgia   . Genital herpes   . History of marijuana use 2021  . Hypercholesteremia   . Hypertension   . Nexplanon insertion 08/26/2013   Lot #[id number] Exp 02/2016   . PCOS (polycystic ovarian syndrome)     Past Surgical History:  Procedure Laterality Date  . abscess removal     . TONSILLECTOMY    . TONSILLECTOMY AND ADENOIDECTOMY     tonsils only    Family History  Problem Relation Age of Onset  . Hypertension Mother   . Diabetes Mother   . Hypertension Maternal Grandmother   . Hypertension Maternal Grandfather   . Hypertension Paternal Grandmother   . Diabetes Paternal Grandmother     Social History   Tobacco Use  . Smoking status: Former Smoker    Types: Cigarettes    Quit date: 05/28/2014    Years since quitting: 6.5  . Smokeless tobacco: Never Used  Vaping  Use  . Vaping Use: Never used  Substance Use Topics  . Alcohol use: No  . Drug use: Not Currently    Types: Marijuana    Comment: stopped once pregnant    Allergies: No Known Allergies  Medications Prior to Admission  Medication Sig Dispense Refill Last Dose  . acetaminophen (TYLENOL) 500 MG tablet Take 500 mg by mouth every 6 (six) hours as needed.   12/27/2020 at Unknown time  . hydrOXYzine (ATARAX/VISTARIL) 25 MG tablet TAKE 1 TABLET (25 MG TOTAL) BY MOUTH AT BEDTIME. 30 tablet 1 12/27/2020 at Unknown time  . insulin NPH Human (NOVOLIN N) 100 UNIT/ML injection INJECT 0.13 MLS (13 UNITS TOTAL) INTO THE SKIN DAILY WITH BREAKFAST. 10 mL 3 12/27/2020 at Unknown time  . omeprazole (PRILOSEC) 20 MG capsule TAKE 1 CAPSULE (20 MG TOTAL) BY MOUTH IN THE MORNING AND AT BEDTIME. 30 capsule 3 12/28/2020 at Unknown time  . polyethylene glycol (MIRALAX / GLYCOLAX) 17 g packet Take 17 g by mouth daily.   12/28/2020 at Unknown time  . Prenatal Vit-Fe Fumarate-FA (PRENATAL MULTIVITAMIN) TABS tablet Take 1 tablet by mouth daily  at 12 noon.   12/28/2020 at Unknown time  . sertraline (ZOLOFT) 50 MG tablet TAKE 1 TABLET (50 MG TOTAL) BY MOUTH DAILY. 30 tablet 5 12/28/2020 at Unknown time  . valACYclovir (VALTREX) 500 MG tablet Take 1 tablet (500 mg total) by mouth daily. 30 tablet 12 12/28/2020 at Unknown time  . Continuous Blood Gluc Sensor (DEXCOM G6 SENSOR) MISC 1 Device by Does not apply route as directed. 3 each 33   . Continuous Blood Gluc Transmit (DEXCOM G6 TRANSMITTER) MISC 1 Device by Does not apply route as directed. 1 each 3   . Insulin Pen Needle (PEN NEEDLES) 31G X 8 MM MISC Inject 1 application into the skin at bedtime. 100 each 3   . promethazine (PHENERGAN) 25 MG tablet Take 1 tablet (25 mg total) by mouth at bedtime as needed for nausea or vomiting. 30 tablet 2     Review of Systems  Gastrointestinal: Positive for abdominal pain.  All other systems reviewed and are negative.  Physical Exam    Blood pressure (!) 108/55, pulse 70, temperature 98 F (36.7 C), temperature source Oral, resp. rate 16, height 5\' 8"  (1.727 m), weight 84.5 kg, last menstrual period 07/09/2020, SpO2 100 %.  Physical Exam Vitals and nursing note reviewed. Exam conducted with a chaperone present.  Constitutional:      General: She is not in acute distress.    Appearance: She is well-developed. She is not ill-appearing.  Cardiovascular:     Rate and Rhythm: Normal rate and regular rhythm.     Heart sounds: Normal heart sounds.  Pulmonary:     Effort: Pulmonary effort is normal.     Breath sounds: Normal breath sounds.  Abdominal:     Palpations: Abdomen is soft.     Tenderness: There is no abdominal tenderness. There is no right CVA tenderness or left CVA tenderness.  Skin:    Capillary Refill: Capillary refill takes less than 2 seconds.  Neurological:     Mental Status: She is alert and oriented to person, place, and time.  Psychiatric:        Behavior: Behavior normal.     MAU Course  Procedures  --Normal cervical length on MFM scan 12/22/2020 --Patient sleeping 45 minutes after Tylenol. Woke up when RN entered room and reported pain score 5/10.  --Reassuring fetal tracing: baseline 150, mod var, + accels, no decels --Toco: quiet  Orders Placed This Encounter  Procedures  . Wet prep, genital  . Urinalysis, Routine w reflex microscopic Urine, Clean Catch  . Rapid urine drug screen (hospital performed)  . Nursing communication  . Discharge patient   Patient Vitals for the past 24 hrs:  BP Temp Temp src Pulse Resp SpO2 Height Weight  12/28/20 1402 (!) 108/55 -- -- 70 16 -- -- --  12/28/20 1400 -- -- -- -- -- 100 % -- --  12/28/20 1355 -- -- -- -- -- 100 % -- --  12/28/20 1350 -- -- -- -- -- 100 % -- --  12/28/20 1345 -- -- -- -- -- 100 % -- --  12/28/20 1340 -- -- -- -- -- 100 % -- --  12/28/20 1335 -- -- -- -- -- 100 % -- --  12/28/20 1315 -- -- -- -- -- 100 % -- --  12/28/20  1305 -- -- -- -- -- 99 % -- --  12/28/20 1300 -- -- -- -- -- 99 % -- --  12/28/20 1255 -- -- -- -- -- 99 % -- --  12/28/20 1250 -- -- -- -- -- 99 % -- --  12/28/20 1245 -- -- -- -- -- 100 % -- --  12/28/20 1237 122/73 -- -- 80 -- -- -- --  12/28/20 1210 123/72 98 F (36.7 C) Oral 85 18 100 % -- --  12/28/20 1204 -- -- -- -- -- -- 5\' 8"  (1.727 m) 84.5 kg   Results for orders placed or performed during the hospital encounter of 12/28/20 (from the past 24 hour(s))  Urinalysis, Routine w reflex microscopic Urine, Clean Catch     Status: None   Collection Time: 12/28/20 12:26 PM  Result Value Ref Range   Color, Urine YELLOW YELLOW   APPearance CLEAR CLEAR   Specific Gravity, Urine 1.014 1.005 - 1.030   pH 7.0 5.0 - 8.0   Glucose, UA NEGATIVE NEGATIVE mg/dL   Hgb urine dipstick NEGATIVE NEGATIVE   Bilirubin Urine NEGATIVE NEGATIVE   Ketones, ur NEGATIVE NEGATIVE mg/dL   Protein, ur NEGATIVE NEGATIVE mg/dL   Nitrite NEGATIVE NEGATIVE   Leukocytes,Ua NEGATIVE NEGATIVE  Rapid urine drug screen (hospital performed)     Status: Abnormal   Collection Time: 12/28/20 12:26 PM  Result Value Ref Range   Opiates NONE DETECTED NONE DETECTED   Cocaine NONE DETECTED NONE DETECTED   Benzodiazepines NONE DETECTED NONE DETECTED   Amphetamines NONE DETECTED NONE DETECTED   Tetrahydrocannabinol POSITIVE (A) NONE DETECTED   Barbiturates NONE DETECTED NONE DETECTED  Wet prep, genital     Status: Abnormal   Collection Time: 12/28/20  1:19 PM   Specimen: Vaginal  Result Value Ref Range   Yeast Wet Prep HPF POC NONE SEEN NONE SEEN   Trich, Wet Prep NONE SEEN NONE SEEN   Clue Cells Wet Prep HPF POC NONE SEEN NONE SEEN   WBC, Wet Prep HPF POC FEW (A) NONE SEEN   Sperm NONE SEEN    Assessment and Plan  --25 y.o. G1P0000 at [redacted]w[redacted]d  --Reassuring fetal tracing for gestational age --THC + --Pain managed by PO Tylenol, continue PRN --Discharge home in stable condition  Darlina Rumpf,  CNM 12/28/2020, 7:30 PM

## 2020-12-29 ENCOUNTER — Ambulatory Visit (HOSPITAL_COMMUNITY): Payer: Self-pay | Admitting: Licensed Clinical Social Worker

## 2020-12-29 LAB — GC/CHLAMYDIA PROBE AMP (~~LOC~~) NOT AT ARMC
Chlamydia: NEGATIVE
Comment: NEGATIVE
Comment: NORMAL
Neisseria Gonorrhea: NEGATIVE

## 2021-01-04 ENCOUNTER — Other Ambulatory Visit: Payer: Self-pay

## 2021-01-12 DIAGNOSIS — Z3403 Encounter for supervision of normal first pregnancy, third trimester: Secondary | ICD-10-CM | POA: Diagnosis not present

## 2021-01-18 ENCOUNTER — Encounter: Payer: Self-pay | Admitting: Family Medicine

## 2021-01-18 ENCOUNTER — Encounter: Payer: Medicaid Other | Attending: Certified Nurse Midwife | Admitting: Registered"

## 2021-01-18 ENCOUNTER — Other Ambulatory Visit: Payer: Self-pay

## 2021-01-18 ENCOUNTER — Ambulatory Visit (INDEPENDENT_AMBULATORY_CARE_PROVIDER_SITE_OTHER): Payer: Medicaid Other | Admitting: Family Medicine

## 2021-01-18 ENCOUNTER — Ambulatory Visit: Payer: Medicaid Other | Admitting: Registered"

## 2021-01-18 VITALS — BP 126/75 | HR 89 | Wt 188.9 lb

## 2021-01-18 DIAGNOSIS — O24119 Pre-existing diabetes mellitus, type 2, in pregnancy, unspecified trimester: Secondary | ICD-10-CM

## 2021-01-18 DIAGNOSIS — O0990 Supervision of high risk pregnancy, unspecified, unspecified trimester: Secondary | ICD-10-CM | POA: Diagnosis not present

## 2021-01-18 DIAGNOSIS — O24113 Pre-existing diabetes mellitus, type 2, in pregnancy, third trimester: Secondary | ICD-10-CM

## 2021-01-18 DIAGNOSIS — O10919 Unspecified pre-existing hypertension complicating pregnancy, unspecified trimester: Secondary | ICD-10-CM

## 2021-01-18 DIAGNOSIS — Z23 Encounter for immunization: Secondary | ICD-10-CM | POA: Diagnosis not present

## 2021-01-18 DIAGNOSIS — E0590 Thyrotoxicosis, unspecified without thyrotoxic crisis or storm: Secondary | ICD-10-CM | POA: Diagnosis not present

## 2021-01-18 DIAGNOSIS — B009 Herpesviral infection, unspecified: Secondary | ICD-10-CM

## 2021-01-18 MED ORDER — DEXCOM G6 RECEIVER DEVI
1.0000 | 0 refills | Status: DC | PRN
  Filled 2021-01-18: qty 1, 30d supply, fill #0
  Filled 2021-02-01: qty 1, 365d supply, fill #0

## 2021-01-18 NOTE — Patient Instructions (Signed)
Contraception Choices Contraception, also called birth control, refers to methods or devices that prevent pregnancy. Hormonal methods Contraceptive implant A contraceptive implant is a thin, plastic tube that contains a hormone that prevents pregnancy. It is different from an intrauterine device (IUD). It is inserted into the upper part of the arm by a health care provider. Implants can be effective for up to 3 years. Progestin-only injections Progestin-only injections are injections of progestin, a synthetic form of the hormone progesterone. They are given every 3 months by a health care provider. Birth control pills Birth control pills are pills that contain hormones that prevent pregnancy. They must be taken once a day, preferably at the same time each day. A prescription is needed to use this method of contraception. Birth control patch The birth control patch contains hormones that prevent pregnancy. It is placed on the skin and must be changed once a week for three weeks and removed on the fourth week. A prescription is needed to use this method of contraception. Vaginal ring A vaginal ring contains hormones that prevent pregnancy. It is placed in the vagina for three weeks and removed on the fourth week. After that, the process is repeated with a new ring. A prescription is needed to use this method of contraception. Emergency contraceptive Emergency contraceptives prevent pregnancy after unprotected sex. They come in pill form and can be taken up to 5 days after sex. They work best the sooner they are taken after having sex. Most emergency contraceptives are available without a prescription. This method should not be used as your only form of birth control.   Barrier methods Female condom A female condom is a thin sheath that is worn over the penis during sex. Condoms keep sperm from going inside a woman's body. They can be used with a sperm-killing substance (spermicide) to increase their  effectiveness. They should be thrown away after one use. Female condom A female condom is a soft, loose-fitting sheath that is put into the vagina before sex. The condom keeps sperm from going inside a woman's body. They should be thrown away after one use. Diaphragm A diaphragm is a soft, dome-shaped barrier. It is inserted into the vagina before sex, along with a spermicide. The diaphragm blocks sperm from entering the uterus, and the spermicide kills sperm. A diaphragm should be left in the vagina for 6-8 hours after sex and removed within 24 hours. A diaphragm is prescribed and fitted by a health care provider. A diaphragm should be replaced every 1-2 years, after giving birth, after gaining more than 15 lb (6.8 kg), and after pelvic surgery. Cervical cap A cervical cap is a round, soft latex or plastic cup that fits over the cervix. It is inserted into the vagina before sex, along with spermicide. It blocks sperm from entering the uterus. The cap should be left in place for 6-8 hours after sex and removed within 48 hours. A cervical cap must be prescribed and fitted by a health care provider. It should be replaced every 2 years. Sponge A sponge is a soft, circular piece of polyurethane foam with spermicide in it. The sponge helps block sperm from entering the uterus, and the spermicide kills sperm. To use it, you make it wet and then insert it into the vagina. It should be inserted before sex, left in for at least 6 hours after sex, and removed and thrown away within 30 hours. Spermicides Spermicides are chemicals that kill or block sperm from entering the  cervix and uterus. They can come as a cream, jelly, suppository, foam, or tablet. A spermicide should be inserted into the vagina with an applicator at least 35-36 minutes before sex to allow time for it to work. The process must be repeated every time you have sex. Spermicides do not require a prescription.   Intrauterine  contraception Intrauterine device (IUD) An IUD is a T-shaped device that is put in a woman's uterus. There are two types:  Hormone IUD.This type contains progestin, a synthetic form of the hormone progesterone. This type can stay in place for 3-5 years.  Copper IUD.This type is wrapped in copper wire. It can stay in place for 10 years. Permanent methods of contraception Female tubal ligation In this method, a woman's fallopian tubes are sealed, tied, or blocked during surgery to prevent eggs from traveling to the uterus. Hysteroscopic sterilization In this method, a small, flexible insert is placed into each fallopian tube. The inserts cause scar tissue to form in the fallopian tubes and block them, so sperm cannot reach an egg. The procedure takes about 3 months to be effective. Another form of birth control must be used during those 3 months. Female sterilization This is a procedure to tie off the tubes that carry sperm (vasectomy). After the procedure, the man can still ejaculate fluid (semen). Another form of birth control must be used for 3 months after the procedure. Natural planning methods Natural family planning In this method, a couple does not have sex on days when the woman could become pregnant. Calendar method In this method, the woman keeps track of the length of each menstrual cycle, identifies the days when pregnancy can happen, and does not have sex on those days. Ovulation method In this method, a couple avoids sex during ovulation. Symptothermal method This method involves not having sex during ovulation. The woman typically checks for ovulation by watching changes in her temperature and in the consistency of cervical mucus. Post-ovulation method In this method, a couple waits to have sex until after ovulation. Where to find more information  Centers for Disease Control and Prevention: http://www.wolf.info/ Summary  Contraception, also called birth control, refers to methods or  devices that prevent pregnancy.  Hormonal methods of contraception include implants, injections, pills, patches, vaginal rings, and emergency contraceptives.  Barrier methods of contraception can include female condoms, female condoms, diaphragms, cervical caps, sponges, and spermicides.  There are two types of IUDs (intrauterine devices). An IUD can be put in a woman's uterus to prevent pregnancy for 3-5 years.  Permanent sterilization can be done through a procedure for males and females. Natural family planning methods involve nothaving sex on days when the woman could become pregnant. This information is not intended to replace advice given to you by your health care provider. Make sure you discuss any questions you have with your health care provider. Document Revised: 01/05/2020 Document Reviewed: 01/05/2020 Elsevier Patient Education  [employer].   Breastfeeding  Choosing to breastfeed is one of the best decisions you can make for yourself and your baby. A change in hormones during pregnancy causes your breasts to make breast milk in your milk-producing glands. Hormones prevent breast milk from being released before your baby is born. They also prompt milk flow after birth. Once breastfeeding has begun, thoughts of your baby, as well as his or her sucking or crying, can stimulate the release of milk from your milk-producing glands. Benefits of breastfeeding Research shows that breastfeeding offers many health benefits  for infants and mothers. It also offers a cost-free and convenient way to feed your baby. For your baby  Your first milk (colostrum) helps your baby's digestive system to function better.  Special cells in your milk (antibodies) help your baby to fight off infections.  Breastfed babies are less likely to develop asthma, allergies, obesity, or type 2 diabetes. They are also at lower risk for sudden infant death syndrome (SIDS).  Nutrients in breast milk are better  able to meet your baby's needs compared to infant formula.  Breast milk improves your baby's brain development. For you  Breastfeeding helps to create a very special bond between you and your baby.  Breastfeeding is convenient. Breast milk costs nothing and is always available at the correct temperature.  Breastfeeding helps to burn calories. It helps you to lose the weight that you gained during pregnancy.  Breastfeeding makes your uterus return faster to its size before pregnancy. It also slows bleeding (lochia) after you give birth.  Breastfeeding helps to lower your risk of developing type 2 diabetes, osteoporosis, rheumatoid arthritis, cardiovascular disease, and breast, ovarian, uterine, and endometrial cancer later in life. Breastfeeding basics Starting breastfeeding  Find a comfortable place to sit or lie down, with your neck and back well-supported.  Place a pillow or a rolled-up blanket under your baby to bring him or her to the level of your breast (if you are seated). Nursing pillows are specially designed to help support your arms and your baby while you breastfeed.  Make sure that your baby's tummy (abdomen) is facing your abdomen.  Gently massage your breast. With your fingertips, massage from the outer edges of your breast inward toward the nipple. This encourages milk flow. If your milk flows slowly, you may need to continue this action during the feeding.  Support your breast with 4 fingers underneath and your thumb above your nipple (make the letter "C" with your hand). Make sure your fingers are well away from your nipple and your baby's mouth.  Stroke your baby's lips gently with your finger or nipple.  When your baby's mouth is open wide enough, quickly bring your baby to your breast, placing your entire nipple and as much of the areola as possible into your baby's mouth. The areola is the colored area around your nipple. ? More areola should be visible above your  baby's upper lip than below the lower lip. ? Your baby's lips should be opened and extended outward (flanged) to ensure an adequate, comfortable latch. ? Your baby's tongue should be between his or her lower gum and your breast.  Make sure that your baby's mouth is correctly positioned around your nipple (latched). Your baby's lips should create a seal on your breast and be turned out (everted).  It is common for your baby to suck about 2-3 minutes in order to start the flow of breast milk. Latching Teaching your baby how to latch onto your breast properly is very important. An improper latch can cause nipple pain, decreased milk supply, and poor weight gain in your baby. Also, if your baby is not latched onto your nipple properly, he or she may swallow some air during feeding. This can make your baby fussy. Burping your baby when you switch breasts during the feeding can help to get rid of the air. However, teaching your baby to latch on properly is still the best way to prevent fussiness from swallowing air while breastfeeding. Signs that your baby has successfully latched onto  your nipple  Silent tugging or silent sucking, without causing you pain. Infant's lips should be extended outward (flanged).  Swallowing heard between every 3-4 sucks once your milk has started to flow (after your let-down milk reflex occurs).  Muscle movement above and in front of his or her ears while sucking. Signs that your baby has not successfully latched onto your nipple  Sucking sounds or smacking sounds from your baby while breastfeeding.  Nipple pain. If you think your baby has not latched on correctly, slip your finger into the corner of your baby's mouth to break the suction and place it between your baby's gums. Attempt to start breastfeeding again. Signs of successful breastfeeding Signs from your baby  Your baby will gradually decrease the number of sucks or will completely stop sucking.  Your baby  will fall asleep.  Your baby's body will relax.  Your baby will retain a small amount of milk in his or her mouth.  Your baby will let go of your breast by himself or herself. Signs from you  Breasts that have increased in firmness, weight, and size 1-3 hours after feeding.  Breasts that are softer immediately after breastfeeding.  Increased milk volume, as well as a change in milk consistency and color by the fifth day of breastfeeding.  Nipples that are not sore, cracked, or bleeding. Signs that your baby is getting enough milk  Wetting at least 1-2 diapers during the first 24 hours after birth.  Wetting at least 5-6 diapers every 24 hours for the first week after birth. The urine should be clear or pale yellow by the age of 5 days.  Wetting 6-8 diapers every 24 hours as your baby continues to grow and develop.  At least 3 stools in a 24-hour period by the age of 5 days. The stool should be soft and yellow.  At least 3 stools in a 24-hour period by the age of 7 days. The stool should be seedy and yellow.  No loss of weight greater than 10% of birth weight during the first 3 days of life.  Average weight gain of 4-7 oz (113-198 g) per week after the age of 4 days.  Consistent daily weight gain by the age of 5 days, without weight loss after the age of 2 weeks. After a feeding, your baby may spit up a small amount of milk. This is normal. Breastfeeding frequency and duration Frequent feeding will help you make more milk and can prevent sore nipples and extremely full breasts (breast engorgement). Breastfeed when you feel the need to reduce the fullness of your breasts or when your baby shows signs of hunger. This is called "breastfeeding on demand." Signs that your baby is hungry include:  Increased alertness, activity, or restlessness.  Movement of the head from side to side.  Opening of the mouth when the corner of the mouth or cheek is stroked (rooting).  Increased  sucking sounds, smacking lips, cooing, sighing, or squeaking.  Hand-to-mouth movements and sucking on fingers or hands.  Fussing or crying. Avoid introducing a pacifier to your baby in the first 4-6 weeks after your baby is born. After this time, you may choose to use a pacifier. Research has shown that pacifier use during the first year of a baby's life decreases the risk of sudden infant death syndrome (SIDS). Allow your baby to feed on each breast as long as he or she wants. When your baby unlatches or falls asleep while feeding from the  first breast, offer the second breast. Because newborns are often sleepy in the first few weeks of life, you may need to awaken your baby to get him or her to feed. Breastfeeding times will vary from baby to baby. However, the following rules can serve as a guide to help you make sure that your baby is properly fed:  Newborns (babies 47 weeks of age or younger) may breastfeed every 1-3 hours.  Newborns should not go without breastfeeding for longer than 3 hours during the day or 5 hours during the night.  You should breastfeed your baby a minimum of 8 times in a 24-hour period. Breast milk pumping Pumping and storing breast milk allows you to make sure that your baby is exclusively fed your breast milk, even at times when you are unable to breastfeed. This is especially important if you go back to work while you are still breastfeeding, or if you are not able to be present during feedings. Your lactation consultant can help you find a method of pumping that works best for you and give you guidelines about how long it is safe to store breast milk.      Caring for your breasts while you breastfeed Nipples can become dry, cracked, and sore while breastfeeding. The following recommendations can help keep your breasts moisturized and healthy:  Avoid using soap on your nipples.  Wear a supportive bra designed especially for nursing. Avoid wearing underwire-style  bras or extremely tight bras (sports bras).  Air-dry your nipples for 3-4 minutes after each feeding.  Use only cotton bra pads to absorb leaked breast milk. Leaking of breast milk between feedings is normal.  Use lanolin on your nipples after breastfeeding. Lanolin helps to maintain your skin's normal moisture barrier. Pure lanolin is not harmful (not toxic) to your baby. You may also hand express a few drops of breast milk and gently massage that milk into your nipples and allow the milk to air-dry. In the first few weeks after giving birth, some women experience breast engorgement. Engorgement can make your breasts feel heavy, warm, and tender to the touch. Engorgement peaks within 3-5 days after you give birth. The following recommendations can help to ease engorgement:  Completely empty your breasts while breastfeeding or pumping. You may want to start by applying warm, moist heat (in the shower or with warm, water-soaked hand towels) just before feeding or pumping. This increases circulation and helps the milk flow. If your baby does not completely empty your breasts while breastfeeding, pump any extra milk after he or she is finished.  Apply ice packs to your breasts immediately after breastfeeding or pumping, unless this is too uncomfortable for you. To do this: ? Put ice in a plastic bag. ? Place a towel between your skin and the bag. ? Leave the ice on for 20 minutes, 2-3 times a day.  Make sure that your baby is latched on and positioned properly while breastfeeding. If engorgement persists after 48 hours of following these recommendations, contact your health care provider or a Science writer. Overall health care recommendations while breastfeeding  Eat 3 healthy meals and 3 snacks every day. Well-nourished mothers who are breastfeeding need an additional 450-500 calories a day. You can meet this requirement by increasing the amount of a balanced diet that you eat.  Drink  enough water to keep your urine pale yellow or clear.  Rest often, relax, and continue to take your prenatal vitamins to prevent fatigue, stress, and low  vitamin and mineral levels in your body (nutrient deficiencies).  Do not use any products that contain nicotine or tobacco, such as cigarettes and e-cigarettes. Your baby may be harmed by chemicals from cigarettes that pass into breast milk and exposure to secondhand smoke. If you need help quitting, ask your health care provider.  Avoid alcohol.  Do not use illegal drugs or marijuana.  Talk with your health care provider before taking any medicines. These include over-the-counter and prescription medicines as well as vitamins and herbal supplements. Some medicines that may be harmful to your baby can pass through breast milk.  It is possible to become pregnant while breastfeeding. If birth control is desired, ask your health care provider about options that will be safe while breastfeeding your baby. Where to find more information: [employer]: www.llli.org Contact a health care provider if:  You feel like you want to stop breastfeeding or have become frustrated with breastfeeding.  Your nipples are cracked or bleeding.  Your breasts are red, tender, or warm.  You have: ? Painful breasts or nipples. ? A swollen area on either breast. ? A fever or chills. ? Nausea or vomiting. ? Drainage other than breast milk from your nipples.  Your breasts do not become full before feedings by the fifth day after you give birth.  You feel sad and depressed.  Your baby is: ? Too sleepy to eat well. ? Having trouble sleeping. ? More than 14 week old and wetting fewer than 6 diapers in a 24-hour period. ? Not gaining weight by 2 days of age.  Your baby has fewer than 3 stools in a 24-hour period.  Your baby's skin or the white parts of his or her eyes become yellow. Get help right away if:  Your baby is overly tired  (lethargic) and does not want to wake up and feed.  Your baby develops an unexplained fever. Summary  Breastfeeding offers many health benefits for infant and mothers.  Try to breastfeed your infant when he or she shows early signs of hunger.  Gently tickle or stroke your baby's lips with your finger or nipple to allow the baby to open his or her mouth. Bring the baby to your breast. Make sure that much of the areola is in your baby's mouth. Offer one side and burp the baby before you offer the other side.  Talk with your health care provider or lactation consultant if you have questions or you face problems as you breastfeed. This information is not intended to replace advice given to you by your health care provider. Make sure you discuss any questions you have with your health care provider. Document Revised: 10/25/2017 Document Reviewed: 09/01/2016 Elsevier Patient Education  2021 [employer].

## 2021-01-18 NOTE — Progress Notes (Signed)
Patient was seen on 6/7 for follow-up assessment and education for Gestational Diabetes. EDD 04/15/2021; [redacted]w[redacted]d.   Current medication: NPH 13 units daily. Pt states she injects 2-3x/week. Pt states hypoglycemic symptoms scare her and she experiences these symptoms at 80-90 mg/dL. Pt states this pregnancy continues to affect her appetite and not able to eat much at one time. Pt states she tolerates fruit well in the mornings.  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  Patient was able to identify foods that created BG spikes >200 mg/dL. Discussed strategies changing food choices or potentially taking short acting insulin for meals or high carb snacks.  Pt states she left her iPhone that she has been using for a receiver in [place] and has not been able to get data for the last couple of days. Per A1c improvement and CGM data below, it appears patient is getting much better control.    Dexcom G6 CGM data:    The following learning objectives reviewed during follow-up visit:   Action of NPH and short acting insulin  Reason nighttime NPH can help reducing early morning pre-meal blood sugar  Strategies to be able to eat some foods patients enjoys with fewer BG excursions  Plan:  . Continue to pay attention to how foods affect blood sugar and adjust snacks and meals. . Aim to inject NPH insulin same time each night . Return for follow-up if desired or MD advises.  Patient instructed to monitor glucose levels: FBS: 70 - 95 mg/dl 2 hour: <120 mg/dl  Patient received the following handouts:  None- reviewed graphs of different types of insulin, but patient did not desire a copy.  Patient will be seen for follow-up needed.

## 2021-01-18 NOTE — Progress Notes (Signed)
   Subjective:  Tamara Reyes is a 25 y.o. G1P0000 at [redacted]w[redacted]d being seen today for ongoing prenatal care.  She is currently monitored for the following issues for this high-risk pregnancy and has Hyperlipidemia; Chronic hypertension during pregnancy, antepartum; Pre-existing type 2 diabetes mellitus in pregnancy; Supervision of high risk pregnancy, antepartum; Hyperthyroidism; Alpha thalassemia silent carrier; H/O fibromyalgia; HSV-2 infection; Anemia during pregnancy in second trimester; History of COVID-19; and Marijuana use on their problem list.  Patient reports no complaints.  Contractions: Not present. Vag. Bleeding: None.  Movement: Present. Denies leaking of fluid.   The following portions of the patient's history were reviewed and updated as appropriate: allergies, current medications, past family history, past medical history, past social history, past surgical history and problem list. Problem list updated.  Objective:   Vitals:   01/18/21 1520  BP: 126/75  Pulse: 89  Weight: 188 lb 14.4 oz (85.7 kg)    Fetal Status: Fetal Heart Rate (bpm): 144   Movement: Present     General:  Alert, oriented and cooperative. Patient is in no acute distress.  Skin: Skin is warm and dry. No rash noted.   Cardiovascular: Normal heart rate noted  Respiratory: Normal respiratory effort, no problems with respiration noted  Abdomen: Soft, gravid, appropriate for gestational age. Pain/Pressure: Absent     Pelvic: Vag. Bleeding: None     Cervical exam deferred        Extremities: Normal range of motion.  Edema: None  Mental Status: Normal mood and affect. Normal behavior. Normal judgment and thought content.   Urinalysis:      Assessment and Plan:  Pregnancy: G1P0000 at [redacted]w[redacted]d  1. Supervision of high risk pregnancy, antepartum BP and FHR normal 3rd tri labs today TDaP today - CBC - HIV antibody (with reflex) - RPR  2. Chronic hypertension during pregnancy, antepartum Doing well without  meds Following w MFM  3. Pre-existing type 2 diabetes mellitus during pregnancy in third trimester Sugars reviewed, per note from DM educator only having intermittent excursions  Cont NPH 13u daily May need to add mealtime pending progress at next visit rx sent for receiver device per patient request Has follow up growth w MFM in a few days, will need to start antenatal testing soon Also discussed importance and rationale behind need for fetal echo, rescheduled today  4. Hyperthyroidism Check TFT's today Last check with mildly low TSH, normal T4  5. HSV-2 infection No issues currently  Preterm labor symptoms and general obstetric precautions including but not limited to vaginal bleeding, contractions, leaking of fluid and fetal movement were reviewed in detail with the patient. Please refer to After Visit Summary for other counseling recommendations.  Return in 2 weeks (on 02/01/2021) for [facility], ob visit, needs MD.   Clarnce Flock, MD

## 2021-01-19 ENCOUNTER — Other Ambulatory Visit: Payer: Self-pay

## 2021-01-19 DIAGNOSIS — Z3403 Encounter for supervision of normal first pregnancy, third trimester: Secondary | ICD-10-CM | POA: Diagnosis not present

## 2021-01-19 LAB — CBC
Hematocrit: 32.8 % — ABNORMAL LOW (ref 34.0–46.6)
Hemoglobin: 10.9 g/dL — ABNORMAL LOW (ref 11.1–15.9)
MCH: 26.8 pg (ref 26.6–33.0)
MCHC: 33.2 g/dL (ref 31.5–35.7)
MCV: 81 fL (ref 79–97)
Platelets: 282 10*3/uL (ref 150–450)
RBC: 4.07 x10E6/uL (ref 3.77–5.28)
RDW: 12.6 % (ref 11.7–15.4)
WBC: 14.4 10*3/uL — ABNORMAL HIGH (ref 3.4–10.8)

## 2021-01-19 LAB — T4, FREE: Free T4: 1.16 ng/dL (ref 0.82–1.77)

## 2021-01-19 LAB — TSH: TSH: 0.524 u[IU]/mL (ref 0.450–4.500)

## 2021-01-19 LAB — HIV ANTIBODY (ROUTINE TESTING W REFLEX): HIV Screen 4th Generation wRfx: NONREACTIVE

## 2021-01-19 LAB — RPR: RPR Ser Ql: NONREACTIVE

## 2021-01-20 ENCOUNTER — Other Ambulatory Visit: Payer: Self-pay

## 2021-01-21 ENCOUNTER — Ambulatory Visit: Payer: Medicaid Other | Attending: Obstetrics and Gynecology

## 2021-01-21 ENCOUNTER — Ambulatory Visit: Payer: Medicaid Other | Admitting: *Deleted

## 2021-01-21 ENCOUNTER — Other Ambulatory Visit: Payer: Self-pay

## 2021-01-21 VITALS — BP 127/73 | HR 88

## 2021-01-21 DIAGNOSIS — Z148 Genetic carrier of other disease: Secondary | ICD-10-CM | POA: Diagnosis not present

## 2021-01-21 DIAGNOSIS — O24113 Pre-existing diabetes mellitus, type 2, in pregnancy, third trimester: Secondary | ICD-10-CM

## 2021-01-21 DIAGNOSIS — Z3A28 28 weeks gestation of pregnancy: Secondary | ICD-10-CM

## 2021-01-21 DIAGNOSIS — Z363 Encounter for antenatal screening for malformations: Secondary | ICD-10-CM | POA: Diagnosis not present

## 2021-01-21 DIAGNOSIS — Z794 Long term (current) use of insulin: Secondary | ICD-10-CM

## 2021-01-21 DIAGNOSIS — E119 Type 2 diabetes mellitus without complications: Secondary | ICD-10-CM

## 2021-01-21 DIAGNOSIS — O10013 Pre-existing essential hypertension complicating pregnancy, third trimester: Secondary | ICD-10-CM | POA: Diagnosis not present

## 2021-01-21 DIAGNOSIS — O3413 Maternal care for benign tumor of corpus uteri, third trimester: Secondary | ICD-10-CM | POA: Diagnosis not present

## 2021-01-21 DIAGNOSIS — O24119 Pre-existing diabetes mellitus, type 2, in pregnancy, unspecified trimester: Secondary | ICD-10-CM | POA: Diagnosis not present

## 2021-01-21 DIAGNOSIS — D259 Leiomyoma of uterus, unspecified: Secondary | ICD-10-CM | POA: Diagnosis not present

## 2021-01-21 DIAGNOSIS — O10912 Unspecified pre-existing hypertension complicating pregnancy, second trimester: Secondary | ICD-10-CM | POA: Insufficient documentation

## 2021-01-24 ENCOUNTER — Ambulatory Visit: Payer: Medicaid Other | Attending: Obstetrics & Gynecology | Admitting: Physical Therapy

## 2021-01-24 ENCOUNTER — Other Ambulatory Visit: Payer: Self-pay

## 2021-01-24 ENCOUNTER — Encounter: Payer: Self-pay | Admitting: Physical Therapy

## 2021-01-24 DIAGNOSIS — G8929 Other chronic pain: Secondary | ICD-10-CM

## 2021-01-24 DIAGNOSIS — M545 Low back pain, unspecified: Secondary | ICD-10-CM | POA: Diagnosis not present

## 2021-01-24 DIAGNOSIS — M6281 Muscle weakness (generalized): Secondary | ICD-10-CM | POA: Diagnosis not present

## 2021-01-24 NOTE — Therapy (Signed)
[facility] [address], [place], [place], [address] Phone: [phone]   Fax:  [phone]  Physical Therapy Treatment  Patient Details  Name: Tamara Reyes MRN: [id number] Date of Birth: 04/25/1996 Referring Provider (PT): Woodroe Mode, MD   Encounter Date: 01/24/2021   PT End of Session - 01/24/21 1456     Visit Number 2    Date for PT Re-Evaluation 03/02/21    Authorization Type medicaid prep    PT Start Time 1449    PT Stop Time 1638    PT Time Calculation (min) 41 min    Activity Tolerance Patient tolerated treatment well    Behavior During Therapy [facility] for tasks assessed/performed             Past Medical History:  Diagnosis Date   Asthma    COVID-19 08/16/2020   DM (diabetes mellitus), type 2 ([place])    Fibromyalgia    Genital herpes    History of marijuana use 2021   Hypercholesteremia    Hypertension    Nexplanon insertion 08/26/2013   Lot #[id number] Exp 02/2016    PCOS (polycystic ovarian syndrome)     Past Surgical History:  Procedure Laterality Date   abscess removal      TONSILLECTOMY     TONSILLECTOMY AND ADENOIDECTOMY     tonsils only    There were no vitals filed for this visit.   Subjective Assessment - 01/24/21 1453     Subjective Pt states she is still having a lot of pain but it has been less severe.    Pertinent History hypertension during pregnancy, type 2 DM, PCOS, fibro    Patient Stated Goals just less pain    Currently in Pain? Yes    Pain Score 5     Pain Location Abdomen    Pain Orientation Lower    Pain Descriptors / Indicators Aching    Pain Type Chronic pain    Pain Frequency Constant    Pain Relieving Factors muscle rub    Multiple Pain Sites No                               [facility] Adult PT Treatment/Exercise - 01/24/21 0001       Exercises   Exercises Lumbar      Lumbar Exercises: Quadruped   Other Quadruped Lumbar Exercises  transverse abdominus contract in qped      Manual Therapy   Manual Therapy Internal Pelvic Floor;Soft tissue mobilization    Manual therapy comments pt identity confirmed and internal soft tissue treatment performed with consent    Soft tissue mobilization lumbar and gluteals    Internal Pelvic Floor levators Lt>Rt                         PT Long Term Goals - 12/08/20 1551       PT LONG TERM GOAL #1   Title Pt will report 25% less pain in typical day    Time 12    Period Weeks    Status New    Target Date 03/02/21      PT LONG TERM GOAL #2   Title Pt will be able to be active and have a plan she can do even on days that she is having more pain    Baseline fibromyalgia causes her to just lie in bed some  days    Time 12    Period Weeks    Status New    Target Date 03/02/21      PT LONG TERM GOAL #3   Title Pt will be ind with advanced HEP as part of a healthy pregnancy    Time 12    Period Weeks    Status New    Target Date 03/02/21      PT LONG TERM GOAL #4   Title Pt will have 5/5 bilateral hip strength for improved pelvic stability during functional activities at her job that 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    Baseline 4-/5 +pain    Time 12    Period Weeks    Status New    Target Date 03/02/21                   Plan - 01/24/21 1518     Clinical Impression Statement Pt did well with STM and when exhaling tightened a little. She did well with initial HEP given today in order to improve muscle length obtained today.  Pt did better keeping pelvic floor relaxed with gently engaged TrA.  Pt will benefit from skilled PT to continue working on muscle coordination for reduced pain.    Comorbidities hypertension during pregnancy, type 2 DM, PCOS, fibromyalgia    PT Treatment/Interventions ADLs/Self Care Home Management;Biofeedback;Cryotherapy;Electrical Stimulation;Neuromuscular re-education;Therapeutic exercise;Therapeutic  activities;Patient/family education;Manual techniques;Dry needling;Passive range of motion;Taping    PT Next Visit Plan internal STM if needed; core progression, lumbar and gluteal STM    Consulted and Agree with Plan of Care Patient             Patient will benefit from skilled therapeutic intervention in order to improve the following deficits and impairments:  Abnormal gait, Pain, Increased fascial restricitons, Postural dysfunction, Decreased strength, Impaired flexibility  Visit Diagnosis: Chronic low back pain, unspecified back pain laterality, unspecified whether sciatica present  Muscle weakness (generalized)     Problem List Patient Active Problem List   Diagnosis Date Noted   Marijuana use 12/28/2020   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   Anemia during pregnancy in second trimester 11/18/2020   History of COVID-19 11/18/2020   H/O fibromyalgia 10/21/2020   Alpha thalassemia silent carrier 10/20/2020   Hyperthyroidism 10/18/2020   Supervision of high risk pregnancy, antepartum 09/22/2020   Pre-existing type 2 diabetes mellitus in pregnancy 01/07/2014   Hyperlipidemia 10/19/2011   Chronic hypertension during pregnancy, antepartum 10/19/2011    Camillo Flaming Aljean Horiuchi,PT 01/24/2021, 5:06 PM  [facility] [address] [place], [place], [address] Phone: [phone]   Fax:  [phone]  Name: Valen Mascaro MRN: [id number] Date of Birth: 1995/09/16

## 2021-01-24 NOTE — Patient Instructions (Signed)
Access Code: [email or web address] URL: [email or web address] Date: 01/24/2021 Prepared by: Jari Favre  Exercises Table Lean - 3 x daily - 7 x weekly - 1 sets - 10 reps Seated Piriformis Stretch - 1 x daily - 7 x weekly - 1 sets - 3 reps - 30 sec hold Seated Hamstring Stretch - 1 x daily - 7 x weekly - 1 sets - 3 reps - 30 sec hold Standing Low Back Flexion at Table - 3 x daily - 7 x weekly - 1 sets - 8 reps  URL: [email or web address] Date: 01/24/2021 Prepared by: Jari Favre  Exercises Table Lean - 3 x daily - 7 x weekly - 1 sets - 10 reps Seated Piriformis Stretch - 1 x daily - 7 x weekly - 1 sets - 3 reps - 30 sec hold Seated Hamstring Stretch - 1 x daily - 7 x weekly - 1 sets - 3 reps - 30 sec hold Standing Low Back Flexion at Table - 3 x daily - 7 x weekly - 1 sets - 8 reps

## 2021-01-25 ENCOUNTER — Other Ambulatory Visit: Payer: Self-pay | Admitting: *Deleted

## 2021-01-25 DIAGNOSIS — O10913 Unspecified pre-existing hypertension complicating pregnancy, third trimester: Secondary | ICD-10-CM

## 2021-01-26 DIAGNOSIS — Z3403 Encounter for supervision of normal first pregnancy, third trimester: Secondary | ICD-10-CM | POA: Diagnosis not present

## 2021-01-27 ENCOUNTER — Other Ambulatory Visit: Payer: Self-pay

## 2021-02-01 ENCOUNTER — Other Ambulatory Visit: Payer: Self-pay

## 2021-02-01 MED FILL — Omeprazole Cap Delayed Release 20 MG: ORAL | 30 days supply | Qty: 30 | Fill #0 | Status: CN

## 2021-02-01 MED FILL — Sertraline HCl Tab 50 MG: ORAL | 30 days supply | Qty: 30 | Fill #0 | Status: CN

## 2021-02-02 ENCOUNTER — Encounter: Payer: Medicaid Other | Admitting: Physical Therapy

## 2021-02-02 ENCOUNTER — Other Ambulatory Visit: Payer: Self-pay

## 2021-02-02 DIAGNOSIS — Z3403 Encounter for supervision of normal first pregnancy, third trimester: Secondary | ICD-10-CM | POA: Diagnosis not present

## 2021-02-04 ENCOUNTER — Inpatient Hospital Stay (HOSPITAL_COMMUNITY): Payer: Medicaid Other

## 2021-02-04 ENCOUNTER — Encounter (HOSPITAL_COMMUNITY): Payer: Self-pay | Admitting: Obstetrics & Gynecology

## 2021-02-04 ENCOUNTER — Inpatient Hospital Stay (HOSPITAL_COMMUNITY)
Admission: AD | Admit: 2021-02-04 | Discharge: 2021-02-04 | Disposition: A | Discharge status: Home / Self Care | Payer: Medicaid Other | Attending: Obstetrics & Gynecology | Admitting: Obstetrics & Gynecology

## 2021-02-04 ENCOUNTER — Other Ambulatory Visit: Payer: Self-pay

## 2021-02-04 DIAGNOSIS — O24113 Pre-existing diabetes mellitus, type 2, in pregnancy, third trimester: Secondary | ICD-10-CM

## 2021-02-04 DIAGNOSIS — Z8249 Family history of ischemic heart disease and other diseases of the circulatory system: Secondary | ICD-10-CM | POA: Diagnosis not present

## 2021-02-04 DIAGNOSIS — E119 Type 2 diabetes mellitus without complications: Secondary | ICD-10-CM | POA: Diagnosis not present

## 2021-02-04 DIAGNOSIS — O212 Late vomiting of pregnancy: Secondary | ICD-10-CM

## 2021-02-04 DIAGNOSIS — R1011 Right upper quadrant pain: Secondary | ICD-10-CM | POA: Diagnosis not present

## 2021-02-04 DIAGNOSIS — O99013 Anemia complicating pregnancy, third trimester: Secondary | ICD-10-CM | POA: Diagnosis not present

## 2021-02-04 DIAGNOSIS — Z20822 Contact with and (suspected) exposure to covid-19: Secondary | ICD-10-CM

## 2021-02-04 DIAGNOSIS — O99891 Other specified diseases and conditions complicating pregnancy: Secondary | ICD-10-CM | POA: Diagnosis not present

## 2021-02-04 DIAGNOSIS — Z833 Family history of diabetes mellitus: Secondary | ICD-10-CM | POA: Diagnosis not present

## 2021-02-04 DIAGNOSIS — O99283 Endocrine, nutritional and metabolic diseases complicating pregnancy, third trimester: Secondary | ICD-10-CM | POA: Insufficient documentation

## 2021-02-04 DIAGNOSIS — Z794 Long term (current) use of insulin: Secondary | ICD-10-CM | POA: Insufficient documentation

## 2021-02-04 DIAGNOSIS — O10913 Unspecified pre-existing hypertension complicating pregnancy, third trimester: Secondary | ICD-10-CM | POA: Diagnosis not present

## 2021-02-04 DIAGNOSIS — O219 Vomiting of pregnancy, unspecified: Secondary | ICD-10-CM

## 2021-02-04 DIAGNOSIS — E0590 Thyrotoxicosis, unspecified without thyrotoxic crisis or storm: Secondary | ICD-10-CM | POA: Diagnosis not present

## 2021-02-04 DIAGNOSIS — O26893 Other specified pregnancy related conditions, third trimester: Secondary | ICD-10-CM | POA: Diagnosis not present

## 2021-02-04 DIAGNOSIS — Z3A30 30 weeks gestation of pregnancy: Secondary | ICD-10-CM | POA: Diagnosis not present

## 2021-02-04 DIAGNOSIS — O368130 Decreased fetal movements, third trimester, not applicable or unspecified: Secondary | ICD-10-CM | POA: Diagnosis not present

## 2021-02-04 LAB — COMPREHENSIVE METABOLIC PANEL
ALT: 12 U/L (ref 0–44)
AST: 16 U/L (ref 15–41)
Albumin: 2.8 g/dL — ABNORMAL LOW (ref 3.5–5.0)
Alkaline Phosphatase: 58 U/L (ref 38–126)
Anion gap: 6 (ref 5–15)
BUN: <5 mg/dL — ABNORMAL LOW (ref 6–20)
CO2: 28 mmol/L (ref 22–32)
Calcium: 9.2 mg/dL (ref 8.9–10.3)
Chloride: 103 mmol/L (ref 98–111)
Creatinine, Ser: 0.59 mg/dL (ref 0.44–1.00)
GFR, Estimated: >60 mL/min (ref >60)
Glucose, Bld: 94 mg/dL (ref 70–99)
Potassium: 3.6 mmol/L (ref 3.5–5.1)
Sodium: 137 mmol/L (ref 135–145)
Total Bilirubin: 0.8 mg/dL (ref 0.3–1.2)
Total Protein: 6.4 g/dL — ABNORMAL LOW (ref 6.5–8.1)

## 2021-02-04 LAB — URINALYSIS, ROUTINE W REFLEX MICROSCOPIC
Bilirubin Urine: NEGATIVE
Glucose, UA: NEGATIVE mg/dL
Hgb urine dipstick: NEGATIVE
Ketones, ur: NEGATIVE mg/dL
Leukocytes,Ua: NEGATIVE
Nitrite: NEGATIVE
Protein, ur: NEGATIVE mg/dL
Specific Gravity, Urine: 1.011 (ref 1.005–1.030)
pH: 8 (ref 5.0–8.0)

## 2021-02-04 LAB — CBC WITH DIFFERENTIAL/PLATELET
Abs Immature Granulocytes: 0.07 10*3/uL (ref 0.00–0.07)
Basophils Absolute: 0 10*3/uL (ref 0.0–0.1)
Basophils Relative: 0 %
Eosinophils Absolute: 0 10*3/uL (ref 0.0–0.5)
Eosinophils Relative: 0 %
HCT: 34.3 % — ABNORMAL LOW (ref 36.0–46.0)
Hemoglobin: 11.2 g/dL — ABNORMAL LOW (ref 12.0–15.0)
Immature Granulocytes: 1 %
Lymphocytes Relative: 19 %
Lymphs Abs: 2.8 10*3/uL (ref 0.7–4.0)
MCH: 26.8 pg (ref 26.0–34.0)
MCHC: 32.7 g/dL (ref 30.0–36.0)
MCV: 82.1 fL (ref 80.0–100.0)
Monocytes Absolute: 1.2 10*3/uL — ABNORMAL HIGH (ref 0.1–1.0)
Monocytes Relative: 8 %
Neutro Abs: 10.8 10*3/uL — ABNORMAL HIGH (ref 1.7–7.7)
Neutrophils Relative %: 72 %
Platelets: 282 10*3/uL (ref 150–400)
RBC: 4.18 MIL/uL (ref 3.87–5.11)
RDW: 12.8 % (ref 11.5–15.5)
WBC: 15 10*3/uL — ABNORMAL HIGH (ref 4.0–10.5)
nRBC: 0 % (ref 0.0–0.2)

## 2021-02-04 LAB — GLUCOSE, CAPILLARY: Glucose-Capillary: 105 mg/dL — ABNORMAL HIGH (ref 70–99)

## 2021-02-04 LAB — RESP PANEL BY RT-PCR (FLU A&B, COVID) ARPGX2
Influenza A by PCR: NEGATIVE
Influenza B by PCR: NEGATIVE
SARS Coronavirus 2 by RT PCR: NEGATIVE

## 2021-02-04 NOTE — MAU Note (Signed)
Been throwing up since 0530.  Noted some blood in it.  Has  been throwing up through out preg, only one other time she saw blood.  body has been achy.  2 co-workers were dx with covid.  Really hurts to lay on rt side.  Keeps getting this sharp pain in RUQ..  has been having headaches off and on, only on one side. Has also been constipated.( Last BM ? Sunday). Hadn't felt baby move since last night

## 2021-02-04 NOTE — MAU Provider Note (Signed)
History     CSN: [id number]  Arrival date and time: 02/04/21 1151   Event Date/Time   First Provider Initiated Contact with Patient 02/04/21 1343      Chief Complaint  Patient presents with   Emesis   Generalized Body Aches   Abdominal Pain   Constipation   ? covid exposure   [name] is a 25 y.o. G1P0000 at [redacted]w[redacted]d who presents to MAU for multiple concerns.  Pt is concerned she might have been exposed to COVID at work. Patient reports her work building was closed for disinfection on 02/03/2021 and 02/04/2021 because two people got COVID. Patient reports she sometimes wears a mask at work. Patient reports she does not know who the people are that tested positive, so she does not know about her risk for exposure. Patient reports she had COVID 08/2020.  Patient also reports DFM that started 2 days ago. Patient reports the movement is back to normal since arriving in MAU.  Patient also reports vomiting since 5AM this morning. Patient endorses vomiting x3. Patient reports eating a successfully banana while in MAU. Patient denies sick contacts. Patient reports she has promethazine at home but did not take it.  Patient reports an expired glucose monitor x2 weeks. Pt reports she has a glucose monitor at home to replace it, but just has not done so.  Patient also reports sharp, RUQ pain x2 days.  Pt denies VB, LOF, ctx, vaginal discharge/odor/itching. Pt denies constipation, diarrhea, or urinary problems. Pt denies fever, chills, fatigue, sweating or changes in appetite. Pt denies SOB or chest pain. Pt denies dizziness, HA, light-headedness, weakness.  Problems this pregnancy include: Type II DM, anemia, cHTN, hyperthyroid. Allergies? NKDA Current medications/supplements? Insulin, Flexeril, Vistaril, miralax, PNV Prenatal care provider? [facility], next appt 02/08/2021   OB History     Gravida  1   Para  0   Term  0   Preterm  0   AB  0   Living  0      SAB  0   IAB   0   Ectopic  0   Multiple  0   Live Births              Past Medical History:  Diagnosis Date   Asthma    COVID-19 08/16/2020   DM (diabetes mellitus), type 2 ([place])    Fibromyalgia    Genital herpes    History of marijuana use 2021   Hypercholesteremia    Hypertension    Nexplanon insertion 08/26/2013   Lot #[id number] Exp 02/2016    PCOS (polycystic ovarian syndrome)     Past Surgical History:  Procedure Laterality Date   abscess removal      TONSILLECTOMY     TONSILLECTOMY AND ADENOIDECTOMY     tonsils only    Family History  Problem Relation Age of Onset   Hypertension Mother    Diabetes Mother    Hypertension Maternal Grandmother    Hypertension Maternal Grandfather    Hypertension Paternal Grandmother    Diabetes Paternal Grandmother     Social History   Tobacco Use   Smoking status: Former    Pack years: 0.00    Types: Cigarettes    Quit date: 05/28/2014    Years since quitting: 6.6   Smokeless tobacco: Never  Vaping Use   Vaping Use: Never used  Substance Use Topics   Alcohol use: No   Drug use: Not Currently    Types: Marijuana  Comment: stopped once pregnant    Allergies: No Known Allergies  Medications Prior to Admission  Medication Sig Dispense Refill Last Dose   acetaminophen (TYLENOL) 500 MG tablet Take 500 mg by mouth every 6 (six) hours as needed.   02/03/2021   Continuous Blood Gluc Transmit (DEXCOM G6 TRANSMITTER) MISC 1 Device by Does not apply route as directed. 1 each 3 02/04/2021   cyclobenzaprine (FLEXERIL) 10 MG tablet Take 1 tablet (10 mg total) by mouth 2 (two) times daily as needed for muscle spasms. 20 tablet 0 Past Week   insulin NPH Human (NOVOLIN N) 100 UNIT/ML injection INJECT 0.13 MLS (13 UNITS TOTAL) INTO THE SKIN DAILY WITH BREAKFAST. 10 mL 3 Past Week   polyethylene glycol (MIRALAX / GLYCOLAX) 17 g packet Take 17 g by mouth daily.   02/03/2021   Prenatal Vit-Fe Fumarate-FA (PRENATAL MULTIVITAMIN) TABS tablet  Take 1 tablet by mouth daily at 12 noon.   02/03/2021   sertraline (ZOLOFT) 50 MG tablet TAKE 1 TABLET (50 MG TOTAL) BY MOUTH DAILY. 30 tablet 5 02/03/2021   valACYclovir (VALTREX) 500 MG tablet Take 1 tablet (500 mg total) by mouth daily. 30 tablet 12 02/03/2021   Continuous Blood Gluc Receiver (DEXCOM G6 RECEIVER) DEVI use as directed. 1 each 0    Continuous Blood Gluc Sensor (DEXCOM G6 SENSOR) MISC 1 Device by Does not apply route as directed. 3 each 33    hydrOXYzine (ATARAX/VISTARIL) 25 MG tablet TAKE 1 TABLET (25 MG TOTAL) BY MOUTH AT BEDTIME. 30 tablet 1    Insulin Pen Needle (PEN NEEDLES) 31G X 8 MM MISC Inject 1 application into the skin at bedtime. 100 each 3    omeprazole (PRILOSEC) 20 MG capsule TAKE 1 CAPSULE (20 MG TOTAL) BY MOUTH IN THE MORNING AND AT BEDTIME. 30 capsule 3 More than a month   promethazine (PHENERGAN) 25 MG tablet Take 1 tablet (25 mg total) by mouth at bedtime as needed for nausea or vomiting. 30 tablet 2     Review of Systems  Constitutional:  Negative for chills, diaphoresis, fatigue and fever.  Eyes:  Negative for visual disturbance.  Respiratory:  Negative for shortness of breath.   Cardiovascular:  Negative for chest pain.  Gastrointestinal:  Positive for abdominal pain (RUQ), nausea and vomiting. Negative for constipation and diarrhea.  Genitourinary:  Negative for dysuria, flank pain, frequency, pelvic pain, urgency, vaginal bleeding and vaginal discharge.  Neurological:  Negative for dizziness, weakness, light-headedness and headaches.   Physical Exam   Blood pressure 131/79, pulse (!) 103, temperature 98.1 F (36.7 C), temperature source Oral, resp. rate 16, height 5\' 8"  (1.727 m), weight 87 kg, last menstrual period 07/09/2020, SpO2 100 %.  Patient Vitals for the past 24 hrs:  BP Temp Temp src Pulse Resp SpO2 Height Weight  02/04/21 1211 131/79 98.1 F (36.7 C) Oral (!) 103 16 100 % 5\' 8"  (1.727 m) 87 kg  02/04/21 1208 -- -- -- -- -- 100 % -- --    Physical Exam Constitutional:      General: She is not in acute distress.    Appearance: She is well-developed. She is not diaphoretic.  HENT:     Head: Normocephalic and atraumatic.  Pulmonary:     Effort: Pulmonary effort is normal.  Abdominal:     General: There is no distension.     Palpations: Abdomen is soft. There is no mass.     Tenderness: There is abdominal tenderness in the right upper  quadrant. There is no guarding or rebound.  Skin:    General: Skin is warm and dry.  Neurological:     Mental Status: She is alert and oriented to person, place, and time.  Psychiatric:        Behavior: Behavior normal.        Thought Content: Thought content normal.        Judgment: Judgment normal.   Results for orders placed or performed during the hospital encounter of 02/04/21 (from the past 24 hour(s))  Urinalysis, Routine w reflex microscopic     Status: None   Collection Time: 02/04/21 11:51 AM  Result Value Ref Range   Color, Urine YELLOW YELLOW   APPearance CLEAR CLEAR   Specific Gravity, Urine 1.011 1.005 - 1.030   pH 8.0 5.0 - 8.0   Glucose, UA NEGATIVE NEGATIVE mg/dL   Hgb urine dipstick NEGATIVE NEGATIVE   Bilirubin Urine NEGATIVE NEGATIVE   Ketones, ur NEGATIVE NEGATIVE mg/dL   Protein, ur NEGATIVE NEGATIVE mg/dL   Nitrite NEGATIVE NEGATIVE   Leukocytes,Ua NEGATIVE NEGATIVE  Resp Panel by RT-PCR (Flu A&B, Covid) Nasopharyngeal Swab     Status: None   Collection Time: 02/04/21  1:16 PM   Specimen: Nasopharyngeal Swab; Nasopharyngeal(NP) swabs in vial transport medium  Result Value Ref Range   SARS Coronavirus 2 by RT PCR NEGATIVE NEGATIVE   Influenza A by PCR NEGATIVE NEGATIVE   Influenza B by PCR NEGATIVE NEGATIVE  Glucose, capillary     Status: Abnormal   Collection Time: 02/04/21  1:24 PM  Result Value Ref Range   Glucose-Capillary 105 (H) 70 - 99 mg/dL  CBC with Differential/Platelet     Status: Abnormal   Collection Time: 02/04/21  2:55 PM  Result Value  Ref Range   WBC 15.0 (H) 4.0 - 10.5 K/uL   RBC 4.18 3.87 - 5.11 MIL/uL   Hemoglobin 11.2 (L) 12.0 - 15.0 g/dL   HCT 34.3 (L) 36.0 - 46.0 %   MCV 82.1 80.0 - 100.0 fL   MCH 26.8 26.0 - 34.0 pg   MCHC 32.7 30.0 - 36.0 g/dL   RDW 12.8 11.5 - 15.5 %   Platelets 282 150 - 400 K/uL   nRBC 0.0 0.0 - 0.2 %   Neutrophils Relative % 72 %   Neutro Abs 10.8 (H) 1.7 - 7.7 K/uL   Lymphocytes Relative 19 %   Lymphs Abs 2.8 0.7 - 4.0 K/uL   Monocytes Relative 8 %   Monocytes Absolute 1.2 (H) 0.1 - 1.0 K/uL   Eosinophils Relative 0 %   Eosinophils Absolute 0.0 0.0 - 0.5 K/uL   Basophils Relative 0 %   Basophils Absolute 0.0 0.0 - 0.1 K/uL   Immature Granulocytes 1 %   Abs Immature Granulocytes 0.07 0.00 - 0.07 K/uL  Comprehensive metabolic panel     Status: Abnormal   Collection Time: 02/04/21  2:55 PM  Result Value Ref Range   Sodium 137 135 - 145 mmol/L   Potassium 3.6 3.5 - 5.1 mmol/L   Chloride 103 98 - 111 mmol/L   CO2 28 22 - 32 mmol/L   Glucose, Bld 94 70 - 99 mg/dL   BUN <5 (L) 6 - 20 mg/dL   Creatinine, Ser 0.59 0.44 - 1.00 mg/dL   Calcium 9.2 8.9 - 10.3 mg/dL   Total Protein 6.4 (L) 6.5 - 8.1 g/dL   Albumin 2.8 (L) 3.5 - 5.0 g/dL   AST 16 15 - 41 U/L  ALT 12 0 - 44 U/L   Alkaline Phosphatase 58 38 - 126 U/L   Total Bilirubin 0.8 0.3 - 1.2 mg/dL   GFR, Estimated >60 >60 mL/min   Anion gap 6 5 - 15    [place] MFM OB FOLLOW UP  Result Date: 01/21/2021 ----------------------------------------------------------------------  OBSTETRICS REPORT                       (Signed Final 01/21/2021 04:55 pm) ---------------------------------------------------------------------- Patient Info  ID #:       [id number]                          D.O.B.:  [birth year] (24 yrs)  Name:       Tamara Reyes                      Visit Date: 01/21/2021 03:26 pm ---------------------------------------------------------------------- Performed By  Attending:        Tama High MD        Ref. Address:     [address]                                                              [place], [place]  Performed By:     Lacretia Leigh       [facility]                                                             [place] [facility]                                                             [facility]  Referred By:      [facility] ---------------------------------------------------------------------- Orders  #  Description                           Code        Ordered By  1  [place] MFM OB FOLLOW UP                   [id number]    YU FANG ----------------------------------------------------------------------  #  Order #                     Accession #                Episode #  1  [id number]                   [id number]                 [id number] ---------------------------------------------------------------------- Indications  [redacted] weeks gestation of pregnancy                Z3A.28  Pre-existing diabetes, type 2, in pregnancy,   O24.112  second trimester(Insulin)  Encounter for antenatal screening for          Z36.3  malformations  Hypertension - Chronic/Pre-existing(No         O10.019  Meds)  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 Location:         Intrauterine  Fetal Heart Rate(bpm):  153  Cardiac Activity:       Observed  Presentation:           Cephalic  Placenta:               Posterior  P. Cord Insertion:      Previously Visualized  Amniotic Fluid  AFI FV:      Within normal limits  AFI Sum(cm)     %Tile       Largest Pocket(cm)  16.4            60          4.6  RUQ(cm)        RLQ(cm)       LUQ(cm)        LLQ(cm)  3.7           3.8           4.6            4.3 ---------------------------------------------------------------------- Biometry  BPD:      71.8  mm     G. Age:  28w 6d         65  %    CI:        73.21   %    70 - 86                                                          FL/HC:      19.9   %    18.8 - 20.6  HC:  266.7  mm     G. Age:  29w 0d         64  %    HC/AC:      1.08        1.05 - 1.21  AC:      247.9  mm     G. Age:  29w 0d         73  %    FL/BPD:     74.1   %    71 - 87  FL:       53.2  mm     G. Age:  28w 2d         42  %    FL/AC:      21.5   %    20 - 24  HUM:      45.5  mm     G. Age:  26w 6d         22  %  LV:        5.2  mm  Est. FW:    1278  gm    2 lb 13 oz      67  % ---------------------------------------------------------------------- OB History  Gravidity:    1         Term:   0        Prem:   0        SAB:   0  TOP:          0       Ectopic:  0        Living: 0 ---------------------------------------------------------------------- Gestational Age  LMP:           28w 0d        Date:  07/09/20                 EDD:   04/15/21  U/S Today:     28w 6d                                        EDD:   04/09/21  Best:          Timothy Lasso 0d     Det. By:  LMP  (07/09/20)          EDD:   04/15/21 ---------------------------------------------------------------------- Anatomy  Cranium:               Appears normal         LVOT:                   Previously seen  Cavum:                 Appears normal         Aortic Arch:            Previously seen  Ventricles:            Appears normal         Ductal Arch:            Previously seen  Choroid Plexus:        Previously seen        Diaphragm:              Appears normal  Cerebellum:            Previously seen  Stomach:                Appears normal, left                                                                        sided  Posterior Fossa:       Previously seen        Abdomen:                Appears normal   Nuchal Fold:           Previously seen        Abdominal Wall:         Previously seen  Face:                  Profile appears        Cord Vessels:           Previously seen                         normal  Lips:                  Appears normal         Kidneys:                Appear normal  Palate:                Previously seen        Bladder:                Appears normal  Thoracic:              Appears normal         Spine:                  Previously seen  Heart:                 Appears normal         Upper Extremities:      Previously seen                         (4CH, axis, and                         situs)  RVOT:                  Previously seen        Lower Extremities:      Previously seen  Other:  Fetus appears to be a female. Nasal bone, Heels/feet, open hands/5th          digits, VC, 3VV and 3VTV previously visualized. ---------------------------------------------------------------------- Cervix Uterus Adnexa  Cervix  Normal appearance by transabdominal scan.  Uterus  visualized  Right Ovary  Not visualized.  Left Ovary  Not visualized.  Cul De Sac  No free fluid seen.  Adnexa  No abnormality visualized. ---------------------------------------------------------------------- Impression  Pregestational diabetes.  Patient takes insulin .  Chronic hypertension.  Well-controlled without  antihypertensives.  Hypothyroidism.  Recent thyroid function test showed  euthyroid status.  Blood pressure today at her office  is 127/73 mmHg.  Amniotic fluid is normal and good fetal activity is seen .Fetal  growth is appropriate for gestational age . ---------------------------------------------------------------------- Recommendations  -An appointment was made for her to return in 4 weeks for  fetal growth assessment.  -Weekly BPP from 32 weeks' gestation till delivery. ----------------------------------------------------------------------                  Tama High, MD Electronically Signed Final Report   01/21/2021  04:55 pm ----------------------------------------------------------------------   MAU Course  Procedures  MDM -pt reports possible COVID exposure at work, asymptomatic -COVID/flu: negative  -DFM at home, with return of normal movement on arrival to MAU -clicker given to patient to record fetal movement, patient pressed button 17 times in 60 minutes -EFM: reactive       -baseline: 145/150       -variability: moderate       -accels: present, 15x15       -decels: few variables       -TOCO: irritability  -N/V resolved, pt able to eat and drink while in MAU -UA: WNL  -expired glucose monitor, pt has not monitored sugars for past two weeks -random CBG: 105  -RUQ pain, reproducible on exam -CBC: no abnormalities requiring treatment -CMP: WNL -RUQ [place]: WNL  -consulted with Dr. Harolyn Rutherford, pt OK to be discharged to home -pt discharged to home in stable condition   Orders Placed This Encounter  Procedures   Resp Panel by RT-PCR (Flu A&B, Covid) Nasopharyngeal Swab    Standing Status:   Standing    Number of Occurrences:   1    Order Specific Question:   Is this test for diagnosis or screening    Answer:   Diagnosis of ill patient    Order Specific Question:   Symptomatic for COVID-19 as defined by CDC    Answer:   Yes    Order Specific Question:   Date of Symptom Onset    Answer:   02/04/2021    Order Specific Question:   Hospitalized for COVID-19    Answer:   Yes    Order Specific Question:   Admitted to ICU for COVID-19    Answer:   No    Order Specific Question:   Previously tested for COVID-19    Answer:   Yes    Order Specific Question:   Resident in a congregate (group) care setting    Answer:   No    Order Specific Question:   Employed in healthcare setting    Answer:   No    Order Specific Question:   Pregnant    Answer:   Yes    Order Specific Question:   Has patient completed COVID vaccination(s) (2 doses of Pfizer/Moderna 1 dose of [employer])    Answer:    Unknown   SARS CORONAVIRUS 2 (TAT 6-24 HRS) Nasopharyngeal Nasopharyngeal Swab    Standing Status:   Standing    Number of Occurrences:   1    Order Specific Question:   Is this test for diagnosis or screening    Answer:   Screening    Order Specific Question:   Symptomatic for COVID-19 as defined by CDC    Answer:   No    Order Specific Question:   Hospitalized for COVID-19    Answer:   No    Order Specific Question:   Admitted to ICU for COVID-19    Answer:   No    Order  Specific Question:   Previously tested for COVID-19    Answer:   Yes    Order Specific Question:   Resident in a congregate (group) care setting    Answer:   No    Order Specific Question:   Employed in healthcare setting    Answer:   No    Order Specific Question:   Pregnant    Answer:   Yes    Order Specific Question:   Has patient completed COVID vaccination(s) (2 doses of Pfizer/Moderna 1 dose of [employer])    Answer:   Unknown   US Abdomen Limited RUQ (LIVER/GB)    Standing Status:   Standing    Number of Occurrences:   1    Order Specific Question:   Symptom/Reason for Exam    Answer:   RUQ pain [[id number]]   Urinalysis, Routine w reflex microscopic    Standing Status:   Standing    Number of Occurrences:   1   CBC with Differential/Platelet    Standing Status:   Standing    Number of Occurrences:   1   Comprehensive metabolic panel    Standing Status:   Standing    Number of Occurrences:   1   Glucose, capillary    Standing Status:   Standing    Number of Occurrences:   1   Airborne and Contact precautions    Standing Status:   Standing    Number of Occurrences:   1   Insert peripheral IV    Standing Status:   Standing    Number of Occurrences:   1   No orders of the defined types were placed in this encounter.  Assessment and Plan   1. Close exposure to COVID-19 virus   2. RUQ pain   3. Nausea and vomiting in pregnancy   4. Pre-existing type 2 diabetes mellitus during pregnancy in  third trimester   5. Decreased fetal movements in third trimester, single or unspecified fetus     Allergies as of 02/04/2021   No Known Allergies      Medication List     TAKE these medications    acetaminophen 500 MG tablet Commonly known as: TYLENOL Take 500 mg by mouth every 6 (six) hours as needed.   cyclobenzaprine 10 MG tablet Commonly known as: FLEXERIL Take 1 tablet (10 mg total) by mouth 2 (two) times daily as needed for muscle spasms.   Dexcom G6 Receiver [place] use as directed.   Dexcom G6 Sensor Misc 1 Device by Does not apply route as directed.   Dexcom G6 Transmitter Misc 1 Device by Does not apply route as directed.   HumuLIN N 100 UNIT/ML injection Generic drug: insulin NPH Human INJECT 0.13 MLS (13 UNITS TOTAL) INTO THE SKIN DAILY WITH BREAKFAST.   hydrOXYzine 25 MG tablet Commonly known as: ATARAX/VISTARIL TAKE 1 TABLET (25 MG TOTAL) BY MOUTH AT BEDTIME.   omeprazole 20 MG capsule Commonly known as: PRILOSEC TAKE 1 CAPSULE (20 MG TOTAL) BY MOUTH IN THE MORNING AND AT BEDTIME.   Pen Needles 31G X 8 MM Misc Inject 1 application into the skin at bedtime.   polyethylene glycol 17 g packet Commonly known as: MIRALAX / GLYCOLAX Take 17 g by mouth daily.   prenatal multivitamin Tabs tablet Take 1 tablet by mouth daily at 12 noon.   promethazine 25 MG tablet Commonly known as: PHENERGAN Take 1 tablet (25 mg total) by mouth at bedtime as  needed for nausea or vomiting.   sertraline 50 MG tablet Commonly known as: ZOLOFT TAKE 1 TABLET (50 MG TOTAL) BY MOUTH DAILY.   valACYclovir 500 MG tablet Commonly known as: Valtrex Take 1 tablet (500 mg total) by mouth daily.        -return MAU precautions given -pt discharged to home in stable condition  Elmyra Ricks E Darious Rehman 02/04/2021, 1:56 PM

## 2021-02-07 ENCOUNTER — Other Ambulatory Visit: Payer: Self-pay

## 2021-02-07 ENCOUNTER — Ambulatory Visit: Payer: Medicaid Other | Admitting: Physical Therapy

## 2021-02-07 DIAGNOSIS — M6281 Muscle weakness (generalized): Secondary | ICD-10-CM | POA: Diagnosis not present

## 2021-02-07 DIAGNOSIS — G8929 Other chronic pain: Secondary | ICD-10-CM | POA: Diagnosis not present

## 2021-02-07 DIAGNOSIS — M545 Low back pain, unspecified: Secondary | ICD-10-CM | POA: Diagnosis not present

## 2021-02-07 NOTE — Therapy (Addendum)
[facility] [address] [place], [place], [address] Phone: [phone]   Fax:  [phone]  Physical Therapy Treatment  Patient Details  Name: Tamara Reyes MRN: [id number] Date of Birth: 01/17/1996 Referring Provider (PT): Woodroe Mode, MD   Encounter Date: 02/07/2021   PT End of Session - 02/07/21 1542     Visit Number 3    Date for PT Re-Evaluation 03/02/21    Authorization Type medicaid prep    PT Start Time 1537    PT Stop Time 1620    PT Time Calculation (min) 43 min    Activity Tolerance Patient tolerated treatment well    Behavior During Therapy [facility] for tasks assessed/performed             Past Medical History:  Diagnosis Date   Asthma    COVID-19 08/16/2020   DM (diabetes mellitus), type 2 ([place])    Fibromyalgia    Genital herpes    History of marijuana use 2021   Hypercholesteremia    Hypertension    Nexplanon insertion 08/26/2013   Lot #[id number] Exp 02/2016    PCOS (polycystic ovarian syndrome)     Past Surgical History:  Procedure Laterality Date   abscess removal      TONSILLECTOMY     TONSILLECTOMY AND ADENOIDECTOMY     tonsils only    There were no vitals filed for this visit.   Subjective Assessment - 02/07/21 1538     Subjective Pt is having a lot more pain with fibro and lying on Rt side hurts under the ribcage.  Pt had liver and galbladder was checked and told that was okay.  Pt states she is stiff and they are looking in to changing the muscle relaxer medicine    Pertinent History hypertension during pregnancy, type 2 DM, PCOS, fibro    Patient Stated Goals just less pain    Currently in Pain? Yes    Pain Score 10-Worst pain ever    Pain Location Generalized    Pain Descriptors / Indicators Tightness    Pain Type Acute pain    Multiple Pain Sites No                               [facility] Adult PT Treatment/Exercise - 02/07/21 0001        Self-Care   Self-Care Other Self-Care Comments    Other Self-Care Comments  self massage with tennis ball and foam noodle      Manual Therapy   Soft tissue mobilization lumbar and gluteals, addaday to Rt gluteals; adductors, IT bands, quads bil                         PT Long Term Goals - 02/07/21 1625       PT LONG TERM GOAL #1   Title Pt will report 25% less pain in typical day    Baseline 10/10 due to fibromyalgia    Status On-going      PT LONG TERM GOAL #2   Title Pt will be able to be active and have a plan she can do even on days that she is having more pain    Status On-going      PT LONG TERM GOAL #3   Title Pt will be ind with advanced HEP as part of a healthy pregnancy    Status On-going  Plan - 02/07/21 1615     Clinical Impression Statement Pt had good response from STM.  Pt was given instructions to continue with self massage in the ways described above.  Pt was able to use the bathroom after STM during treatment today.  Pt will benefit from skilled PT to continue working on pain management through pregnancy.    PT Treatment/Interventions ADLs/Self Care Home Management;Biofeedback;Cryotherapy;Electrical Stimulation;Neuromuscular re-education;Therapeutic exercise;Therapeutic activities;Patient/family education;Manual techniques;Dry needling;Passive range of motion;Taping    PT Next Visit Plan f/u on doing self massage with tennis ball and foam noodle    PT Home Exercise Plan HEP and self massage    Consulted and Agree with Plan of Care Patient             Patient will benefit from skilled therapeutic intervention in order to improve the following deficits and impairments:  Abnormal gait, Pain, Increased fascial restricitons, Postural dysfunction, Decreased strength, Impaired flexibility  Visit Diagnosis: Muscle weakness (generalized)  Chronic low back pain, unspecified back pain laterality, unspecified whether  sciatica present     Problem List Patient Active Problem List   Diagnosis Date Noted   Marijuana use 12/28/2020   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 [place], [place], [address] Phone: [phone]   Fax:  [phone]  Name: Tamara Reyes MRN: [id number] Date of Birth: Oct 05, 1995   PHYSICAL THERAPY DISCHARGE SUMMARY  Visits from Start of Care: 3  Current functional level related to goals / functional outcomes:  See above goals  Remaining deficits: See above   Education / Equipment: HEP  Patient agrees to discharge. Patient goals were not met. Patient is being discharged due to not returning since the last visit.  Gustavus Bryant, PT 02/23/21 4:05 PM

## 2021-02-08 ENCOUNTER — Ambulatory Visit (INDEPENDENT_AMBULATORY_CARE_PROVIDER_SITE_OTHER): Payer: Medicaid Other | Admitting: Family Medicine

## 2021-02-08 VITALS — BP 152/89 | HR 80 | Wt 195.8 lb

## 2021-02-08 DIAGNOSIS — E0590 Thyrotoxicosis, unspecified without thyrotoxic crisis or storm: Secondary | ICD-10-CM

## 2021-02-08 DIAGNOSIS — Z1331 Encounter for screening for depression: Secondary | ICD-10-CM

## 2021-02-08 DIAGNOSIS — Z5941 Food insecurity: Secondary | ICD-10-CM

## 2021-02-08 DIAGNOSIS — O99012 Anemia complicating pregnancy, second trimester: Secondary | ICD-10-CM

## 2021-02-08 DIAGNOSIS — O24113 Pre-existing diabetes mellitus, type 2, in pregnancy, third trimester: Secondary | ICD-10-CM

## 2021-02-08 DIAGNOSIS — Z8739 Personal history of other diseases of the musculoskeletal system and connective tissue: Secondary | ICD-10-CM

## 2021-02-08 DIAGNOSIS — O0990 Supervision of high risk pregnancy, unspecified, unspecified trimester: Secondary | ICD-10-CM

## 2021-02-08 DIAGNOSIS — O10919 Unspecified pre-existing hypertension complicating pregnancy, unspecified trimester: Secondary | ICD-10-CM

## 2021-02-08 DIAGNOSIS — K219 Gastro-esophageal reflux disease without esophagitis: Secondary | ICD-10-CM

## 2021-02-08 MED ORDER — NIFEDIPINE ER OSMOTIC RELEASE 30 MG PO TB24
30.0000 mg | ORAL_TABLET | Freq: Every day | ORAL | 5 refills | Status: DC
  Filled 2021-02-08: qty 90, 90d supply, fill #0
  Filled 2021-03-18: qty 30, 30d supply, fill #0

## 2021-02-08 MED ORDER — OMEPRAZOLE 20 MG PO CPDR
DELAYED_RELEASE_CAPSULE | ORAL | 3 refills | Status: DC
  Filled 2021-03-18: qty 30, 30d supply, fill #0

## 2021-02-08 NOTE — Patient Instructions (Signed)

## 2021-02-08 NOTE — Progress Notes (Signed)
Subjective:  Tamara Reyes is a 25 y.o. G1P0000 at [redacted]w[redacted]d being seen today for ongoing prenatal care.  She is currently monitored for the following issues for this high-risk pregnancy and has Hyperlipidemia; Chronic hypertension during pregnancy, antepartum; Pre-existing type 2 diabetes mellitus in pregnancy; Supervision of high risk pregnancy, antepartum; Hyperthyroidism; Alpha thalassemia silent carrier; H/O fibromyalgia; HSV-2 infection; Anemia during pregnancy in second trimester; History of COVID-19; and Marijuana use on their problem list.  Patient reports nausea and fibromyalgia pain .  Contractions: Not present. Vag. Bleeding: None.  Movement: Present. Denies leaking of fluid.   The following portions of the patient's history were reviewed and updated as appropriate: allergies, current medications, past family history, past medical history, past social history, past surgical history and problem list. Problem list updated.  Objective:   Vitals:   02/08/21 1622  BP: (!) 152/89  Pulse: 80  Weight: 195 lb 12.8 oz (88.8 kg)    Fetal Status: Fetal Heart Rate (bpm): 153   Movement: Present     General:  Alert, oriented and cooperative. Patient is in no acute distress.  Skin: Skin is warm and dry. No rash noted.   Cardiovascular: Normal heart rate noted  Respiratory: Normal respiratory effort, no problems with respiration noted  Abdomen: Soft, gravid, appropriate for gestational age. Pain/Pressure: Present     Pelvic: Vag. Bleeding: None     Cervical exam deferred        Extremities: Normal range of motion.  Edema: Trace  Mental Status: Normal mood and affect. Normal behavior. Normal judgment and thought content.   Urinalysis:      Assessment and Plan:  Pregnancy: G1P0000 at [redacted]w[redacted]d  1. Positive depression screening  - Ambulatory referral to [place]  2. Food insecurity  - AMBULATORY REFERRAL TO [facility]  3. Supervision of high risk pregnancy,  antepartum BP elevated, see below FHR normal  Very concerned regarding MJ use and both her anxiety and fibromyalgia. She is unable to take regular treatments which have helped significantly with both issues and feels like THC products help her, no longer smoking MJ. At the same time she is very concerned about DCF involvement after delivery. We discussed that DCF referral is inevitable but unlikely to result in loss of custody. Also discussed studies showing increased rates of neuropsychiatric/behavioral disturbances in children born to heavy MJ users. I recommended she discontinue, but if she continues to use the lowest amount possible.   4. Pre-existing type 2 diabetes mellitus during pregnancy in third trimester Has not been checking sugars of late Still taking NPH 13u daily Discussed importance of controlling DM to avoid complications, specifically macrosomia (with attendant risk of dystocia, increased likelihood of cesarean, etc.), stillbirth, and postpartum NICU admission She has CGM Agrees to adhere more closely to monitoring to see if we need to increase her regimen Will need to start antenatal testing with next visit Last [place] 6/10 with normal EFW/AFI, follow up scheduled for 02/18/2021  5. Chronic hypertension during pregnancy, antepartum Initial BP elevated, remained so on recheck Discussed recent CHAP trial showing benefit of more aggressive BP management  She is amenable to start Nifedipine 30 mg XL daily, rx sent Following w MFM  6. Hyperthyroidism Normal thyroid studies with 3rd trimester labs  7. H/O fibromyalgia See above  8. Anemia during pregnancy in second trimester Most recent hgb 11.2 on 02/04/2021  9. Gastroesophageal reflux disease without esophagitis PPI refill given - omeprazole (PRILOSEC) 20 MG capsule; TAKE 1  CAPSULE (20 MG TOTAL) BY MOUTH IN THE MORNING AND AT BEDTIME.  Dispense: 30 capsule; Refill: 3  Preterm labor symptoms and general obstetric precautions  including but not limited to vaginal bleeding, contractions, leaking of fluid and fetal movement were reviewed in detail with the patient. Please refer to After Visit Summary for other counseling recommendations.  Return in 2 weeks (on 02/22/2021) for [facility], ob visit, needs MD.   Clarnce Flock, MD

## 2021-02-08 NOTE — Progress Notes (Signed)
Dexcom  Real bad Acid Reflux/ Fibromyalgia has been flaring up really bad.  PHQ-9, scored 14 GAD 7, 17

## 2021-02-09 ENCOUNTER — Other Ambulatory Visit: Payer: Self-pay

## 2021-02-10 ENCOUNTER — Other Ambulatory Visit: Payer: Self-pay

## 2021-02-16 ENCOUNTER — Telehealth: Payer: Self-pay | Admitting: Physical Therapy

## 2021-02-16 ENCOUNTER — Ambulatory Visit: Payer: Medicaid Other | Attending: Obstetrics & Gynecology | Admitting: Physical Therapy

## 2021-02-16 NOTE — Telephone Encounter (Signed)
Patient did not show for appointment.  Patient was called and PT left message to please call us back. Reminded of now show policy  [employer], PT 02/16/21 4:00 PM

## 2021-02-17 ENCOUNTER — Other Ambulatory Visit: Payer: Self-pay

## 2021-02-18 ENCOUNTER — Ambulatory Visit: Payer: Medicaid Other | Admitting: *Deleted

## 2021-02-18 ENCOUNTER — Other Ambulatory Visit: Payer: Self-pay

## 2021-02-18 ENCOUNTER — Encounter: Payer: Self-pay | Admitting: *Deleted

## 2021-02-18 ENCOUNTER — Other Ambulatory Visit: Payer: Self-pay | Admitting: *Deleted

## 2021-02-18 ENCOUNTER — Ambulatory Visit: Payer: Medicaid Other | Attending: Obstetrics and Gynecology

## 2021-02-18 VITALS — BP 127/82 | HR 90

## 2021-02-18 DIAGNOSIS — O10013 Pre-existing essential hypertension complicating pregnancy, third trimester: Secondary | ICD-10-CM

## 2021-02-18 DIAGNOSIS — O24113 Pre-existing diabetes mellitus, type 2, in pregnancy, third trimester: Secondary | ICD-10-CM

## 2021-02-18 DIAGNOSIS — I10 Essential (primary) hypertension: Secondary | ICD-10-CM

## 2021-02-18 DIAGNOSIS — E119 Type 2 diabetes mellitus without complications: Secondary | ICD-10-CM

## 2021-02-18 DIAGNOSIS — Z148 Genetic carrier of other disease: Secondary | ICD-10-CM | POA: Diagnosis not present

## 2021-02-18 DIAGNOSIS — O10913 Unspecified pre-existing hypertension complicating pregnancy, third trimester: Secondary | ICD-10-CM | POA: Insufficient documentation

## 2021-02-18 DIAGNOSIS — D259 Leiomyoma of uterus, unspecified: Secondary | ICD-10-CM | POA: Diagnosis not present

## 2021-02-18 DIAGNOSIS — Z3A32 32 weeks gestation of pregnancy: Secondary | ICD-10-CM

## 2021-02-18 DIAGNOSIS — O3413 Maternal care for benign tumor of corpus uteri, third trimester: Secondary | ICD-10-CM

## 2021-02-18 DIAGNOSIS — O10919 Unspecified pre-existing hypertension complicating pregnancy, unspecified trimester: Secondary | ICD-10-CM

## 2021-02-23 ENCOUNTER — Ambulatory Visit: Payer: Medicaid Other | Admitting: Physical Therapy

## 2021-02-23 ENCOUNTER — Encounter: Payer: Medicaid Other | Admitting: Obstetrics and Gynecology

## 2021-02-28 ENCOUNTER — Other Ambulatory Visit: Payer: Medicaid Other

## 2021-03-02 ENCOUNTER — Encounter: Payer: Medicaid Other | Admitting: Physical Therapy

## 2021-03-02 DIAGNOSIS — Q211 Atrial septal defect: Secondary | ICD-10-CM | POA: Diagnosis not present

## 2021-03-09 ENCOUNTER — Ambulatory Visit (INDEPENDENT_AMBULATORY_CARE_PROVIDER_SITE_OTHER): Payer: Medicaid Other

## 2021-03-09 ENCOUNTER — Ambulatory Visit (INDEPENDENT_AMBULATORY_CARE_PROVIDER_SITE_OTHER): Payer: Medicaid Other | Admitting: Obstetrics and Gynecology

## 2021-03-09 ENCOUNTER — Encounter: Payer: Self-pay | Admitting: Obstetrics and Gynecology

## 2021-03-09 ENCOUNTER — Other Ambulatory Visit: Payer: Self-pay | Admitting: Obstetrics and Gynecology

## 2021-03-09 ENCOUNTER — Ambulatory Visit: Payer: Medicaid Other | Admitting: *Deleted

## 2021-03-09 ENCOUNTER — Other Ambulatory Visit: Payer: Self-pay

## 2021-03-09 VITALS — BP 112/70 | HR 75 | Wt 195.9 lb

## 2021-03-09 DIAGNOSIS — Z3A34 34 weeks gestation of pregnancy: Secondary | ICD-10-CM | POA: Diagnosis not present

## 2021-03-09 DIAGNOSIS — O10919 Unspecified pre-existing hypertension complicating pregnancy, unspecified trimester: Secondary | ICD-10-CM

## 2021-03-09 DIAGNOSIS — O24113 Pre-existing diabetes mellitus, type 2, in pregnancy, third trimester: Secondary | ICD-10-CM

## 2021-03-09 DIAGNOSIS — O24119 Pre-existing diabetes mellitus, type 2, in pregnancy, unspecified trimester: Secondary | ICD-10-CM

## 2021-03-09 DIAGNOSIS — O0990 Supervision of high risk pregnancy, unspecified, unspecified trimester: Secondary | ICD-10-CM

## 2021-03-09 MED ORDER — ALBUTEROL SULFATE HFA 108 (90 BASE) MCG/ACT IN AERS
2.0000 | INHALATION_SPRAY | Freq: Four times a day (QID) | RESPIRATORY_TRACT | 1 refills | Status: AC | PRN
  Filled 2021-03-09 – 2021-03-18 (×2): qty 8.5, 25d supply, fill #0

## 2021-03-09 MED ORDER — ALBUTEROL SULFATE HFA 108 (90 BASE) MCG/ACT IN AERS
2.0000 | INHALATION_SPRAY | Freq: Four times a day (QID) | RESPIRATORY_TRACT | 1 refills | Status: DC | PRN
  Filled 2021-03-09: qty 8, fill #0

## 2021-03-09 NOTE — Progress Notes (Signed)
   PRENATAL VISIT NOTE  Subjective:  Tamara Reyes is a 25 y.o. G1P0000 at 53w5dbeing seen today for ongoing prenatal care.  She is currently monitored for the following issues for this high-risk pregnancy and has Hyperlipidemia; Chronic hypertension during pregnancy, antepartum; Pre-existing type 2 diabetes mellitus in pregnancy; Supervision of high risk pregnancy, antepartum; Hyperthyroidism; Alpha thalassemia silent carrier; H/O fibromyalgia; HSV-2 infection; Anemia during pregnancy in second trimester; History of COVID-19; and Marijuana use on their problem list.  Patient reports no complaints.  Contractions: Irritability. Vag. Bleeding: None.  Movement: Present. Denies leaking of fluid.   The following portions of the patient's history were reviewed and updated as appropriate: allergies, current medications, past family history, past medical history, past social history, past surgical history and problem list.   Objective:   Vitals:   03/09/21 1135  BP: 112/70  Pulse: 75  Weight: 195 lb 14.4 oz (88.9 kg)    Fetal Status: Fetal Heart Rate (bpm): 148   Movement: Present     General:  Alert, oriented and cooperative. Patient is in no acute distress.  Skin: Skin is warm and dry. No rash noted.   Cardiovascular: Normal heart rate noted  Respiratory: Normal respiratory effort, no problems with respiration noted  Abdomen: Soft, gravid, appropriate for gestational age.  Pain/Pressure: Absent     Pelvic: Cervical exam deferred        Extremities: Normal range of motion.  Edema: None  Mental Status: Normal mood and affect. Normal behavior. Normal judgment and thought content.   Assessment and Plan:  Pregnancy: G1P0000 at 393w5d. Supervision of high risk pregnancy, antepartum Patient is doing well without complaints Patient is undecided on pediatrician and contraception  2. Chronic hypertension during pregnancy, antepartum Normotensive without medication Patient discontinue procardia 2  days ago  3. Pre-existing type 2 diabetes mellitus during pregnancy, antepartum Patient did not bring CBG log and Dexcom needs to be replaced She reports fasting as high as 95 and pp as high as 140 with most values being in the 110's. She admits to higher readings with dietary indiscretions Antenatal testing today Growth ultrasound scheduled  Preterm labor symptoms and general obstetric precautions including but not limited to vaginal bleeding, contractions, leaking of fluid and fetal movement were reviewed in detail with the patient. Please refer to After Visit Summary for other counseling recommendations.   Return in about 1 week (around 03/16/2021) for in person, ROB, High risk.  Future Appointments  Date Time Provider [place]7/27/2022  1:15 PM CoMora BellmanMD [facility]2020  1:30 PM [facility] NURSE [facility]/12/2020  1:45 PM [facility] US5 [facility]    PeMora BellmanMD

## 2021-03-15 ENCOUNTER — Other Ambulatory Visit: Payer: Self-pay

## 2021-03-15 ENCOUNTER — Inpatient Hospital Stay (HOSPITAL_COMMUNITY)
Admission: AD | Admit: 2021-03-15 | Discharge: 2021-03-15 | Disposition: A | Discharge status: Home / Self Care | Payer: Medicaid Other | Attending: Obstetrics & Gynecology | Admitting: Obstetrics & Gynecology

## 2021-03-15 ENCOUNTER — Other Ambulatory Visit: Payer: Self-pay | Admitting: Family Medicine

## 2021-03-15 ENCOUNTER — Encounter (HOSPITAL_COMMUNITY): Payer: Self-pay | Admitting: Obstetrics & Gynecology

## 2021-03-15 DIAGNOSIS — O24113 Pre-existing diabetes mellitus, type 2, in pregnancy, third trimester: Secondary | ICD-10-CM | POA: Insufficient documentation

## 2021-03-15 DIAGNOSIS — Z87891 Personal history of nicotine dependence: Secondary | ICD-10-CM | POA: Diagnosis not present

## 2021-03-15 DIAGNOSIS — O10913 Unspecified pre-existing hypertension complicating pregnancy, third trimester: Secondary | ICD-10-CM | POA: Insufficient documentation

## 2021-03-15 DIAGNOSIS — K0381 Cracked tooth: Secondary | ICD-10-CM | POA: Insufficient documentation

## 2021-03-15 DIAGNOSIS — O99613 Diseases of the digestive system complicating pregnancy, third trimester: Secondary | ICD-10-CM | POA: Insufficient documentation

## 2021-03-15 DIAGNOSIS — Z8616 Personal history of COVID-19: Secondary | ICD-10-CM | POA: Diagnosis not present

## 2021-03-15 DIAGNOSIS — K5900 Constipation, unspecified: Secondary | ICD-10-CM | POA: Insufficient documentation

## 2021-03-15 DIAGNOSIS — O26893 Other specified pregnancy related conditions, third trimester: Secondary | ICD-10-CM | POA: Insufficient documentation

## 2021-03-15 DIAGNOSIS — K029 Dental caries, unspecified: Secondary | ICD-10-CM | POA: Insufficient documentation

## 2021-03-15 DIAGNOSIS — K047 Periapical abscess without sinus: Secondary | ICD-10-CM | POA: Diagnosis not present

## 2021-03-15 DIAGNOSIS — E1165 Type 2 diabetes mellitus with hyperglycemia: Secondary | ICD-10-CM | POA: Insufficient documentation

## 2021-03-15 DIAGNOSIS — Z79899 Other long term (current) drug therapy: Secondary | ICD-10-CM | POA: Insufficient documentation

## 2021-03-15 DIAGNOSIS — E119 Type 2 diabetes mellitus without complications: Secondary | ICD-10-CM | POA: Diagnosis not present

## 2021-03-15 DIAGNOSIS — N393 Stress incontinence (female) (male): Secondary | ICD-10-CM | POA: Diagnosis not present

## 2021-03-15 DIAGNOSIS — Z3A35 35 weeks gestation of pregnancy: Secondary | ICD-10-CM | POA: Insufficient documentation

## 2021-03-15 DIAGNOSIS — Z794 Long term (current) use of insulin: Secondary | ICD-10-CM | POA: Diagnosis not present

## 2021-03-15 DIAGNOSIS — Z0371 Encounter for suspected problem with amniotic cavity and membrane ruled out: Secondary | ICD-10-CM | POA: Diagnosis not present

## 2021-03-15 DIAGNOSIS — K0889 Other specified disorders of teeth and supporting structures: Secondary | ICD-10-CM | POA: Diagnosis present

## 2021-03-15 LAB — WET PREP, GENITAL
Clue Cells Wet Prep HPF POC: NONE SEEN
Sperm: NONE SEEN
Trich, Wet Prep: NONE SEEN
Yeast Wet Prep HPF POC: NONE SEEN

## 2021-03-15 LAB — URINALYSIS, ROUTINE W REFLEX MICROSCOPIC
Bilirubin Urine: NEGATIVE
Glucose, UA: NEGATIVE mg/dL
Hgb urine dipstick: NEGATIVE
Ketones, ur: NEGATIVE mg/dL
Leukocytes,Ua: NEGATIVE
Nitrite: NEGATIVE
Protein, ur: NEGATIVE mg/dL
Specific Gravity, Urine: 1.009 (ref 1.005–1.030)
pH: 8 (ref 5.0–8.0)

## 2021-03-15 MED ORDER — AMOXICILLIN 500 MG PO CAPS
500.0000 mg | ORAL_CAPSULE | Freq: Three times a day (TID) | ORAL | 0 refills | Status: DC
  Filled 2021-03-15: qty 21, 7d supply, fill #0

## 2021-03-15 NOTE — MAU Provider Note (Signed)
History     CSN: [id number]  Arrival date and time: 03/15/21 1725   Event Date/Time   First Provider Initiated Contact with Patient 03/15/21 1850      Chief Complaint  Patient presents with   Dental Pain   Rupture of Membranes   HPI Ms. Hoss is a 25 y.o. G1P0000 at 52w4dpresenting for tooth pain, a "tapeworm" in her stool, and leaking fluid. She reports the tooth pain has been increasing over the past month and is currently a 10/10. She tried to go to her dentist a couple weeks ago but was told she needed a doctor's note in order to be seen. She has had prior dental work on this tooth in the past and states the filling has recently come off the tooth. She has not been able to eat or drink as much due to the pain.   This morning, she noticed she had something which appeared to be a tapeworm in her stool. The "tapeworm" was attached to the end of the stool but was not within it. She has never noticed this in her stools before. Additionally, her constipation has worsened over the past 2 weeks. She has been struggling with gastroparesis all pregnancy and typically uses MiraLax, but this does not seem to be helping recently. She denies abdominal pain, diarrhea, nausea, or fever. She endorses occasional episodes of vomiting, but this has been present throughout her pregnancy.   She also reports episodes of "leaking" when she coughs, sneezes, or laughs. The leaking does not happen in any other context. She denies abnormal vaginal discharge, bleeding, or dysuria. She endorses normal fetal movement.   OB History     Gravida  1   Para  0   Term  0   Preterm  0   AB  0   Living  0      SAB  0   IAB  0   Ectopic  0   Multiple  0   Live Births              Past Medical History:  Diagnosis Date   Asthma    COVID-19 08/16/2020   [place]    Fibromyalgia    Genital herpes    History of marijuana use 2021   Hypercholesteremia    Hypertension     Nexplanon insertion 08/26/2013   Lot #[id number] Exp 02/2016    PCOS (polycystic ovarian syndrome)     Past Surgical History:  Procedure Laterality Date   abscess removal      TONSILLECTOMY     TONSILLECTOMY AND ADENOIDECTOMY     tonsils only    Family History  Problem Relation Age of Onset   Hypertension Mother    Diabetes Mother    Hypertension Maternal Grandmother    Hypertension Maternal Grandfather    Hypertension Paternal Grandmother    Diabetes Paternal Grandmother     Social History   Tobacco Use   Smoking status: Former    Types: Cigarettes    Quit date: 05/28/2014    Years since quitting: 6.8   Smokeless tobacco: Never  Vaping Use   Vaping Use: Never used  Substance Use Topics   Alcohol use: No   Drug use: Not Currently    Types: Marijuana    Comment: stopped once pregnant    Allergies: No Known Allergies  Medications Prior to Admission  Medication Sig Dispense Refill Last Dose   insulin NPH Human (NOVOLIN  N) 100 UNIT/ML injection INJECT 0.13 MLS (13 UNITS TOTAL) INTO THE SKIN DAILY WITH BREAKFAST. 10 mL 3 03/14/2021   omeprazole (PRILOSEC) 20 MG capsule TAKE 1 CAPSULE (20 MG TOTAL) BY MOUTH IN THE MORNING AND AT BEDTIME. 30 capsule 3 Past Week   Prenatal Vit-Fe Fumarate-FA (PRENATAL MULTIVITAMIN) TABS tablet Take 1 tablet by mouth daily at 12 noon.   Past Week   acetaminophen (TYLENOL) 500 MG tablet Take 500 mg by mouth every 6 (six) hours as needed.      albuterol (VENTOLIN HFA) 108 (90 Base) MCG/ACT inhaler Inhale 2 puffs into the lungs every 6 (six) hours as needed for wheezing or shortness of breath. 8.5 g 1    Continuous Blood Gluc Receiver (DEXCOM G6 RECEIVER) DEVI use as directed. 1 each 0    Continuous Blood Gluc Sensor (DEXCOM G6 SENSOR) MISC 1 Device by Does not apply route as directed. 3 each 33    Continuous Blood Gluc Transmit (DEXCOM G6 TRANSMITTER) MISC 1 Device by Does not apply route as directed. 1 each 3    cyclobenzaprine (FLEXERIL)  10 MG tablet Take 1 tablet (10 mg total) by mouth 2 (two) times daily as needed for muscle spasms. 20 tablet 0    hydrOXYzine (ATARAX/VISTARIL) 25 MG tablet TAKE 1 TABLET (25 MG TOTAL) BY MOUTH AT BEDTIME. 30 tablet 1    Insulin Pen Needle (PEN NEEDLES) 31G X 8 MM MISC Inject 1 application into the skin at bedtime. 100 each 3    NIFEdipine (PROCARDIA-XL/NIFEDICAL-XL) 30 MG 24 hr tablet Take 1 tablet (30 mg total) by mouth daily. 30 tablet 5    polyethylene glycol (MIRALAX / GLYCOLAX) 17 g packet Take 17 g by mouth daily.      promethazine (PHENERGAN) 25 MG tablet Take 1 tablet (25 mg total) by mouth at bedtime as needed for nausea or vomiting. (Patient not taking: No sig reported) 30 tablet 2    sertraline (ZOLOFT) 50 MG tablet TAKE 1 TABLET (50 MG TOTAL) BY MOUTH DAILY. 30 tablet 5    valACYclovir (VALTREX) 500 MG tablet Take 1 tablet (500 mg total) by mouth daily. 30 tablet 12     Review of Systems  Constitutional:  Negative for fatigue.  HENT:  Positive for dental problem (tooth on the right lower side). Negative for facial swelling and sore throat.   Eyes:  Negative for visual disturbance.  Respiratory:  Negative for shortness of breath.   Cardiovascular:  Positive for leg swelling (intermittent bilateral pedal swelling). Negative for chest pain.  Gastrointestinal:  Positive for constipation. Negative for abdominal pain, diarrhea, nausea and vomiting (1 episode every couple of days).       "Tapeworm" in stool this AM  Genitourinary:  Positive for frequency. Negative for dysuria, flank pain, vaginal bleeding and vaginal discharge.  Musculoskeletal:  Positive for back pain (lower back muscular tightness).  Neurological:  Negative for dizziness, light-headedness and headaches.  Psychiatric/Behavioral:  The patient is nervous/anxious (reports anxiety attacks).    Physical Exam   Blood pressure 139/90, pulse 70, temperature 98.7 F (37.1 C), temperature source Oral, resp. rate 15, last  menstrual period 07/09/2020, SpO2 100 %.  Physical Exam Exam conducted with a chaperone present.  Constitutional:      Appearance: Normal appearance.  HENT:     Head: Normocephalic and atraumatic.     Mouth/Throat:     Pharynx: No oropharyngeal exudate or posterior oropharyngeal erythema.      Comments: Decayed tooth. Patient reports  filling has come off. No evidence of abscess or infection in the surrounding gingiva.  Cardiovascular:     Rate and Rhythm: Normal rate and regular rhythm.     Heart sounds: Normal heart sounds.  Pulmonary:     Effort: Pulmonary effort is normal.     Breath sounds: Normal breath sounds.  Abdominal:     Tenderness: There is no abdominal tenderness. There is no right CVA tenderness or left CVA tenderness.     Comments: In the picture the patient provided of the "tapeworm," the substance in question appeared to be an adipose and mucous-like consistency, not as concerning for a worm   Genitourinary:    Labia:        Right: No lesion.        Left: No lesion.      Cervix: Normal.     Comments: No pooling of fluid in the vaginal vault  Musculoskeletal:     Right lower leg: Edema (mild) present.     Left lower leg: Edema (mild) present.  Skin:    General: Skin is warm and dry.  Neurological:     Mental Status: She is alert and oriented to person, place, and time.  Psychiatric:        Mood and Affect: Mood normal.        Behavior: Behavior normal.    MAU Course  Procedures  MDM - Wet prep  - GC/Chlamydia  - Fern   Assessment and Plan  Constipation  - Patient currently takes MiraLax 17g daily, instructed patient to increase to twice daily  - Encouraged patient to continue adequate water intake and high fiber diet   Tooth decay  - Provided a course of amoxicillin as a prophylactic for infection prevention, no abscess observed  - Gave patient a note to see her dentist and encouraged her to follow up with them  - Patient can take Tylenol for  pain management   Stress incontinence  - Performed a pelvic exam without evidence of fluid pooling, fern performed to rule out ROM  - Patient may benefit from pelvic floor therapy after delivery   Chronic hypertension in pregnancy  - Patient stopped taking her Procardia 30 mg about a week ago, has not been monitoring BP at home  - Had some elevations today in the MAU which she attributed to anxiety  - Has appointment tomorrow, BP can be rechecked, discussed possibility of needing to restart Procardia with the patient   Preexisting type 2 diabetes mellitus in pregnancy  - Currently using insulin   [redacted] weeks gestation of pregnancy  Cecilie Lowers 03/15/2021, 6:58 PM

## 2021-03-15 NOTE — MAU Note (Signed)
.  Tamara Reyes is a 25 y.o. at 36w4dhere in MAU reporting: toothache that started last night. She states it is 10/10. She went to the dentist x2 weeks ago but they "couldn't see me because I didn't have a doctors note, and then it came back last night" Patient also reports "peeing on herself for the past two weeks, wants to see if her water is broken" Endorses good fetal movement. No VB. No HA, blurry vision, or epigastric pain.   Pain score: 10 Vitals:   03/15/21 1804  BP: (!) 130/97  Pulse: 86  Resp: 15  Temp: 98.7 F (37.1 C)  SpO2: 100%     FHT:145 Lab orders placed from triage:

## 2021-03-15 NOTE — Discharge Instructions (Addendum)
It was wonderful to see you today!   For your stools, increase your miralax to twice daily. Make sure you have fiber rich foods in your diet with plenty of fluids.   Please discuss your blood pressure and sugar during tomorrow's visit. You should have a blood pressure cuff at home and they can help you with this.   If you have a large leakage of fluid, vaginal bleeding, headache not improved with tylenol, persistent blurred vision, or severe abdominal pain/contractions--please return to the MAU.   You need to see a dentist, these antibiotics may only temporarily help your tooth pain. Please brush your teeth twice daily. Drink fluid throughout the day, room temperature may bother your tooth less. You can take up to 4,'000mg'$  of tylenol for pain relief (500-'1000mg'$  at one time max).

## 2021-03-16 ENCOUNTER — Other Ambulatory Visit: Payer: Self-pay

## 2021-03-16 ENCOUNTER — Ambulatory Visit (INDEPENDENT_AMBULATORY_CARE_PROVIDER_SITE_OTHER): Payer: Medicaid Other | Admitting: Certified Nurse Midwife

## 2021-03-16 VITALS — BP 133/80 | HR 71 | Wt 201.0 lb

## 2021-03-16 DIAGNOSIS — O24113 Pre-existing diabetes mellitus, type 2, in pregnancy, third trimester: Secondary | ICD-10-CM

## 2021-03-16 DIAGNOSIS — O0990 Supervision of high risk pregnancy, unspecified, unspecified trimester: Secondary | ICD-10-CM

## 2021-03-16 DIAGNOSIS — O10913 Unspecified pre-existing hypertension complicating pregnancy, third trimester: Secondary | ICD-10-CM

## 2021-03-16 DIAGNOSIS — Z3A35 35 weeks gestation of pregnancy: Secondary | ICD-10-CM

## 2021-03-16 DIAGNOSIS — K047 Periapical abscess without sinus: Secondary | ICD-10-CM

## 2021-03-16 LAB — GC/CHLAMYDIA PROBE AMP (~~LOC~~) NOT AT ARMC
Chlamydia: NEGATIVE
Comment: NEGATIVE
Comment: NORMAL
Neisseria Gonorrhea: NEGATIVE

## 2021-03-16 NOTE — Progress Notes (Signed)
Attestation of Attending Supervision of Advanced Practitioner (CNM/NP): Evaluation and management procedures were performed by the Advanced Practitioner under my supervision and collaboration.  I have reviewed the Advanced Practitioner's note and chart, and I agree with the management and plan.  Shauntae Reitman 03/16/2021 6:33 AM

## 2021-03-17 NOTE — Progress Notes (Signed)
PRENATAL VISIT NOTE  Subjective:  Tamara Reyes is a 25 y.o. G1P0000 at 38w6dbeing seen today for ongoing prenatal care.  She is currently monitored for the following issues for this high-risk pregnancy and has Hyperlipidemia; Chronic hypertension during pregnancy, antepartum; Pre-existing type 2 diabetes mellitus in pregnancy; Supervision of high risk pregnancy, antepartum; Hyperthyroidism; Alpha thalassemia silent carrier; H/O fibromyalgia; HSV-2 infection; Anemia during pregnancy in second trimester; History of COVID-19; and Marijuana use on their problem list.  Patient reports  no current complaints, was seen in MAU on 03/15/21 for possible parasitic infection but it turned out to be mucus during a bowel movement. No additional issues since discharge. Not consistently taking BP or DM2 meds, stating "a lot of days I don't feel well and don't have a routine so I forget or don't end up taking them." Also is not checking blood sugars right now as she lost the "transmitter" part of her DBelkmonitor. While discussing birth plans and who will attend her birth, pt described having a lot of social anxiety, taking awhile to "warm up" to new people. Likes seeing the same providers whenever possible, understands she also needs to see MDs, prefers Dr. EDione Plover.  Contractions: Irritability. Vag. Bleeding: None.  Movement: Present. Denies leaking of fluid.   The following portions of the patient's history were reviewed and updated as appropriate: allergies, current medications, past family history, past medical history, past social history, past surgical history and problem list.   Objective:   Vitals:   03/16/21 1035  BP: 133/80  Pulse: 71  Weight: 201 lb (91.2 kg)   Fetal Status: Fetal Heart Rate (bpm): 145 Fundal Height: 35 cm Movement: Present     General:  Alert, oriented and cooperative. Patient is in no acute distress.  Skin: Skin is warm and dry. No rash noted.   Cardiovascular: Normal heart rate  noted  Respiratory: Normal respiratory effort, no problems with respiration noted  Abdomen: Soft, gravid, appropriate for gestational age.  Pain/Pressure: Present     Pelvic: Cervical exam deferred        Extremities: Normal range of motion.  Edema: Trace  Mental Status: Normal mood and affect. Normal behavior. Normal judgment and thought content.   Assessment and Plan:  Pregnancy: G1P0000 at [redacted]w[redacted]d. Supervision of high risk pregnancy, antepartum - Doing well, feeling regular and vigorous fetal movement - Copious reassurance given about labor in a hospital setting, pt requested midwives manage her labor and delivery when possible  2. [redacted] weeks gestation of pregnancy - Routine OB care - Anticipatory guidance given re GBS testing at next visit  3. Pre-existing diabetes mellitus type 2 in pregnancy, third trimester - Strongly advised to pick up refills available to her from pharmacy or let usKoreanow if she needs a sensor refilled. - Explained at length the dangers to baby if she continues not testing and has increased blood sugar. Pt expressed understanding. - Strong DFM precautions given  4. Chronic hypertension in pregnancy, third trimester - Strongly advised to get into a routine of taking BP meds and explained dangers of unchecked hypertension including need for earlier IOL if she develops preeclampsia. - Strong preeclampsia precautions given  Preterm labor symptoms and general obstetric precautions including but not limited to vaginal bleeding, contractions, leaking of fluid and fetal movement were reviewed in detail with the patient. Please refer to After Visit Summary for other counseling recommendations.   Return in about 1 week (around 03/23/2021) for IN-PERSON, LOB w  GBS.  Future Appointments  Date Time Provider [place]  03/18/2021  1:30 PM [facility] NURSE [facility]  03/18/2021  1:45 PM [facility] US5 [facility]  03/24/2021  9:15 AM Danielle Rankin [facility]   03/24/2021 10:15 AM [facility] NST [facility]  03/30/2021  2:15 PM [facility] NST [facility]  03/30/2021  3:15 PM Gabriel Carina, CNM [facility]   Gabriel Carina, CNM

## 2021-03-18 ENCOUNTER — Encounter: Payer: Self-pay | Admitting: *Deleted

## 2021-03-18 ENCOUNTER — Other Ambulatory Visit: Payer: Self-pay

## 2021-03-18 ENCOUNTER — Ambulatory Visit: Payer: Medicaid Other | Attending: Obstetrics

## 2021-03-18 ENCOUNTER — Ambulatory Visit: Payer: Medicaid Other | Admitting: *Deleted

## 2021-03-18 VITALS — BP 133/88 | HR 80

## 2021-03-18 DIAGNOSIS — O10013 Pre-existing essential hypertension complicating pregnancy, third trimester: Secondary | ICD-10-CM

## 2021-03-18 DIAGNOSIS — O24113 Pre-existing diabetes mellitus, type 2, in pregnancy, third trimester: Secondary | ICD-10-CM | POA: Diagnosis not present

## 2021-03-18 DIAGNOSIS — Z362 Encounter for other antenatal screening follow-up: Secondary | ICD-10-CM | POA: Diagnosis not present

## 2021-03-18 DIAGNOSIS — O10919 Unspecified pre-existing hypertension complicating pregnancy, unspecified trimester: Secondary | ICD-10-CM | POA: Diagnosis not present

## 2021-03-18 DIAGNOSIS — E119 Type 2 diabetes mellitus without complications: Secondary | ICD-10-CM

## 2021-03-18 DIAGNOSIS — Z3A36 36 weeks gestation of pregnancy: Secondary | ICD-10-CM

## 2021-03-18 DIAGNOSIS — I10 Essential (primary) hypertension: Secondary | ICD-10-CM | POA: Insufficient documentation

## 2021-03-18 MED ORDER — AMOXICILLIN 500 MG PO CAPS
500.0000 mg | ORAL_CAPSULE | Freq: Three times a day (TID) | ORAL | 0 refills | Status: DC
  Filled 2021-03-18: qty 21, 7d supply, fill #0

## 2021-03-18 MED ORDER — DEXCOM G6 RECEIVER DEVI
1.0000 | 0 refills | Status: DC | PRN
Start: 2021-03-18 — End: 2021-04-01
  Filled 2021-03-18: qty 1, 365d supply, fill #0

## 2021-03-18 MED FILL — Insulin NPH (Human) (Isophane) Inj 100 Unit/ML: SUBCUTANEOUS | 28 days supply | Qty: 10 | Fill #0 | Status: AC

## 2021-03-18 MED FILL — Sertraline HCl Tab 50 MG: ORAL | 30 days supply | Qty: 30 | Fill #0 | Status: AC

## 2021-03-21 ENCOUNTER — Other Ambulatory Visit: Payer: Self-pay

## 2021-03-22 ENCOUNTER — Other Ambulatory Visit: Payer: Self-pay

## 2021-03-23 ENCOUNTER — Encounter (HOSPITAL_COMMUNITY): Payer: Self-pay | Admitting: Obstetrics and Gynecology

## 2021-03-23 ENCOUNTER — Inpatient Hospital Stay (HOSPITAL_COMMUNITY)
Admission: AD | Admit: 2021-03-23 | Discharge: 2021-03-23 | Disposition: A | Discharge status: Home / Self Care | Payer: Medicaid Other | Attending: Obstetrics and Gynecology | Admitting: Obstetrics and Gynecology

## 2021-03-23 ENCOUNTER — Other Ambulatory Visit: Payer: Self-pay

## 2021-03-23 DIAGNOSIS — Z9114 Patient's other noncompliance with medication regimen: Secondary | ICD-10-CM | POA: Insufficient documentation

## 2021-03-23 DIAGNOSIS — O10919 Unspecified pre-existing hypertension complicating pregnancy, unspecified trimester: Secondary | ICD-10-CM

## 2021-03-23 DIAGNOSIS — O212 Late vomiting of pregnancy: Secondary | ICD-10-CM | POA: Insufficient documentation

## 2021-03-23 DIAGNOSIS — Z87891 Personal history of nicotine dependence: Secondary | ICD-10-CM | POA: Diagnosis not present

## 2021-03-23 DIAGNOSIS — O26893 Other specified pregnancy related conditions, third trimester: Secondary | ICD-10-CM

## 2021-03-23 DIAGNOSIS — O219 Vomiting of pregnancy, unspecified: Secondary | ICD-10-CM

## 2021-03-23 DIAGNOSIS — Z3A36 36 weeks gestation of pregnancy: Secondary | ICD-10-CM | POA: Insufficient documentation

## 2021-03-23 DIAGNOSIS — R519 Headache, unspecified: Secondary | ICD-10-CM

## 2021-03-23 DIAGNOSIS — R030 Elevated blood-pressure reading, without diagnosis of hypertension: Secondary | ICD-10-CM | POA: Diagnosis present

## 2021-03-23 DIAGNOSIS — O10013 Pre-existing essential hypertension complicating pregnancy, third trimester: Secondary | ICD-10-CM | POA: Diagnosis not present

## 2021-03-23 DIAGNOSIS — O24414 Gestational diabetes mellitus in pregnancy, insulin controlled: Secondary | ICD-10-CM

## 2021-03-23 DIAGNOSIS — Z91148 Patient's other noncompliance with medication regimen for other reason: Secondary | ICD-10-CM

## 2021-03-23 LAB — URINALYSIS, ROUTINE W REFLEX MICROSCOPIC
Bacteria, UA: NONE SEEN
Bilirubin Urine: NEGATIVE
Glucose, UA: NEGATIVE mg/dL
Hgb urine dipstick: NEGATIVE
Ketones, ur: NEGATIVE mg/dL
Leukocytes,Ua: NEGATIVE
Nitrite: NEGATIVE
Protein, ur: 30 mg/dL — AB
Specific Gravity, Urine: 1.02 (ref 1.005–1.030)
pH: 6 (ref 5.0–8.0)

## 2021-03-23 LAB — COMPREHENSIVE METABOLIC PANEL
ALT: 10 U/L (ref 0–44)
AST: 19 U/L (ref 15–41)
Albumin: 2.5 g/dL — ABNORMAL LOW (ref 3.5–5.0)
Alkaline Phosphatase: 102 U/L (ref 38–126)
Anion gap: 8 (ref 5–15)
BUN: 8 mg/dL (ref 6–20)
CO2: 22 mmol/L (ref 22–32)
Calcium: 8.8 mg/dL — ABNORMAL LOW (ref 8.9–10.3)
Chloride: 105 mmol/L (ref 98–111)
Creatinine, Ser: 0.84 mg/dL (ref 0.44–1.00)
GFR, Estimated: >60 mL/min (ref >60)
Glucose, Bld: 112 mg/dL — ABNORMAL HIGH (ref 70–99)
Potassium: 3.7 mmol/L (ref 3.5–5.1)
Sodium: 135 mmol/L (ref 135–145)
Total Bilirubin: 0.5 mg/dL (ref 0.3–1.2)
Total Protein: 5.9 g/dL — ABNORMAL LOW (ref 6.5–8.1)

## 2021-03-23 LAB — CBC
HCT: 31.1 % — ABNORMAL LOW (ref 36.0–46.0)
Hemoglobin: 10.4 g/dL — ABNORMAL LOW (ref 12.0–15.0)
MCH: 27 pg (ref 26.0–34.0)
MCHC: 33.4 g/dL (ref 30.0–36.0)
MCV: 80.8 fL (ref 80.0–100.0)
Platelets: 241 10*3/uL (ref 150–400)
RBC: 3.85 MIL/uL — ABNORMAL LOW (ref 3.87–5.11)
RDW: 13.6 % (ref 11.5–15.5)
WBC: 12 10*3/uL — ABNORMAL HIGH (ref 4.0–10.5)
nRBC: 0 % (ref 0.0–0.2)

## 2021-03-23 LAB — PROTEIN / CREATININE RATIO, URINE
Creatinine, Urine: 221.48 mg/dL
Protein Creatinine Ratio: 0.17 mg/mg{Cre} — ABNORMAL HIGH (ref 0.00–0.15)
Total Protein, Urine: 38 mg/dL

## 2021-03-23 MED ORDER — ONDANSETRON 4 MG PO TBDP
4.0000 mg | ORAL_TABLET | Freq: Once | ORAL | Status: AC
  Administered 2021-03-23: 4 mg via ORAL
  Filled 2021-03-23: qty 1

## 2021-03-23 MED ORDER — ACETAMINOPHEN 325 MG PO TABS
650.0000 mg | ORAL_TABLET | Freq: Once | ORAL | Status: AC
  Administered 2021-03-23: 650 mg via ORAL
  Filled 2021-03-23: qty 2

## 2021-03-23 MED ORDER — PROMETHAZINE HCL 25 MG RE SUPP
25.0000 mg | Freq: Four times a day (QID) | RECTAL | 0 refills | Status: DC | PRN
  Filled 2021-03-23: qty 12, 3d supply, fill #0

## 2021-03-23 MED ORDER — NIFEDIPINE 10 MG PO CAPS
10.0000 mg | ORAL_CAPSULE | Freq: Once | ORAL | Status: AC
  Administered 2021-03-23: 10 mg via ORAL
  Filled 2021-03-23: qty 1

## 2021-03-23 MED ORDER — ONDANSETRON HCL 4 MG PO TABS
4.0000 mg | ORAL_TABLET | Freq: Three times a day (TID) | ORAL | 0 refills | Status: DC | PRN
  Filled 2021-03-23: qty 20, 7d supply, fill #0

## 2021-03-23 NOTE — MAU Provider Note (Signed)
Chief Complaint:  Hypertension and Headache   Event Date/Time   First Provider Initiated Contact with Patient 03/23/21 2041     HPI: Tamara Reyes is a 25 y.o. G1P0000 at 53w5dho presents to maternity admissions reporting Elevated blood pressure, nausea, vomiting and headache.  States has been nauseated all week and throughout the pregnancy.  States has only been given oral promethazine, which she vomits up.   Did not take Procardia XL because "I didn't eat today".   States does not have suppositories or zofran for nausea.  . She reports good fetal movement, denies LOF, vaginal bleeding, vaginal itching/burning, urinary symptoms, dizziness, n/v, diarrhea, constipation or fever/chills.  She denies visual changes or RUQ abdominal pain.  Hypertension This is a recurrent problem. The current episode started today. Associated symptoms include headaches. Pertinent negatives include no blurred vision. Treatments tried: Nifedipine, states has not taken it today. Compliance problems: Does not take meds regularly.   Headache  This is a new problem. The current episode started today. The quality of the pain is described as aching. Pertinent negatives include no abdominal pain, back pain, blurred vision, dizziness, fever, muscle aches or visual change. Nothing aggravates the symptoms. She has tried nothing for the symptoms. Her past medical history is significant for hypertension.   RN Note: Tamara Arberis a 25y.o. at 357w5dere in MAU reporting: High blood pressure at home (140-150's/108) She has had a headache since around 2pm. Denies epigastric pain or visual changed. Reports her feet have been swollen. Reports that she was going to come in after work but her significant other had a seizure and had to wait for him to get stable. Blood sugar was 95. Denies contractions, leaking of fluid or vaginal bleeding. +FM  Pain score: 8/10  Past Medical History: Past Medical History:  Diagnosis Date   Asthma     COVID-19 08/16/2020   DM (diabetes mellitus), type 2 ([facility]   Fibromyalgia    Genital herpes    History of marijuana use 2021   Hypercholesteremia    Hypertension    Nexplanon insertion 08/26/2013   Lot #[id number] Exp 02/2016    PCOS (polycystic ovarian syndrome)     Past obstetric history: OB History  Gravida Para Term Preterm AB Living  1 0 0 0 0 0  SAB IAB Ectopic Multiple Live Births  0 0 0 0      # Outcome Date GA Lbr Len/2nd Weight Sex Delivery Anes PTL Lv  1 Current             Past Surgical History: Past Surgical History:  Procedure Laterality Date   abscess removal      TONSILLECTOMY     TONSILLECTOMY AND ADENOIDECTOMY     tonsils only    Family History: Family History  Problem Relation Age of Onset   Hypertension Mother    Diabetes Mother    Hypertension Maternal Grandmother    Hypertension Maternal Grandfather    Hypertension Paternal Grandmother    Diabetes Paternal Grandmother     Social History: Social History   Tobacco Use   Smoking status: Former    Types: Cigarettes    Quit date: 05/28/2014    Years since quitting: 6.8   Smokeless tobacco: Never  Vaping Use   Vaping Use: Never used  Substance Use Topics   Alcohol use: No   Drug use: Not Currently    Types: Marijuana    Comment: stopped once pregnant    Allergies:  No Known Allergies  Meds:  Medications Prior to Admission  Medication Sig Dispense Refill Last Dose   acetaminophen (TYLENOL) 500 MG tablet Take 500 mg by mouth every 6 (six) hours as needed.   03/22/2021   albuterol (VENTOLIN HFA) 108 (90 Base) MCG/ACT inhaler Inhale 2 puffs into the lungs every 6 (six) hours as needed for wheezing or shortness of breath. 8.5 g 1 03/23/2021   amoxicillin (AMOXIL) 500 MG capsule Take 1 capsule (500 mg total) by mouth 3 (three) times daily after meals. 21 capsule 0 03/22/2021   Continuous Blood Gluc Receiver (DEXCOM G6 RECEIVER) DEVI use as directed. 1 each 0 03/23/2021   insulin NPH Human  (NOVOLIN N) 100 UNIT/ML injection INJECT 0.13 MLS (13 UNITS TOTAL) INTO THE SKIN DAILY WITH BREAKFAST. 10 mL 3 03/22/2021   NIFEdipine (PROCARDIA-XL/NIFEDICAL-XL) 30 MG 24 hr tablet Take 1 tablet (30 mg total) by mouth daily. 30 tablet 5 03/22/2021   omeprazole (PRILOSEC) 20 MG capsule TAKE 1 CAPSULE (20 MG TOTAL) BY MOUTH IN THE MORNING AND AT BEDTIME. 30 capsule 3 03/22/2021   polyethylene glycol (MIRALAX / GLYCOLAX) 17 g packet Take 17 g by mouth daily.   03/22/2021   Prenatal Vit-Fe Fumarate-FA (PRENATAL MULTIVITAMIN) TABS tablet Take 1 tablet by mouth daily at 12 noon.   03/23/2021   sertraline (ZOLOFT) 50 MG tablet TAKE 1 TABLET (50 MG TOTAL) BY MOUTH DAILY. 30 tablet 5 03/22/2021   valACYclovir (VALTREX) 500 MG tablet Take 1 tablet (500 mg total) by mouth daily. 30 tablet 12 03/22/2021   Continuous Blood Gluc Sensor (DEXCOM G6 SENSOR) MISC 1 Device by Does not apply route as directed. (Patient not taking: Reported on 03/16/2021) 3 each 33    Continuous Blood Gluc Transmit (DEXCOM G6 TRANSMITTER) MISC 1 Device by Does not apply route as directed. (Patient not taking: Reported on 03/16/2021) 1 each 3    cyclobenzaprine (FLEXERIL) 10 MG tablet Take 1 tablet (10 mg total) by mouth 2 (two) times daily as needed for muscle spasms. 20 tablet 0 Unknown   Insulin Pen Needle (PEN NEEDLES) 31G X 8 MM MISC Inject 1 application into the skin at bedtime. 100 each 3     I have reviewed patient's Past Medical Hx, Surgical Hx, Family Hx, Social Hx, medications and allergies.   ROS:  Review of Systems  Constitutional:  Negative for fever.  Eyes:  Negative for blurred vision.  Gastrointestinal:  Negative for abdominal pain.  Musculoskeletal:  Negative for back pain.  Neurological:  Positive for headaches. Negative for dizziness.  Other systems negative  Physical Exam  Patient Vitals for the past 24 hrs:  BP Temp Temp src Pulse Resp SpO2 Height Weight  03/23/21 2020 (!) 141/85 98.1 F (36.7 C) Oral 81 16 99 % '5\' 8"'$   (1.727 m) 91.9 kg   Vitals:   03/23/21 2110 03/23/21 2118 03/23/21 2129 03/23/21 2131  BP:  128/85 133/84 133/84  Pulse:  88  73  Resp:      Temp:      TempSrc:      SpO2: 98% 97%    Weight:      Height:       Vitals:   03/23/21 2215 03/23/21 2216 03/23/21 2220 03/23/21 2225  BP:  119/70    Pulse:  67    Resp:  16    Temp:      TempSrc:      SpO2: 98%  98% 97%  Weight:  Height:        Constitutional: Well-developed, well-nourished female in no acute distress.  Cardiovascular: normal rate and rhythm Respiratory: normal effort, clear to auscultation bilaterally GI: Abd soft, non-tender, gravid appropriate for gestational age.   No rebound or guarding. MS: Extremities nontender, no edema, normal ROM Neurologic: Alert and oriented x 4.  GU: Neg CVAT.  PELVIC EXAM: deferred   FHT:  Baseline 140 , moderate variability, accelerations present, no decelerations Contractions:  Irregular     Labs: Results for orders placed or performed during the hospital encounter of 03/23/21 (from the past 24 hour(s))  Urinalysis, Routine w reflex microscopic Urine, Clean Catch     Status: Abnormal   Collection Time: 03/23/21  8:41 PM  Result Value Ref Range   Color, Urine YELLOW YELLOW   APPearance HAZY (A) CLEAR   Specific Gravity, Urine 1.020 1.005 - 1.030   pH 6.0 5.0 - 8.0   Glucose, UA NEGATIVE NEGATIVE mg/dL   Hgb urine dipstick NEGATIVE NEGATIVE   Bilirubin Urine NEGATIVE NEGATIVE   Ketones, ur NEGATIVE NEGATIVE mg/dL   Protein, ur 30 (A) NEGATIVE mg/dL   Nitrite NEGATIVE NEGATIVE   Leukocytes,Ua NEGATIVE NEGATIVE   RBC / HPF 0-5 0 - 5 RBC/hpf   WBC, UA 0-5 0 - 5 WBC/hpf   Bacteria, UA NONE SEEN NONE SEEN   Squamous Epithelial / LPF 0-5 0 - 5   Mucus PRESENT    Hyaline Casts, UA PRESENT   Protein / creatinine ratio, urine     Status: Abnormal   Collection Time: 03/23/21  8:41 PM  Result Value Ref Range   Creatinine, Urine 221.48 mg/dL   Total Protein, Urine 38 mg/dL    Protein Creatinine Ratio 0.17 (H) 0.00 - 0.15 mg/mg[Cre]  CBC     Status: Abnormal   Collection Time: 03/23/21  9:18 PM  Result Value Ref Range   WBC 12.0 (H) 4.0 - 10.5 K/uL   RBC 3.85 (L) 3.87 - 5.11 MIL/uL   Hemoglobin 10.4 (L) 12.0 - 15.0 g/dL   HCT 31.1 (L) 36.0 - 46.0 %   MCV 80.8 80.0 - 100.0 fL   MCH 27.0 26.0 - 34.0 pg   MCHC 33.4 30.0 - 36.0 g/dL   RDW 13.6 11.5 - 15.5 %   Platelets 241 150 - 400 K/uL   nRBC 0.0 0.0 - 0.2 %  Comprehensive metabolic panel     Status: Abnormal   Collection Time: 03/23/21  9:18 PM  Result Value Ref Range   Sodium 135 135 - 145 mmol/L   Potassium 3.7 3.5 - 5.1 mmol/L   Chloride 105 98 - 111 mmol/L   CO2 22 22 - 32 mmol/L   Glucose, Bld 112 (H) 70 - 99 mg/dL   BUN 8 6 - 20 mg/dL   Creatinine, Ser 0.84 0.44 - 1.00 mg/dL   Calcium 8.8 (L) 8.9 - 10.3 mg/dL   Total Protein 5.9 (L) 6.5 - 8.1 g/dL   Albumin 2.5 (L) 3.5 - 5.0 g/dL   AST 19 15 - 41 U/L   ALT 10 0 - 44 U/L   Alkaline Phosphatase 102 38 - 126 U/L   Total Bilirubin 0.5 0.3 - 1.2 mg/dL   GFR, Estimated >60 >60 mL/min   Anion gap 8 5 - 15    A/Positive/-- (02/09 1455)  Imaging:    MAU Course/MDM: I have ordered labs and reviewed results. Labs are normal NST reviewed, reactive   Treatments in MAU included Zofran  for nausea then Tylenol which relieved headache mostly, and Nifedipine short acting for BP.    Assessment: Single IUP at 68w5dChronic HTN in pregnancy Medication noncompliance, intermittent use  Nausea/vomiting   Plan: Discharge home Labor precautions and fetal kick counts Rx zofran and Phenergan suppositories for nausea Follow up in Office for prenatal visit tomorrow Encouraged to return if she develops worsening of symptoms, increase in pain, fever, or other concerning symptoms.  Pt stable at time of discharge.  MHansel FeinsteinCNM, MSN Certified Nurse-Midwife 03/23/2021 8:41 PM

## 2021-03-23 NOTE — MAU Note (Signed)
..  Tamara Reyes is a 25 y.o. at 4w5dhere in MAU reporting: High blood pressure at home (140-150's/108) She has had a headache since around 2pm. Denies epigastric pain or visual changed. Reports her feet have been swollen. Reports that she was going to come in after work but her significant other had a seizure and had to wait for him to get stable. Blood sugar was 95. Denies contractions, leaking of fluid or vaginal bleeding. +FM  Pain score: 8/10 Vitals:   03/23/21 2020  BP: (!) 141/85  Pulse: 81  Resp: 16  Temp: 98.1 F (36.7 C)  SpO2: 99%      Lab orders placed from triage: UA

## 2021-03-24 ENCOUNTER — Ambulatory Visit (INDEPENDENT_AMBULATORY_CARE_PROVIDER_SITE_OTHER): Payer: Medicaid Other | Admitting: Medical

## 2021-03-24 ENCOUNTER — Ambulatory Visit (INDEPENDENT_AMBULATORY_CARE_PROVIDER_SITE_OTHER): Payer: Medicaid Other

## 2021-03-24 ENCOUNTER — Ambulatory Visit: Payer: Medicaid Other | Admitting: *Deleted

## 2021-03-24 ENCOUNTER — Other Ambulatory Visit: Payer: Self-pay

## 2021-03-24 ENCOUNTER — Encounter: Payer: Self-pay | Admitting: Medical

## 2021-03-24 VITALS — BP 145/97 | HR 71 | Wt 202.5 lb

## 2021-03-24 DIAGNOSIS — O99012 Anemia complicating pregnancy, second trimester: Secondary | ICD-10-CM

## 2021-03-24 DIAGNOSIS — O24414 Gestational diabetes mellitus in pregnancy, insulin controlled: Secondary | ICD-10-CM

## 2021-03-24 DIAGNOSIS — O10919 Unspecified pre-existing hypertension complicating pregnancy, unspecified trimester: Secondary | ICD-10-CM

## 2021-03-24 DIAGNOSIS — Z3A36 36 weeks gestation of pregnancy: Secondary | ICD-10-CM

## 2021-03-24 DIAGNOSIS — B009 Herpesviral infection, unspecified: Secondary | ICD-10-CM

## 2021-03-24 DIAGNOSIS — O0990 Supervision of high risk pregnancy, unspecified, unspecified trimester: Secondary | ICD-10-CM

## 2021-03-24 DIAGNOSIS — O24113 Pre-existing diabetes mellitus, type 2, in pregnancy, third trimester: Secondary | ICD-10-CM

## 2021-03-24 DIAGNOSIS — D563 Thalassemia minor: Secondary | ICD-10-CM

## 2021-03-24 NOTE — Progress Notes (Signed)
   PRENATAL VISIT NOTE  Subjective:  Tamara Reyes is a 25 y.o. G1P0000 at 21w6dbeing seen today for ongoing prenatal care.  She is currently monitored for the following issues for this high-risk pregnancy and has Hyperlipidemia; Chronic hypertension during pregnancy, antepartum; Pre-existing type 2 diabetes mellitus in pregnancy; Supervision of high risk pregnancy, antepartum; Hyperthyroidism; Alpha thalassemia silent carrier; H/O fibromyalgia; HSV-2 infection; Anemia during pregnancy in second trimester; History of COVID-19; and Marijuana use on their problem list.  Patient reports fatigue.  Contractions: Irritability. Vag. Bleeding: None.  Movement: Present. Denies leaking of fluid.   The following portions of the patient's history were reviewed and updated as appropriate: allergies, current medications, past family history, past medical history, past social history, past surgical history and problem list.   Objective:   Vitals:   03/24/21 0941 03/24/21 0948  BP: (!) 148/102 (!) 145/97  Pulse: 70 71  Weight: 202 lb 8 oz (91.9 kg)     Fetal Status: Fetal Heart Rate (bpm): 146   Movement: Present     General:  Alert, oriented and cooperative. Patient is in no acute distress.  Skin: Skin is warm and dry. No rash noted.   Cardiovascular: Normal heart rate noted  Respiratory: Normal respiratory effort, no problems with respiration noted  Abdomen: Soft, gravid, appropriate for gestational age.  Pain/Pressure: Absent     Pelvic: Cervical exam performed in the presence of a chaperone Dilation: Closed Effacement (%): 50 Station: Ballotable closed, 50%, ballotable  Extremities: Normal range of motion.  Edema: Trace  Mental Status: Normal mood and affect. Normal behavior. Normal judgment and thought content.   Assessment and Plan:  Pregnancy: G1P0000 at 331w6d. Supervision of high risk pregnancy, antepartum - Received update from MFM last week that patient had poor compliance with  medication routine and checking sugars - Recommendation was delivery at 37 weeks - Discussed with patient who requests delay until next week - Discussed patient request with Dr. AnJeanette Capricegiven reassuring BPP/NST today, we can wait until Sunday for IOL. Patient will be scheduled. Strict precautions given for reasons to present to MAU sooner that IOL   2. Pre-existing type 2 diabetes mellitus during pregnancy in third trimester - Patient did not bring log, reports checking sugar TID with fasting 90-95 and PP 120-140 - She has had low appetite   3. Chronic hypertension during pregnancy, antepartum - Reports not taking medicine everyday, had last night in MAU - Pre-e labs were negative last night in MAU   4. HSV-2 infection - has Rx for Valtrex  5. Anemia during pregnancy in second trimester  6. Alpha thalassemia silent carrier  7. [redacted] weeks gestation of pregnancy - Culture, beta strep (group b only) - Negative GC/Chlamydia less than 2 weeks ago  Term labor symptoms and general obstetric precautions including but not limited to vaginal bleeding, contractions, leaking of fluid and fetal movement were reviewed in detail with the patient. Please refer to After Visit Summary for other counseling recommendations.   Return in about 6 weeks (around 05/05/2021) for PP visit, In-Person.  Future Appointments  Date Time Provider [place]9/19/2022  1:35 PM LaJorje GuildNP [facility]  JuKerry HoughPA-C

## 2021-03-25 ENCOUNTER — Telehealth (HOSPITAL_COMMUNITY): Payer: Self-pay | Admitting: *Deleted

## 2021-03-25 ENCOUNTER — Encounter (HOSPITAL_COMMUNITY): Payer: Self-pay | Admitting: *Deleted

## 2021-03-25 ENCOUNTER — Encounter (HOSPITAL_COMMUNITY): Payer: Self-pay

## 2021-03-25 NOTE — Telephone Encounter (Signed)
Preadmission screen  

## 2021-03-27 ENCOUNTER — Other Ambulatory Visit: Payer: Self-pay | Admitting: Advanced Practice Midwife

## 2021-03-28 ENCOUNTER — Inpatient Hospital Stay (HOSPITAL_COMMUNITY): Payer: Medicaid Other

## 2021-03-28 ENCOUNTER — Encounter (HOSPITAL_COMMUNITY): Payer: Self-pay | Admitting: Obstetrics & Gynecology

## 2021-03-28 ENCOUNTER — Other Ambulatory Visit: Payer: Self-pay

## 2021-03-28 ENCOUNTER — Inpatient Hospital Stay (HOSPITAL_COMMUNITY)
Admission: AD | Admit: 2021-03-28 | Discharge: 2021-04-01 | DRG: 806 | Disposition: A | Discharge status: Home / Self Care | Payer: Medicaid Other | Attending: Family Medicine | Admitting: Family Medicine

## 2021-03-28 DIAGNOSIS — F1210 Cannabis abuse, uncomplicated: Secondary | ICD-10-CM | POA: Diagnosis present

## 2021-03-28 DIAGNOSIS — O24119 Pre-existing diabetes mellitus, type 2, in pregnancy, unspecified trimester: Secondary | ICD-10-CM | POA: Diagnosis present

## 2021-03-28 DIAGNOSIS — O9832 Other infections with a predominantly sexual mode of transmission complicating childbirth: Secondary | ICD-10-CM | POA: Diagnosis present

## 2021-03-28 DIAGNOSIS — D563 Thalassemia minor: Secondary | ICD-10-CM | POA: Diagnosis not present

## 2021-03-28 DIAGNOSIS — O2412 Pre-existing diabetes mellitus, type 2, in childbirth: Secondary | ICD-10-CM | POA: Diagnosis not present

## 2021-03-28 DIAGNOSIS — O1002 Pre-existing essential hypertension complicating childbirth: Secondary | ICD-10-CM | POA: Diagnosis present

## 2021-03-28 DIAGNOSIS — A6000 Herpesviral infection of urogenital system, unspecified: Secondary | ICD-10-CM | POA: Diagnosis present

## 2021-03-28 DIAGNOSIS — O10919 Unspecified pre-existing hypertension complicating pregnancy, unspecified trimester: Secondary | ICD-10-CM

## 2021-03-28 DIAGNOSIS — Z87891 Personal history of nicotine dependence: Secondary | ICD-10-CM | POA: Diagnosis not present

## 2021-03-28 DIAGNOSIS — O24424 Gestational diabetes mellitus in childbirth, insulin controlled: Secondary | ICD-10-CM | POA: Diagnosis not present

## 2021-03-28 DIAGNOSIS — Z20822 Contact with and (suspected) exposure to covid-19: Secondary | ICD-10-CM | POA: Diagnosis present

## 2021-03-28 DIAGNOSIS — Z3A37 37 weeks gestation of pregnancy: Secondary | ICD-10-CM | POA: Diagnosis not present

## 2021-03-28 DIAGNOSIS — O24113 Pre-existing diabetes mellitus, type 2, in pregnancy, third trimester: Secondary | ICD-10-CM

## 2021-03-28 DIAGNOSIS — O99324 Drug use complicating childbirth: Secondary | ICD-10-CM | POA: Diagnosis present

## 2021-03-28 DIAGNOSIS — O9952 Diseases of the respiratory system complicating childbirth: Secondary | ICD-10-CM | POA: Diagnosis present

## 2021-03-28 DIAGNOSIS — J45909 Unspecified asthma, uncomplicated: Secondary | ICD-10-CM | POA: Diagnosis present

## 2021-03-28 DIAGNOSIS — O114 Pre-existing hypertension with pre-eclampsia, complicating childbirth: Principal | ICD-10-CM | POA: Diagnosis present

## 2021-03-28 DIAGNOSIS — O119 Pre-existing hypertension with pre-eclampsia, unspecified trimester: Secondary | ICD-10-CM

## 2021-03-28 DIAGNOSIS — Z8616 Personal history of COVID-19: Secondary | ICD-10-CM | POA: Diagnosis not present

## 2021-03-28 DIAGNOSIS — E119 Type 2 diabetes mellitus without complications: Secondary | ICD-10-CM | POA: Diagnosis not present

## 2021-03-28 DIAGNOSIS — B009 Herpesviral infection, unspecified: Secondary | ICD-10-CM | POA: Diagnosis present

## 2021-03-28 DIAGNOSIS — Z794 Long term (current) use of insulin: Secondary | ICD-10-CM | POA: Diagnosis not present

## 2021-03-28 DIAGNOSIS — O1414 Severe pre-eclampsia complicating childbirth: Secondary | ICD-10-CM | POA: Diagnosis not present

## 2021-03-28 DIAGNOSIS — O99824 Streptococcus B carrier state complicating childbirth: Secondary | ICD-10-CM | POA: Diagnosis not present

## 2021-03-28 DIAGNOSIS — O99820 Streptococcus B carrier state complicating pregnancy: Secondary | ICD-10-CM | POA: Diagnosis not present

## 2021-03-28 LAB — COMPREHENSIVE METABOLIC PANEL
ALT: 11 U/L (ref 0–44)
AST: 18 U/L (ref 15–41)
Albumin: 2.6 g/dL — ABNORMAL LOW (ref 3.5–5.0)
Alkaline Phosphatase: 112 U/L (ref 38–126)
Anion gap: 8 (ref 5–15)
BUN: 8 mg/dL (ref 6–20)
CO2: 23 mmol/L (ref 22–32)
Calcium: 8.7 mg/dL — ABNORMAL LOW (ref 8.9–10.3)
Chloride: 104 mmol/L (ref 98–111)
Creatinine, Ser: 0.76 mg/dL (ref 0.44–1.00)
GFR, Estimated: >60 mL/min (ref >60)
Glucose, Bld: 129 mg/dL — ABNORMAL HIGH (ref 70–99)
Potassium: 3.7 mmol/L (ref 3.5–5.1)
Sodium: 135 mmol/L (ref 135–145)
Total Bilirubin: 0.6 mg/dL (ref 0.3–1.2)
Total Protein: 6 g/dL — ABNORMAL LOW (ref 6.5–8.1)

## 2021-03-28 LAB — GLUCOSE, CAPILLARY
Glucose-Capillary: 133 mg/dL — ABNORMAL HIGH (ref 70–99)
Glucose-Capillary: 109 mg/dL — ABNORMAL HIGH (ref 70–99)
Glucose-Capillary: 107 mg/dL — ABNORMAL HIGH (ref 70–99)
Glucose-Capillary: 109 mg/dL — ABNORMAL HIGH (ref 70–99)

## 2021-03-28 LAB — CBC
HCT: 33.3 % — ABNORMAL LOW (ref 36.0–46.0)
Hemoglobin: 10.5 g/dL — ABNORMAL LOW (ref 12.0–15.0)
MCH: 26.4 pg (ref 26.0–34.0)
MCHC: 31.5 g/dL (ref 30.0–36.0)
MCV: 83.7 fL (ref 80.0–100.0)
Platelets: 234 10*3/uL (ref 150–400)
RBC: 3.98 MIL/uL (ref 3.87–5.11)
RDW: 13.5 % (ref 11.5–15.5)
WBC: 9.3 10*3/uL (ref 4.0–10.5)
nRBC: 0 % (ref 0.0–0.2)

## 2021-03-28 LAB — RESP PANEL BY RT-PCR (FLU A&B, COVID) ARPGX2
Influenza A by PCR: NEGATIVE
Influenza B by PCR: NEGATIVE
SARS Coronavirus 2 by RT PCR: NEGATIVE

## 2021-03-28 LAB — GROUP B STREP BY PCR: Group B strep by PCR: POSITIVE — AB

## 2021-03-28 LAB — RPR: RPR Ser Ql: NONREACTIVE

## 2021-03-28 LAB — TYPE AND SCREEN
ABO/RH(D): A POS
Antibody Screen: NEGATIVE

## 2021-03-28 MED ORDER — LIDOCAINE HCL (PF) 1 % IJ SOLN: 30.0000 mL | INTRAMUSCULAR | Status: DC | PRN

## 2021-03-28 MED ORDER — SODIUM CHLORIDE 0.9 % IV SOLN
5.0000 10*6.[IU] | Freq: Once | INTRAVENOUS | Status: AC
  Administered 2021-03-28: 5 10*6.[IU] via INTRAVENOUS
  Filled 2021-03-28: qty 5

## 2021-03-28 MED ORDER — TERBUTALINE SULFATE 1 MG/ML IJ SOLN
0.2500 mg | Freq: Once | INTRAMUSCULAR | Status: DC | PRN
Start: 2021-03-28 — End: 2021-03-30

## 2021-03-28 MED ORDER — MISOPROSTOL 25 MCG QUARTER TABLET
25.0000 ug | ORAL_TABLET | ORAL | Status: DC | PRN
  Administered 2021-03-28 – 2021-03-29 (×3): 25 ug via VAGINAL
  Filled 2021-03-28 (×3): qty 1

## 2021-03-28 MED ORDER — PENICILLIN G POT IN DEXTROSE 60000 UNIT/ML IV SOLN
3.0000 10*6.[IU] | INTRAVENOUS | Status: DC
  Administered 2021-03-28 – 2021-03-29 (×5): 3 10*6.[IU] via INTRAVENOUS
  Filled 2021-03-28 (×5): qty 50

## 2021-03-28 MED ORDER — LACTATED RINGERS IV SOLN
500.0000 mL | INTRAVENOUS | Status: DC | PRN
  Administered 2021-03-28: 500 mL via INTRAVENOUS

## 2021-03-28 MED ORDER — ONDANSETRON HCL 4 MG/2ML IJ SOLN
4.0000 mg | Freq: Four times a day (QID) | INTRAMUSCULAR | Status: DC | PRN
  Administered 2021-03-29: 4 mg via INTRAVENOUS
  Filled 2021-03-28 (×2): qty 2

## 2021-03-28 MED ORDER — ACETAMINOPHEN 325 MG PO TABS: 650.0000 mg | ORAL_TABLET | ORAL | Status: DC | PRN

## 2021-03-28 MED ORDER — OXYTOCIN-SODIUM CHLORIDE 30-0.9 UT/500ML-% IV SOLN
2.5000 [IU]/h | INTRAVENOUS | Status: DC
  Administered 2021-03-29: 2.5 [IU]/h via INTRAVENOUS
  Filled 2021-03-28: qty 500

## 2021-03-28 MED ORDER — OXYTOCIN BOLUS FROM INFUSION
333.0000 mL | Freq: Once | INTRAVENOUS | Status: AC
  Administered 2021-03-29: 333 mL via INTRAVENOUS

## 2021-03-28 MED ORDER — OXYCODONE-ACETAMINOPHEN 5-325 MG PO TABS
1.0000 | ORAL_TABLET | ORAL | Status: DC | PRN
Start: 2021-03-28 — End: 2021-03-30

## 2021-03-28 MED ORDER — OXYCODONE-ACETAMINOPHEN 5-325 MG PO TABS
2.0000 | ORAL_TABLET | ORAL | Status: DC | PRN
Start: 2021-03-28 — End: 2021-03-30

## 2021-03-28 MED ORDER — MISOPROSTOL 50MCG HALF TABLET
50.0000 ug | ORAL_TABLET | ORAL | Status: DC
  Administered 2021-03-28 (×2): 50 ug via BUCCAL
  Filled 2021-03-28 (×2): qty 1

## 2021-03-28 MED ORDER — LACTATED RINGERS IV SOLN: INTRAVENOUS | Status: DC

## 2021-03-28 MED ORDER — VALACYCLOVIR HCL 500 MG PO TABS
500.0000 mg | ORAL_TABLET | Freq: Every day | ORAL | Status: DC
  Administered 2021-03-28: 500 mg via ORAL
  Filled 2021-03-28: qty 1

## 2021-03-28 MED ORDER — SOD CITRATE-CITRIC ACID 500-334 MG/5ML PO SOLN: 30.0000 mL | ORAL | Status: DC | PRN

## 2021-03-28 MED ORDER — NIFEDIPINE ER OSMOTIC RELEASE 30 MG PO TB24
30.0000 mg | ORAL_TABLET | Freq: Every day | ORAL | Status: DC
  Administered 2021-03-28 – 2021-03-29 (×2): 30 mg via ORAL
  Filled 2021-03-28 (×2): qty 1

## 2021-03-28 MED ORDER — VALACYCLOVIR HCL 500 MG PO TABS
500.0000 mg | ORAL_TABLET | Freq: Two times a day (BID) | ORAL | Status: DC
  Administered 2021-03-28 – 2021-04-01 (×6): 500 mg via ORAL
  Filled 2021-03-28 (×6): qty 1

## 2021-03-28 NOTE — Progress Notes (Signed)
Labor Progress Note Tamara Reyes is a 25 y.o. G1P0000 at 63w3dpresented for IOL d/t poorly controlled T2DM and CHTN.   O:  BP (!) 148/102   Pulse 88   Temp 98 F (36.7 C) (Oral)   Resp 18   LMP 07/09/2020  EFM: 145/mod/15x15/no decel   CVE: Dilation: Fingertip Effacement (%): 50 Presentation: Vertex Exam by:: Tamara Reyes  A&P: 25y.o. G1P0000 387w3d#Labor: Limited progression, will likely need several cytotec. Now s/p 2, last given at 1500. Plan to recheck in the next two hours.  #Pain: IV fent PRN  #FWB: Cat 1  #GBS Unknown and pending   #Chronic Hypertension: No severe ranges. Asymptomatic. Pre-e labs WNL. Cont home procardia '30mg'$  daily.    #T2DM: CBGs in appropriate range (<120). Will cont to monitor CBGs q4 while in latent labor, add insulin if needed.    #History of HSV: no current outbreak on pelvic exam. Cont Valtrex.     SaPatriciaann ClanDO 5:42 PM

## 2021-03-28 NOTE — Progress Notes (Signed)
CBG 133 

## 2021-03-28 NOTE — Progress Notes (Signed)
Labor Progress Note Triona Severin is a 25 y.o. G1P0000 at 62w3dpresented for IOL d/t poorly controlled T2DM and CHTN.   O:  BP (!) 158/102   Pulse 80   Temp 98.2 F (36.8 C) (Oral)   Resp 16   LMP 07/09/2020   EFM: Baseline 140 bpm, moderate variability, + accels, no decels  CVE: Dilation: Fingertip Effacement (%): 50 Cervical Position: Posterior Station: -2 Presentation: Vertex Exam by:: CReino Bellis RN  A&P: 25y.o. G1P0000 at 368w3dere for IOL d/t poorly controlled T2DM and CHTN.  #Labor: SVE unchanged from last check. Discussed options for additional augmentation. Patient would like to avoid FB if possible. Will give another dose of Cytotec and reassess in 4 hours. #Pain: IV Fentanyl PRN  #FWB: Cat 1 tracing #GBS: PCR positive; PCN  #Chronic Hypertension: No severe range pressures. Remains asymptomatic. Pre-e labs WNL. Last BP elevated at 158/102. If BP continues to trend upwards, may increase Procardia to 60 mg XL daily and/or consider adding additional antihypertensive.   #T2DM: CBGs continue to be in appropriate range (<120). Will continue to monitor CBGs q4 while in latent labor and add insulin if needed.   #History of HSV: No outbreak on pelvic exam on admission. Continue Valtrex.   ChGenia DelMD OB Fellow  Faculty Practice 9:22 PM

## 2021-03-28 NOTE — H&P (Signed)
OBSTETRIC ADMISSION HISTORY AND PHYSICAL  Maricel Siron is a 25 y.o. female G1P0000 with IUP at 50w3dby 5 week U/S presenting for [facility]OL [facility] to [facility] T[facility]DM. She reports +FMs, No LOF, no VB, no blurry vision, headaches or peripheral edema, and RUQ pain.  She plans on breast feeding. She is undecided for contraception, declines currently.  [facility]  Dating: By 5 week U/S c/s LMP --->  Estimated Date of Delivery: 04/15/21  Sono:    '@[redacted]w[redacted]d'$ , CWD, normal anatomy, cephalic presentation, 3123456(6lbs 14oz), 80% EFW   Prenatal History/Complications:  --Chronic hypertension: intermittently on procardia  --T2DM: poor compliance with insulin, last took last week.  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   Past Medical History: Past Medical History:  Diagnosis Date   Anxiety    Asthma    COVID-19 08/16/2020   DM (diabetes mellitus), type 2 ([place]    Fibromyalgia    Genital herpes    History of marijuana use 2021   Hypercholesteremia    Hypertension    Nexplanon insertion 08/26/2013   Lot #[id number] Exp 02/2016    PCOS (polycystic ovarian syndrome)     Past Surgical History: Past Surgical History:  Procedure Laterality Date   abscess removal      TONSILLECTOMY     TONSILLECTOMY AND ADENOIDECTOMY     tonsils only    Obstetrical History: OB History     Gravida  1   Para  0   Term  0   Preterm  0   AB  0   Living  0      SAB  0   IAB  0   Ectopic  0   Multiple  0   Live Births              Social History Social History   Socioeconomic History   Marital status: Significant Other    Spouse name: Not on file   Number of children: Not on file   Years of education: Not on file   Highest education level: Not on file  Occupational History   Not on file  Tobacco Use   Smoking status: Former    Types: Cigarettes    Quit date: 05/28/2014    Years since quitting: 6.8   Smokeless tobacco: Never  Vaping Use   Vaping Use: Never used  Substance and  Sexual Activity   Alcohol use: No   Drug use: Not Currently    Types: Marijuana    Comment: stopped once pregnant   Sexual activity: Yes    Birth control/protection: None    Comment: had nuva ring, but it is out now  Other Topics Concern   Not on file  Social History Narrative   Is in 12th grade at [employer] mom   Social Determinants of Health   Financial Resource Strain: Not on file  Food Insecurity: No Food Insecurity   Worried About RCharity fundraiserin the Last Year: Never true   Ran Out of Food in the Last Year: Never true  Transportation Needs: No Transportation Needs   Lack of Transportation (Medical): No   Lack of Transportation (Non-Medical): No  Physical Activity: Not on file  Stress: Not on file  Social Connections: Not on file    Family History: Family History  Problem Relation Age of Onset   Hypertension Mother    Diabetes Mother    Hypertension Maternal Grandmother  Hypertension Maternal Grandfather    Hypertension Paternal Grandmother    Diabetes Paternal Grandmother     Allergies: No Known Allergies  Medications Prior to Admission  Medication Sig Dispense Refill Last Dose   acetaminophen (TYLENOL) 500 MG tablet Take 500 mg by mouth every 6 (six) hours as needed.      albuterol (VENTOLIN HFA) 108 (90 Base) MCG/ACT inhaler Inhale 2 puffs into the lungs every 6 (six) hours as needed for wheezing or shortness of breath. 8.5 g 1    amoxicillin (AMOXIL) 500 MG capsule Take 1 capsule (500 mg total) by mouth 3 (three) times daily after meals. 21 capsule 0    Continuous Blood Gluc Receiver (DEXCOM G6 RECEIVER) DEVI use as directed. 1 each 0    Continuous Blood Gluc Sensor (DEXCOM G6 SENSOR) MISC 1 Device by Does not apply route as directed. 3 each 33    Continuous Blood Gluc Transmit (DEXCOM G6 TRANSMITTER) MISC 1 Device by Does not apply route as directed. 1 each 3    cyclobenzaprine (FLEXERIL) 10 MG tablet Take 1 tablet (10 mg total) by  mouth 2 (two) times daily as needed for muscle spasms. 20 tablet 0    insulin NPH Human (NOVOLIN N) 100 UNIT/ML injection INJECT 0.13 MLS (13 UNITS TOTAL) INTO THE SKIN DAILY WITH BREAKFAST. 10 mL 3    Insulin Pen Needle (PEN NEEDLES) 31G X 8 MM MISC Inject 1 application into the skin at bedtime. 100 each 3    NIFEdipine (PROCARDIA-XL/NIFEDICAL-XL) 30 MG 24 hr tablet Take 1 tablet (30 mg total) by mouth daily. 30 tablet 5    omeprazole (PRILOSEC) 20 MG capsule TAKE 1 CAPSULE (20 MG TOTAL) BY MOUTH IN THE MORNING AND AT BEDTIME. 30 capsule 3    ondansetron (ZOFRAN) 4 MG tablet Take 1 tablet (4 mg total) by mouth every 8 (eight) hours as needed for nausea or vomiting. (Patient not taking: Reported on 03/24/2021) 20 tablet 0    polyethylene glycol (MIRALAX / GLYCOLAX) 17 g packet Take 17 g by mouth daily.      Prenatal Vit-Fe Fumarate-FA (PRENATAL MULTIVITAMIN) TABS tablet Take 1 tablet by mouth daily at 12 noon.      promethazine (PHENERGAN) 25 MG suppository Place 1 suppository (25 mg total) rectally every 6 (six) hours as needed for nausea or vomiting. (Patient not taking: Reported on 03/24/2021) 12 each 0    sertraline (ZOLOFT) 50 MG tablet TAKE 1 TABLET (50 MG TOTAL) BY MOUTH DAILY. 30 tablet 5    valACYclovir (VALTREX) 500 MG tablet Take 1 tablet (500 mg total) by mouth daily. 30 tablet 12      Review of Systems   All systems reviewed and negative except as stated in HPI  Last menstrual period 07/09/2020. General appearance: alert, cooperative, and no distress Lungs: clear to auscultation bilaterally Heart: regular rate and rhythm Abdomen: soft, non-tender; bowel sounds normal Pelvic: no HSV lesions noted on external genitalia  Extremities: Homans sign is negative, no sign of DVT Presentation: cephalic Fetal monitoringBaseline: 140 bpm, Variability: Good {> 6 bpm), Accelerations: Reactive, and Decelerations: Absent Uterine activityNone Dilation: Fingertip Effacement (%): 50 Exam by::  Clementeen Hoof RN   Prenatal labs: ABO, Rh: --/--/PENDING (08/15 [id number]) Antibody: PENDING (08/15 [id number]) Rubella: 5.15 (02/09 1455) RPR: Non Reactive (06/07 1612)  HBsAg: Negative (02/09 1455)  HIV: Non Reactive (06/07 1612)  GBS:    Genetic screening  low risk NIPS Anatomy US normal   Prenatal Transfer Tool  Maternal Diabetes:  Yes:  Diabetes Type:  Pre-pregnancy, Insulin/Medication controlled Genetic Screening: Normal Maternal Ultrasounds/Referrals: Normal Fetal Ultrasounds or other Referrals:  Fetal echo Maternal Substance Abuse:  Yes:  Type: Marijuana Significant Maternal Medications:  Meds include: Zoloft Other: procardia, insulin  Significant Maternal Lab Results: None  Results for orders placed or performed during the hospital encounter of 03/28/21 (from the past 24 hour(s))  Type and screen   Collection Time: 03/28/21  8:28 AM  Result Value Ref Range   ABO/RH(D) PENDING    Antibody Screen PENDING    Sample Expiration      03/31/2021,2359 Performed at [facility], [address], [place], [place] [address]     Patient Active Problem List   Diagnosis Date Noted   Marijuana use 12/28/2020   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   Anemia during pregnancy in second trimester 11/18/2020   History of COVID-19 11/18/2020   H/O fibromyalgia 10/21/2020   Alpha thalassemia silent carrier 10/20/2020   Hyperthyroidism 10/18/2020   Supervision of high risk pregnancy, antepartum 09/22/2020   Pre-existing type 2 diabetes mellitus in pregnancy 01/07/2014   Hyperlipidemia 10/19/2011   Chronic hypertension during pregnancy, antepartum 10/19/2011    Assessment/Plan:  Shalissa Waheed is a 25 y.o. G1P0000 at 1w3dhere for IOL due to cHTN and T2DM.   #Labor: cytotec placed earlier this morning, will likely need several. Patient would like to avoid FB is possible.  #Pain: IV fent PRN  #FWB: Cat 1 #ID: GBS collected on 8/11 but not resulted, collected GBS PCR + culture. At term without  risk factors, no prophylaxis currently.  #MOF: breastfeeding #MOC:undecided #Circ:  Yes, inpatient   #Chronic Hypertension: BP WNL since arrival. Poor compliance with procardia '30mg'$  at home. Asymptomatic. Will obtain CMP. Cont home procardia, last taken yesterday.   #T2DM: Most recent CBG 109. Will cont to monitor CBGs q4 while in latent labor, add insulin if needed. EFW 80% (3114g) at 36 weeks. Used trulicity only prior to pregnancy, may need alternative while breastfeeding.   #History of HSV: no current outbreak on pelvic exam. Cont Valtrex.    SPatriciaann Clan DO  03/28/2021, 8:51 AM

## 2021-03-29 ENCOUNTER — Encounter: Payer: Self-pay | Admitting: Medical

## 2021-03-29 ENCOUNTER — Inpatient Hospital Stay (HOSPITAL_COMMUNITY): Payer: Medicaid Other | Admitting: Anesthesiology

## 2021-03-29 ENCOUNTER — Other Ambulatory Visit: Payer: Self-pay

## 2021-03-29 ENCOUNTER — Encounter (HOSPITAL_COMMUNITY): Payer: Self-pay | Admitting: Obstetrics & Gynecology

## 2021-03-29 DIAGNOSIS — B951 Streptococcus, group B, as the cause of diseases classified elsewhere: Secondary | ICD-10-CM | POA: Insufficient documentation

## 2021-03-29 DIAGNOSIS — O1092 Unspecified pre-existing hypertension complicating childbirth: Secondary | ICD-10-CM | POA: Diagnosis not present

## 2021-03-29 DIAGNOSIS — O2492 Unspecified diabetes mellitus in childbirth: Secondary | ICD-10-CM | POA: Diagnosis not present

## 2021-03-29 DIAGNOSIS — O119 Pre-existing hypertension with pre-eclampsia, unspecified trimester: Secondary | ICD-10-CM | POA: Diagnosis not present

## 2021-03-29 DIAGNOSIS — O1414 Severe pre-eclampsia complicating childbirth: Secondary | ICD-10-CM | POA: Diagnosis not present

## 2021-03-29 DIAGNOSIS — O24424 Gestational diabetes mellitus in childbirth, insulin controlled: Secondary | ICD-10-CM | POA: Diagnosis not present

## 2021-03-29 DIAGNOSIS — O99820 Streptococcus B carrier state complicating pregnancy: Secondary | ICD-10-CM | POA: Diagnosis not present

## 2021-03-29 DIAGNOSIS — Z3A37 37 weeks gestation of pregnancy: Secondary | ICD-10-CM | POA: Diagnosis not present

## 2021-03-29 LAB — CBC
HCT: 35 % — ABNORMAL LOW (ref 36.0–46.0)
HCT: 35.2 % — ABNORMAL LOW (ref 36.0–46.0)
Hemoglobin: 11.6 g/dL — ABNORMAL LOW (ref 12.0–15.0)
Hemoglobin: 11.2 g/dL — ABNORMAL LOW (ref 12.0–15.0)
MCH: 26.9 pg (ref 26.0–34.0)
MCH: 26.1 pg (ref 26.0–34.0)
MCHC: 33.1 g/dL (ref 30.0–36.0)
MCHC: 31.8 g/dL (ref 30.0–36.0)
MCV: 81.2 fL (ref 80.0–100.0)
MCV: 82.1 fL (ref 80.0–100.0)
Platelets: 253 10*3/uL (ref 150–400)
Platelets: 243 10*3/uL (ref 150–400)
RBC: 4.31 MIL/uL (ref 3.87–5.11)
RBC: 4.29 MIL/uL (ref 3.87–5.11)
RDW: 13.5 % (ref 11.5–15.5)
RDW: 13.6 % (ref 11.5–15.5)
WBC: 12.8 10*3/uL — ABNORMAL HIGH (ref 4.0–10.5)
WBC: 15.6 10*3/uL — ABNORMAL HIGH (ref 4.0–10.5)
nRBC: 0 % (ref 0.0–0.2)
nRBC: 0 % (ref 0.0–0.2)

## 2021-03-29 LAB — GLUCOSE, CAPILLARY
Glucose-Capillary: 106 mg/dL — ABNORMAL HIGH (ref 70–99)
Glucose-Capillary: 108 mg/dL — ABNORMAL HIGH (ref 70–99)
Glucose-Capillary: 122 mg/dL — ABNORMAL HIGH (ref 70–99)
Glucose-Capillary: 127 mg/dL — ABNORMAL HIGH (ref 70–99)
Glucose-Capillary: 138 mg/dL — ABNORMAL HIGH (ref 70–99)
Glucose-Capillary: 143 mg/dL — ABNORMAL HIGH (ref 70–99)

## 2021-03-29 LAB — COMPREHENSIVE METABOLIC PANEL
ALT: 10 U/L (ref 0–44)
AST: 16 U/L (ref 15–41)
Albumin: 2.6 g/dL — ABNORMAL LOW (ref 3.5–5.0)
Alkaline Phosphatase: 121 U/L (ref 38–126)
Anion gap: 11 (ref 5–15)
BUN: 6 mg/dL (ref 6–20)
CO2: 21 mmol/L — ABNORMAL LOW (ref 22–32)
Calcium: 8.8 mg/dL — ABNORMAL LOW (ref 8.9–10.3)
Chloride: 105 mmol/L (ref 98–111)
Creatinine, Ser: 0.77 mg/dL (ref 0.44–1.00)
GFR, Estimated: >60 mL/min (ref >60)
Glucose, Bld: 101 mg/dL — ABNORMAL HIGH (ref 70–99)
Potassium: 3.8 mmol/L (ref 3.5–5.1)
Sodium: 137 mmol/L (ref 135–145)
Total Bilirubin: 0.7 mg/dL (ref 0.3–1.2)
Total Protein: 6.3 g/dL — ABNORMAL LOW (ref 6.5–8.1)

## 2021-03-29 LAB — PROTEIN / CREATININE RATIO, URINE
Creatinine, Urine: 104.79 mg/dL
Protein Creatinine Ratio: 0.26 mg/mg{Cre} — ABNORMAL HIGH (ref 0.00–0.15)
Total Protein, Urine: 27 mg/dL

## 2021-03-29 LAB — CULTURE, BETA STREP (GROUP B ONLY): Strep Gp B Culture: POSITIVE — AB

## 2021-03-29 MED ORDER — OXYTOCIN-SODIUM CHLORIDE 30-0.9 UT/500ML-% IV SOLN
1.0000 m[IU]/min | INTRAVENOUS | Status: DC
  Administered 2021-03-29: 2 m[IU]/min via INTRAVENOUS

## 2021-03-29 MED ORDER — DIPHENHYDRAMINE HCL 50 MG/ML IJ SOLN
12.5000 mg | INTRAMUSCULAR | Status: DC | PRN
Start: 2021-03-29 — End: 2021-03-30

## 2021-03-29 MED ORDER — HYDRALAZINE HCL 20 MG/ML IJ SOLN: 10.0000 mg | INTRAMUSCULAR | Status: DC | PRN

## 2021-03-29 MED ORDER — MAGNESIUM SULFATE 40 GM/1000ML IV SOLN: 2.0000 g/h | INTRAVENOUS | Status: DC

## 2021-03-29 MED ORDER — EPHEDRINE 5 MG/ML INJ: 10.0000 mg | INTRAVENOUS | Status: DC | PRN

## 2021-03-29 MED ORDER — LABETALOL HCL 5 MG/ML IV SOLN: 80.0000 mg | INTRAVENOUS | Status: DC | PRN

## 2021-03-29 MED ORDER — LABETALOL HCL 5 MG/ML IV SOLN
20.0000 mg | INTRAVENOUS | Status: DC | PRN
  Administered 2021-03-29 – 2021-03-30 (×2): 20 mg via INTRAVENOUS
  Filled 2021-03-29 (×5): qty 4

## 2021-03-29 MED ORDER — FENTANYL-BUPIVACAINE-NACL 0.5-0.125-0.9 MG/250ML-% EP SOLN
12.0000 mL/h | EPIDURAL | Status: DC | PRN
  Administered 2021-03-29: 12 mL/h via EPIDURAL
  Filled 2021-03-29 (×2): qty 250

## 2021-03-29 MED ORDER — LABETALOL HCL 5 MG/ML IV SOLN
40.0000 mg | INTRAVENOUS | Status: DC | PRN
  Administered 2021-03-29 – 2021-03-30 (×2): 40 mg via INTRAVENOUS
  Filled 2021-03-29 (×2): qty 8

## 2021-03-29 MED ORDER — SODIUM CHLORIDE 0.9 % IV SOLN
12.5000 mg | Freq: Once | INTRAVENOUS | Status: AC
  Administered 2021-03-29: 12.5 mg via INTRAVENOUS
  Filled 2021-03-29: qty 0.5

## 2021-03-29 MED ORDER — LACTATED RINGERS IV SOLN: 500.0000 mL | Freq: Once | INTRAVENOUS | Status: DC

## 2021-03-29 MED ORDER — LABETALOL HCL 5 MG/ML IV SOLN
20.0000 mg | INTRAVENOUS | Status: DC | PRN
  Administered 2021-03-29: 20 mg via INTRAVENOUS

## 2021-03-29 MED ORDER — LABETALOL HCL 5 MG/ML IV SOLN: 40.0000 mg | INTRAVENOUS | Status: DC | PRN

## 2021-03-29 MED ORDER — LABETALOL HCL 5 MG/ML IV SOLN
INTRAVENOUS | Status: AC
  Administered 2021-03-30: 20 mg via INTRAVENOUS
  Filled 2021-03-29: qty 4

## 2021-03-29 MED ORDER — FENTANYL CITRATE (PF) 100 MCG/2ML IJ SOLN
INTRAMUSCULAR | Status: AC
  Filled 2021-03-29: qty 2

## 2021-03-29 MED ORDER — LIDOCAINE-EPINEPHRINE (PF) 2 %-1:200000 IJ SOLN
INTRAMUSCULAR | Status: DC | PRN
  Administered 2021-03-29: 5 mL via EPIDURAL

## 2021-03-29 MED ORDER — PHENYLEPHRINE 40 MCG/ML (10ML) SYRINGE FOR IV PUSH (FOR BLOOD PRESSURE SUPPORT): 80.0000 ug | PREFILLED_SYRINGE | INTRAVENOUS | Status: DC | PRN

## 2021-03-29 MED ORDER — FENTANYL CITRATE (PF) 100 MCG/2ML IJ SOLN
100.0000 ug | INTRAMUSCULAR | Status: DC | PRN
  Administered 2021-03-29 (×3): 100 ug via INTRAVENOUS
  Filled 2021-03-29 (×2): qty 2

## 2021-03-29 MED ORDER — TERBUTALINE SULFATE 1 MG/ML IJ SOLN: 0.2500 mg | Freq: Once | INTRAMUSCULAR | Status: DC | PRN

## 2021-03-29 MED ORDER — MAGNESIUM SULFATE 40 GM/1000ML IV SOLN
INTRAVENOUS | Status: AC
  Filled 2021-03-29: qty 1000

## 2021-03-29 MED ORDER — MAGNESIUM SULFATE BOLUS VIA INFUSION
4.0000 g | Freq: Once | INTRAVENOUS | Status: AC
  Administered 2021-03-29: 4 g via INTRAVENOUS
  Filled 2021-03-29: qty 1000

## 2021-03-29 NOTE — Discharge Summary (Signed)
Postpartum Discharge Summary    Patient Name: Tamara Reyes DOB: 03/27/1996 MRN: [id number]  Date of admission: 03/28/2021 Delivery date:03/29/2021  Delivering provider: Genia Del  Date of discharge: 04/01/2021  Admitting diagnosis: Chronic hypertension during pregnancy, antepartum [O10.919] Intrauterine pregnancy: [redacted]w[redacted]d     Secondary diagnosis:  Principal Problem:   Vaginal delivery Active Problems:   Pre-existing type 2 diabetes mellitus in pregnancy   HSV-2 infection   Chronic hypertension with superimposed pre-eclampsia  Additional problems: None    Discharge diagnosis: Term Pregnancy Delivered                                              Post partum procedures: n/a Augmentation: AROM, Pitocin, Cytotec, and IP Foley Complications: None  Hospital course: Induction of Labor With Vaginal Delivery   25 y.o. yo G1P0000 at [redacted]w[redacted]d was admitted to the hospital 03/28/2021 for induction of labor.  Indication for induction: TYPE 2 DM and cHTN .  Patient began her induction with Cytotec followed by FB placement, AROM, and Pitocin until she progressed to complete. She developed superimposed pre-eclampsia intrapartum based on blood pressures and a headache and was started on Magnesium.  Membrane Rupture Time/Date: 11:20 AM ,03/29/2021   Delivery Method:Vaginal, Spontaneous  Episiotomy: None  Lacerations:  Right periurethral  Details of delivery can be found in separate delivery note.  Patient had a postpartum course notable for Magnesium x 24 hours. She was kept for 3 days postpartum to adjust her BP and was begun on Procardia, which was titrated up. She was given lasix in addition to her procardia for BP management at home. Meds to beds and Babyscripts were given.  Patient is discharged home 04/01/21.  Newborn Data: Birth date:03/29/2021  Birth time:10:18 PM  Gender:Female  Living status:Living  Apgars:9 ,9  Weight:2821 g   Magnesium Sulfate received: Yes: Seizure prophylaxis  (Pre-Eclampsia) BMZ received: No Rhophylac:N/A MMR:N/A - Immune T-DaP:Given prenatally Flu: No Transfusion:No  Physical exam  Vitals:   03/31/21 2245 04/01/21 0237 04/01/21 0600 04/01/21 0809  BP: 120/83 131/80 (!) 147/92 (!) 144/90  Pulse: (!) 110 80 (!) 108 86  Resp: $Remo'18 18 18 18  'BHqtd$ Temp: 98.3 F (36.8 C) 98 F (36.7 C) 98.4 F (36.9 C)   TempSrc: Oral  Oral   SpO2: 100%  98% 100%   General: alert, cooperative, and no distress Lochia: appropriate Uterine Fundus: firm DVT Evaluation: No evidence of DVT seen on physical exam. Labs: Lab Results  Component Value Date   WBC 15.6 (H) 03/29/2021   HGB 11.2 (L) 03/29/2021   HCT 35.2 (L) 03/29/2021   MCV 82.1 03/29/2021   PLT 243 03/29/2021   CMP Latest Ref Rng & Units 03/29/2021  Glucose 70 - 99 mg/dL 101(H)  BUN 6 - 20 mg/dL 6  Creatinine 0.44 - 1.00 mg/dL 0.77  Sodium 135 - 145 mmol/L 137  Potassium 3.5 - 5.1 mmol/L 3.8  Chloride 98 - 111 mmol/L 105  CO2 22 - 32 mmol/L 21(L)  Calcium 8.9 - 10.3 mg/dL 8.8(L)  Total Protein 6.5 - 8.1 g/dL 6.3(L)  Total Bilirubin 0.3 - 1.2 mg/dL 0.7  Alkaline Phos 38 - 126 U/L 121  AST 15 - 41 U/L 16  ALT 0 - 44 U/L 10   Edinburgh Score: Edinburgh Postnatal Depression Scale Screening Tool 03/31/2021  I have been able to laugh  and see the funny side of things. 0  I have looked forward with enjoyment to things. 0  I have blamed myself unnecessarily when things went wrong. 2  I have been anxious or worried for no good reason. 2  I have felt scared or panicky for no good reason. 1  Things have been getting on top of me. 1  I have been so unhappy that I have had difficulty sleeping. 0  I have felt sad or miserable. 1  I have been so unhappy that I have been crying. 1  The thought of harming myself has occurred to me. 0  Edinburgh Postnatal Depression Scale Total 8     After visit meds:  Allergies as of 04/01/2021   No Known Allergies      Medication List     STOP taking these  medications    acetaminophen 500 MG tablet Commonly known as: TYLENOL   amoxicillin 500 MG capsule Commonly known as: AMOXIL   cyclobenzaprine 10 MG tablet Commonly known as: FLEXERIL   Dexcom G6 Receiver Devi   Dexcom G6 Sensor Misc   Dexcom G6 Transmitter Misc   HumuLIN N 100 UNIT/ML injection Generic drug: insulin NPH Human   ondansetron 4 MG tablet Commonly known as: ZOFRAN   Pen Needles 31G X 8 MM Misc   polyethylene glycol 17 g packet Commonly known as: MIRALAX / GLYCOLAX   promethazine 25 MG suppository Commonly known as: PHENERGAN       TAKE these medications    furosemide 20 MG tablet Commonly known as: Lasix Take 1 tablet (20 mg total) by mouth 2 (two) times daily.   ibuprofen 600 MG tablet Commonly known as: ADVIL Take 1 tablet (600 mg total) by mouth every 6 (six) hours as needed.   NIFEdipine 90 MG 24 hr tablet Commonly known as: PROCARDIA XL/NIFEDICAL-XL Take 1 tablet (90 mg total) by mouth daily. What changed:  medication strength how much to take   omeprazole 20 MG capsule Commonly known as: PRILOSEC TAKE 1 CAPSULE (20 MG TOTAL) BY MOUTH IN THE MORNING AND AT BEDTIME. What changed:  how much to take how to take this when to take this   prenatal multivitamin Tabs tablet Take 1 tablet by mouth daily at 12 noon.   ProAir HFA 108 (90 Base) MCG/ACT inhaler Generic drug: albuterol Inhale 2 puffs into the lungs every 6 (six) hours as needed for wheezing or shortness of breath.   sertraline 50 MG tablet Commonly known as: ZOLOFT TAKE 1 TABLET (50 MG TOTAL) BY MOUTH DAILY. What changed: how much to take   valACYclovir 500 MG tablet Commonly known as: Valtrex Take 1 tablet (500 mg total) by mouth daily.         Discharge home in stable condition Infant Feeding: Breast Infant Disposition:home with mother Discharge instruction: per After Visit Summary and Postpartum booklet. Activity: Advance as tolerated. Pelvic rest for 6  weeks.  Diet: carb modified diet and low salt diet Future Appointments: Future Appointments  Date Time Provider [place]  04/05/2021  8:50 AM [facility]  04/05/2021 10:00 AM [facility] NURSE [facility]  05/02/2021  1:35 PM Jorje Guild, NP [facility]   Follow up Visit:  [place] [facility] [place] [facility] Follow up in 1 week(s).   Specialty: Obstetrics and Gynecology Why: blood pressure check Contact information: [place] [address] [phone]  Message sent by Dr. Gwenlyn Perking on 8/16.  Please schedule this patient for a In person postpartum visit in 4 weeks with the following provider: Any provider. Additional Postpartum F/U:2 hour GTT and BP check 1 week  High risk pregnancy complicated by: HTN and D5KZ with superimposed pre-eclampsia Delivery mode:  Vaginal, Spontaneous  Anticipated Birth Control:  Unsure   04/01/2021 Donnamae Jude, MD

## 2021-03-29 NOTE — Progress Notes (Signed)
Labor Progress Note Tamara Reyes is a 25 y.o. G1P0000 at 45w4dpresented for IOL due to poorly controlled cHTN (now SIPE) and T2DM.   S: Doing well currently. Noted severe range pressures on several checks this morning (SBP >170s), given IV labetalol and started on Mg IV. She had also had a headache overnight that did not respond to pain medication, however has resolved this morning.   O:  BP (!) 150/88   Pulse 88   Temp 98.1 F (36.7 C) (Oral)   Resp 16   LMP 07/09/2020   SpO2 99%  EFM: 150/min-mod/none/no decels   CVE: Dilation: 2 Effacement (%): 50 Cervical Position: Posterior Station: -2 Presentation: Vertex Exam by:: Tamara Reyes   A&P: 25y.o. G1P0000 370w4d#Labor: Some progression since last check, noted bulging bag on check. FB placed, however unfortunately also AROM with clear fluid. Will monitor.  #Pain: IV Fent PRN  #FWB: Cat II on occasion with minimal variability--just start on Mg IV. Monitoring closely.   #GBS positive (PCN)  #cHTN, now severe SIPE: Severe range pressures earlier this morning (SBP>170's) associated with HA (now resolved), given IV labetalol x1 and started on Mg IV. Repeat Pre-e labs WNL. Cont procardia '30mg'$  daily.   #T2DM: Most recent CBG 108, will cont to monitor. No current insulin.   SaPatriciaann ClanDO 11:28 AM

## 2021-03-29 NOTE — Anesthesia Procedure Notes (Signed)
Epidural Patient location during procedure: OB Start time: 03/29/2021 6:15 PM End time: 03/29/2021 6:25 PM  Staffing Anesthesiologist: Freddrick March, MD Performed: anesthesiologist   Preanesthetic Checklist Completed: patient identified, IV checked, risks and benefits discussed, monitors and equipment checked, pre-op evaluation and timeout performed  Epidural Patient position: sitting Prep: DuraPrep and site prepped and draped Patient monitoring: continuous pulse ox, blood pressure, heart rate and cardiac monitor Approach: midline Location: L3-L4 Injection technique: LOR air  Needle:  Needle type: Tuohy  Needle gauge: 17 G Needle length: 9 cm Needle insertion depth: 7 cm Catheter type: closed end flexible Catheter size: 19 Gauge Catheter at skin depth: 12 cm Test dose: negative  Assessment Sensory level: T8 Events: blood not aspirated, injection not painful, no injection resistance, no paresthesia and negative IV test  Additional Notes Patient identified. Risks/Benefits/Options discussed with patient including but not limited to bleeding, infection, nerve damage, paralysis, failed block, incomplete pain control, headache, blood pressure changes, nausea, vomiting, reactions to medication both or allergic, itching and postpartum back pain. Confirmed with bedside nurse the patient's most recent platelet count. Confirmed with patient that they are not currently taking any anticoagulation, have any bleeding history or any family history of bleeding disorders. Patient expressed understanding and wished to proceed. All questions were answered. Sterile technique was used throughout the entire procedure. Please see nursing notes for vital signs. Test dose was given through epidural catheter and negative prior to continuing to dose epidural or start infusion. Warning signs of high block given to the patient including shortness of breath, tingling/numbness in hands, complete motor block,  or any concerning symptoms with instructions to call for help. Patient was given instructions on fall risk and not to get out of bed. All questions and concerns addressed with instructions to call with any issues or inadequate analgesia.  Reason for block:procedure for pain

## 2021-03-29 NOTE — Progress Notes (Signed)
Labor Progress Note Tamara Reyes is a 25 y.o. G1P0000 at 102w4dpresented for IOL due to poorly controlled cHTN now SIPE severe and T2DM.   S: Resting comfortably. Denies headache or vision changes at this time. No concerns.  O:  BP (!) 146/84   Pulse (!) 104   Temp 98 F (36.7 C) (Oral)   Resp 16   LMP 07/09/2020   SpO2 99%  EFM: 130/mod/no accels/variable decels  CVE: Dilation: 7.5 Effacement (%): 80 Cervical Position: Posterior Station: 0 Presentation: Vertex Exam by:: MLinton Rump RN   A&P: 25y.o. G1P0000 362w4d #Labor: Progressing well since last check. SVE now 7.5/80/0. Contractions are adequate with IUPC in place. Continue Pitocin at current dose. Will reassess in 2 hours. #Pain: Epidural #FWB: Cat II - reassuring variability #GBS positive; PCN   #cHTN, now severe SIPE: On Mag and Procardia 30 mg XL daily. Asymptomatic. Pre-E labs this AM normal. Mild range pressures. Continue to monitor.    #T2DM: Most recent CBG 122. Fairly good control overall. Will reassess at next check in 2 hours now that patient is in active labor. Will add insulin as necessary.  ChGenia DelMD OB Fellow  Faculty Practice 9:39 PM

## 2021-03-29 NOTE — Progress Notes (Signed)
Labor Progress Note Tamara Reyes is a 25 y.o. G1P0000 at 69w4dpresented for IOL due to poorly controlled cHTN now SIPE severe and T2DM.   S: Feeling contractions more severe. Still a little nauseous   O:  BP (!) 136/92   Pulse 88   Temp 98.1 F (36.7 C) (Oral)   Resp 16   LMP 07/09/2020   SpO2 99%  EFM: 135/mod/10x10/early and occasional variable   CVE: Dilation: 4 Effacement (%): 80 Cervical Position: Posterior Station: -2 Presentation: Vertex Exam by:: lee   A&P: 25y.o. G1P0000 362w4d#Labor: Minimal progression since last check. Has intermittently had some shallow variables with appropriate variability. Difficulty tracing contractions, placed IUPC after receiving verbal consent and tolerated well.  #Pain: IV fent PRN  #FWB: Cat II currently but reassuring, working with position changes and IUPC placed (may start amnioinfusion pending)  #GBS positive PCN   #cHTN, now severe SIPE: She has received labetalol on 3 separate occasions today. Currently asymptomatic (resolved HA from last night). Pre-e labs this am WNL, if not delivered may repeat later tonight vs in am. Cont procardia '30mg'$  daily and Mg IV.    #T2DM: Most recent CBG 106, will cont to monitor. No current insulin.  SaPatriciaann ClanDO 5:41 PM

## 2021-03-29 NOTE — Progress Notes (Signed)
Labor Progress Note Tamara Reyes is a 25 y.o. G1P0000 at 68w4dwho presented for IOL d/t poorly controlled T2DM and CHTN.   O:  BP (!) 139/91   Pulse 71   Temp 98.1 F (36.7 C) (Oral)   Resp 16   LMP 07/09/2020   EFM: Baseline 150 bpm, moderate variability, + accels, no decels  CVE: Dilation: 1.5 Effacement (%): 50 Cervical Position: Posterior Station: -2 Presentation: Vertex Exam by:: Tamara Tassin, MD  A&P: 25y.o. G1P0000 at 336w4dere for IOL d/t poorly controlled T2DM and CHTN.  #Labor: SVE now 1.5/50/-2. Attempted foley balloon placement however cervix was very posterior and unable to place this check. Offered speculum placement. Patient would like to continue with Cytotec for now. Cytotec 25 mcg placed vaginally. Will reassess in 4 hours. #Pain: IV Fentanyl PRN  #FWB: Cat 1 tracing #GBS: PCR positive; PCN  #Chronic Hypertension: Mild range pressures. Remains asymptomatic. Pre-e labs WNL. Procardia 30 mg XL daily ordered. Will continue to monitor.   #T2DM: Last CBG 143 after meal. Will reassess in 4 hours. Will consider insulin as needed.   #History of HSV: No outbreak on pelvic exam on admission. Continue Valtrex.   ChGenia DelMD OB Fellow  Faculty Practice 1:53 AM

## 2021-03-29 NOTE — Anesthesia Preprocedure Evaluation (Signed)
Anesthesia Evaluation  Patient identified by MRN, date of birth, ID band Patient awake    Reviewed: Allergy & Precautions, NPO status , Patient's Chart, lab work & pertinent test results  Airway Mallampati: II  TM Distance: >3 FB Neck ROM: Full    Dental no notable dental hx.    Pulmonary asthma , former smoker,    Pulmonary exam normal breath sounds clear to auscultation       Cardiovascular hypertension, Pt. on medications Normal cardiovascular exam Rhythm:Regular Rate:Normal     Neuro/Psych PSYCHIATRIC DISORDERS Anxiety    GI/Hepatic negative GI ROS, Neg liver ROS,   Endo/Other  diabetes, Type 2, Insulin Dependent  Renal/GU negative Renal ROS  negative genitourinary   Musculoskeletal  (+) Fibromyalgia -  Abdominal   Peds  Hematology negative hematology ROS (+)   Anesthesia Other Findings IOL for cHTN and DM2. cHTN now progressed to SIPE  Reproductive/Obstetrics (+) Pregnancy                             Anesthesia Physical Anesthesia Plan  ASA: 3  Anesthesia Plan: Epidural   Post-op Pain Management:    Induction:   PONV Risk Score and Plan: Treatment may vary due to age or medical condition  Airway Management Planned: Natural Airway  Additional Equipment:   Intra-op Plan:   Post-operative Plan:   Informed Consent: I have reviewed the patients History and Physical, chart, labs and discussed the procedure including the risks, benefits and alternatives for the proposed anesthesia with the patient or authorized representative who has indicated his/her understanding and acceptance.       Plan Discussed with: Anesthesiologist  Anesthesia Plan Comments: (Patient identified. Risks, benefits, options discussed with patient including but not limited to bleeding, infection, nerve damage, paralysis, failed block, incomplete pain control, headache, blood pressure changes, nausea,  vomiting, reactions to medication, itching, and post partum back pain. Confirmed with bedside nurse the patient's most recent platelet count. Confirmed with the patient that they are not taking any anticoagulation, have any bleeding history or any family history of bleeding disorders. Patient expressed understanding and wishes to proceed. All questions were answered. )        Anesthesia Quick Evaluation

## 2021-03-30 ENCOUNTER — Other Ambulatory Visit: Payer: Medicaid Other

## 2021-03-30 ENCOUNTER — Encounter: Payer: Medicaid Other | Admitting: Certified Nurse Midwife

## 2021-03-30 LAB — GLUCOSE, CAPILLARY
Glucose-Capillary: 164 mg/dL — ABNORMAL HIGH (ref 70–99)
Glucose-Capillary: 85 mg/dL (ref 70–99)
Glucose-Capillary: 120 mg/dL — ABNORMAL HIGH (ref 70–99)
Glucose-Capillary: 155 mg/dL — ABNORMAL HIGH (ref 70–99)

## 2021-03-30 MED ORDER — BENZOCAINE-MENTHOL 20-0.5 % EX AERO
1.0000 "application " | INHALATION_SPRAY | CUTANEOUS | Status: DC | PRN
  Administered 2021-03-30: 1 via TOPICAL
  Filled 2021-03-30: qty 56

## 2021-03-30 MED ORDER — TETANUS-DIPHTH-ACELL PERTUSSIS 5-2.5-18.5 LF-MCG/0.5 IM SUSY: 0.5000 mL | PREFILLED_SYRINGE | Freq: Once | INTRAMUSCULAR | Status: DC

## 2021-03-30 MED ORDER — MAGNESIUM SULFATE 40 GM/1000ML IV SOLN
2.0000 g/h | INTRAVENOUS | Status: AC
  Administered 2021-03-30: 2 g/h via INTRAVENOUS
  Filled 2021-03-30: qty 1000

## 2021-03-30 MED ORDER — COCONUT OIL OIL
1.0000 "application " | TOPICAL_OIL | Status: DC | PRN
  Administered 2021-03-30: 1 via TOPICAL

## 2021-03-30 MED ORDER — WITCH HAZEL-GLYCERIN EX PADS: 1.0000 "application " | MEDICATED_PAD | CUTANEOUS | Status: DC | PRN

## 2021-03-30 MED ORDER — PRENATAL MULTIVITAMIN CH
1.0000 | ORAL_TABLET | Freq: Every day | ORAL | Status: DC
  Filled 2021-03-30 (×3): qty 1

## 2021-03-30 MED ORDER — NIFEDIPINE ER OSMOTIC RELEASE 60 MG PO TB24
60.0000 mg | ORAL_TABLET | Freq: Two times a day (BID) | ORAL | Status: DC
  Administered 2021-03-30 – 2021-03-31 (×2): 60 mg via ORAL
  Filled 2021-03-30 (×2): qty 1

## 2021-03-30 MED ORDER — MEDROXYPROGESTERONE ACETATE 150 MG/ML IM SUSP: 150.0000 mg | INTRAMUSCULAR | Status: DC | PRN

## 2021-03-30 MED ORDER — NIFEDIPINE ER OSMOTIC RELEASE 30 MG PO TB24
30.0000 mg | ORAL_TABLET | Freq: Two times a day (BID) | ORAL | Status: DC
  Administered 2021-03-30: 30 mg via ORAL
  Filled 2021-03-30: qty 1

## 2021-03-30 MED ORDER — DIBUCAINE (PERIANAL) 1 % EX OINT: 1.0000 "application " | TOPICAL_OINTMENT | CUTANEOUS | Status: DC | PRN

## 2021-03-30 MED ORDER — ACETAMINOPHEN 325 MG PO TABS: 650.0000 mg | ORAL_TABLET | ORAL | Status: DC | PRN

## 2021-03-30 MED ORDER — MEASLES, MUMPS & RUBELLA VAC IJ SOLR: 0.5000 mL | Freq: Once | INTRAMUSCULAR | Status: DC

## 2021-03-30 MED ORDER — ONDANSETRON HCL 4 MG/2ML IJ SOLN: 4.0000 mg | INTRAMUSCULAR | Status: DC | PRN

## 2021-03-30 MED ORDER — IBUPROFEN 600 MG PO TABS
600.0000 mg | ORAL_TABLET | Freq: Four times a day (QID) | ORAL | Status: DC
  Administered 2021-03-30 – 2021-04-01 (×10): 600 mg via ORAL
  Filled 2021-03-30 (×10): qty 1

## 2021-03-30 MED ORDER — SIMETHICONE 80 MG PO CHEW: 80.0000 mg | CHEWABLE_TABLET | ORAL | Status: DC | PRN

## 2021-03-30 MED ORDER — SERTRALINE HCL 50 MG PO TABS
50.0000 mg | ORAL_TABLET | Freq: Every day | ORAL | Status: DC
  Administered 2021-03-30 – 2021-04-01 (×3): 50 mg via ORAL
  Filled 2021-03-30 (×3): qty 1

## 2021-03-30 MED ORDER — ONDANSETRON HCL 4 MG PO TABS: 4.0000 mg | ORAL_TABLET | ORAL | Status: DC | PRN

## 2021-03-30 MED ORDER — LACTATED RINGERS IV SOLN: INTRAVENOUS | Status: DC

## 2021-03-30 MED ORDER — DIPHENHYDRAMINE HCL 25 MG PO CAPS: 25.0000 mg | ORAL_CAPSULE | Freq: Four times a day (QID) | ORAL | Status: DC | PRN

## 2021-03-30 MED ORDER — SENNOSIDES-DOCUSATE SODIUM 8.6-50 MG PO TABS
2.0000 | ORAL_TABLET | Freq: Every day | ORAL | Status: DC
  Administered 2021-03-30 – 2021-04-01 (×3): 2 via ORAL
  Filled 2021-03-30 (×3): qty 2

## 2021-03-30 NOTE — Progress Notes (Signed)
Post Partum Day 1 Subjective: no complaints, up ad lib, voiding, and tolerating PO  Objective: Blood pressure 138/83, pulse 88, temperature 98.2 F (36.8 C), temperature source Oral, resp. rate 16, last menstrual period 07/09/2020, SpO2 100 %, unknown if currently breastfeeding.  Physical Exam:  General: alert, cooperative, and no distress Lochia: appropriate Uterine Fundus: firm DVT Evaluation: No evidence of DVT seen on physical exam.  Recent Labs    03/29/21 0853 03/29/21 1556  HGB 11.6* 11.2*  HCT 35.0* 35.2*    Assessment/Plan: Breastfeeding Magnesium sulfate for 24 hr pp  LOS: 2 days   Tamara Reyes 03/30/2021, 7:35 AM

## 2021-03-30 NOTE — Lactation Note (Signed)
This note was copied from a baby's chart. Lactation Consultation Note  Patient Name: Tamara Reyes [id number] Date: 03/30/2021 Reason for consult: 1st time breastfeeding;Early term 37-38.6wks;Follow-up assessment;Other (Comment) (2nd [place] visit this pm to check flange size. #21 Flange is a better fit using the hand pump to check. Mom  aware she still needs to pump with the DEBP to stimulate the tissue and shells after pumping while awake.) Age:82 hours  Maternal Data Has patient been taught Hand Expression?: Yes  Feeding Mother's Current Feeding Choice: Breast Milk and Formula Nipple Type: Nfant Slow Flow (purple)  LATCH Score  Lactation Tools Discussed/Used Tools: Pump;Shells;Flanges Flange Size: 21;24;Other (comment) (#21 flange the better fit for now) Breast pump type: Double-Electric Breast Pump;Manual Pump Education: Other (comment);Setup, frequency, and cleaning (DEBP Was already set up and LC showed mom how to use the hand pump for pre-pumping.)  Interventions Interventions: Breast feeding basics reviewed;Hand pump;Education;Shells  Discharge    Consult Status Consult Status: Follow-up Date: 03/31/21 Follow-up type: In-patient    [place] 03/30/2021, 4:20 PM

## 2021-03-30 NOTE — Plan of Care (Signed)
  Problem: Education: Goal: Individualized Educational Video(s) Outcome: Progressing   Problem: Fluid Volume: Goal: Peripheral tissue perfusion will improve Outcome: Progressing   Problem: Clinical Measurements: Goal: Ability to maintain clinical measurements within normal limits will improve Outcome: Progressing   Problem: Activity: Goal: Risk for activity intolerance will decrease Outcome: Progressing

## 2021-03-30 NOTE — Anesthesia Postprocedure Evaluation (Signed)
Anesthesia Post Note  Patient: Tamara Reyes  Procedure(s) Performed: AN AD HOC LABOR EPIDURAL     Patient location during evaluation: Mother Baby Anesthesia Type: Epidural Level of consciousness: awake and alert Pain management: pain level controlled Vital Signs Assessment: post-procedure vital signs reviewed and stable Respiratory status: spontaneous breathing, nonlabored ventilation and respiratory function stable Cardiovascular status: stable Postop Assessment: no headache, no backache, epidural receding, no apparent nausea or vomiting, patient able to bend at knees, adequate PO intake and able to ambulate Anesthetic complications: no   No notable events documented.  Last Vitals:  Vitals:   03/30/21 0617 03/30/21 0735  BP:  (!) 146/91  Pulse:  77  Resp: 16 16  Temp:  36.7 C  SpO2:  100%    Last Pain:  Vitals:   03/30/21 0735  TempSrc: Oral  PainSc:    Pain Goal:                   Jabier Mutton

## 2021-03-30 NOTE — Lactation Note (Signed)
This note was copied from a baby's chart. Lactation Consultation Note  Patient Name: Tamara Reyes [id number] Date: 03/30/2021 Reason for consult: L&D Initial assessment;Primapara;Early term 37-38.6wks;Maternal endocrine disorder Age:25 hours  Baby had BF earlier before LC came into room for 30 min. Per RN. FOB holding baby. RN stated mom has GDM and baby was jittery.  [facility] taught mom how to hand express. LC collected 10 ml colostrum. Flowed easily from Lt. Breast. LC spoon fed baby 84m. May have lost only 1 ml colostrum baby spit out once.  Encouraged STS as much as possible. Will f/u mom on floor when transferred.  Maternal Data Has patient been taught Hand Expression?: Yes Does the patient have breastfeeding experience prior to this delivery?: No  Feeding    LATCH Score Latch: Grasps breast easily, tongue down, lips flanged, rhythmical sucking.  Audible Swallowing: A few with stimulation  Type of Nipple: Everted at rest and after stimulation  Comfort (Breast/Nipple): Soft / non-tender  Hold (Positioning): Assistance needed to correctly position infant at breast and maintain latch.  LATCH Score: 8   Lactation Tools Discussed/Used    Interventions Interventions: Expressed milk;Breast massage;Hand express  Discharge    Consult Status Consult Status: Follow-up from L&D Date: 03/30/21 Follow-up type: In-patient    Yamilex Borgwardt, LElta Guadeloupe8/17/2022, 12:01 AM

## 2021-03-30 NOTE — Lactation Note (Signed)
This note was copied from a baby's chart. Lactation Consultation Note  Patient Name: Boy Nolah Latshaw [id number] Date: 03/30/2021 Reason for consult: Initial assessment;Primapara;Early term 37-38.6wks;Maternal endocrine disorder Age:25 hours  Attempted to see mom. Mom sound asleep d/t Mag. FOB awake. [employer] set up DEBP at bedside for pumping for supplementation. Shells left at bedside d/t short shafted nipples.  Baby was given formula earlier d/t mom to sleepy. Mom will need initial consult to explain everything on BF and pumping. Lactation brochure left at bedside.  Maternal Data Has patient been taught Hand Expression?: Yes Does the patient have breastfeeding experience prior to this delivery?: No  Feeding    LATCH Score Latch: Repeated attempts needed to sustain latch, nipple held in mouth throughout feeding, stimulation needed to elicit sucking reflex.  Audible Swallowing: A few with stimulation  Type of Nipple: Everted at rest and after stimulation  Comfort (Breast/Nipple): Soft / non-tender  Hold (Positioning): Assistance needed to correctly position infant at breast and maintain latch.  LATCH Score: 7   Lactation Tools Discussed/Used Tools: Pump Breast pump type: Double-Electric Breast Pump Pump Education: Setup, frequency, and cleaning;Milk Storage Reason for Pumping: supplementation Pumping frequency: Q 3hr  Interventions Interventions: DEBP  Discharge Pump: DEBP  Consult Status Consult Status: Follow-up Date: 03/30/21 Follow-up type: In-patient    Dayanne Yiu, Elta Gu[place] 03/30/2021, 3:10 AM

## 2021-03-30 NOTE — Lactation Note (Signed)
This note was copied from a baby's chart. Lactation Consultation Note  Patient Name: Tamara Reyes [id number] Date: 03/30/2021 Reason for consult: Follow-up assessment;1st time breastfeeding;Primapara;Early term 37-38.6wks;Difficult latch Age:25 hours LC into dyad , per mom baby last fed at 1230 and baby took 84m with the yellow nipple. Baby has not stooled and has been spitty per RN and small amount when LC was changing the diaper.  Attempted to latch on the right breast after hand expressing , pre pumping and reverse pressure, no latch eventhough baby was wide awake and rooting.  LC recommended to the RN caring for mom and baby to go ahead and feed the baby with the nipple ( already has been fed with a bottle ) changing the nipple from the yellow to the slow flow purple . If that doesn't work call the NP or doctor for speech consult.  LC will be back to resize mom for #21 F .   Maternal Data Has patient been taught Hand Expression?: Yes  Feeding Mother's Current Feeding Choice: Breast Milk and Formula  LATCH Score Latch: Too sleepy or reluctant, no latch achieved, no sucking elicited.  Audible Swallowing: None  Type of Nipple: Everted at rest and after stimulation (semi compressible areolas / with edema)  Comfort (Breast/Nipple): Soft / non-tender  Hold (Positioning): Assistance needed to correctly position infant at breast and maintain latch.  LATCH Score: 5   Lactation Tools Discussed/Used Tools: Shells;Pump Breast pump type: Manual  Interventions Interventions: Breast feeding basics reviewed;Assisted with latch;Skin to skin;Breast massage;Hand express;Pre-pump if needed;Reverse pressure;Breast compression;Adjust position;Support pillows;Position options;Hand pump;DEBP;Shells;Education  Discharge    Consult Status Consult Status: Follow-up Date: 03/30/21 Follow-up type: In-patient    MBlanchard8/17/2022, 2:58 PM

## 2021-03-31 ENCOUNTER — Other Ambulatory Visit: Payer: Self-pay

## 2021-03-31 LAB — GLUCOSE, CAPILLARY
Glucose-Capillary: 100 mg/dL — ABNORMAL HIGH (ref 70–99)
Glucose-Capillary: 128 mg/dL — ABNORMAL HIGH (ref 70–99)
Glucose-Capillary: 147 mg/dL — ABNORMAL HIGH (ref 70–99)

## 2021-03-31 MED ORDER — NIFEDIPINE ER OSMOTIC RELEASE 60 MG PO TB24
90.0000 mg | ORAL_TABLET | Freq: Two times a day (BID) | ORAL | Status: DC
  Administered 2021-03-31 – 2021-04-01 (×2): 90 mg via ORAL
  Filled 2021-03-31 (×2): qty 1

## 2021-03-31 MED ORDER — NIFEDIPINE ER OSMOTIC RELEASE 30 MG PO TB24
30.0000 mg | ORAL_TABLET | Freq: Once | ORAL | Status: AC
  Administered 2021-03-31: 30 mg via ORAL
  Filled 2021-03-31: qty 1

## 2021-03-31 NOTE — Lactation Note (Signed)
This note was copied from a baby's chart. Lactation Consultation Note  Patient Name: Tamara Reyes [id number] Date: 03/31/2021 Reason for consult: Follow-up assessment;1st time breastfeeding;Primapara;Early term 37-38.6wks;Infant weight loss;Other (Comment) (4 % weight loss / at 31 hours Bili 7.3/ post circ) Age:25 hours Per mom the baby last fed at 2 pm. And mom plans to pump with the DEBP. Mom aware the volumes will increase.  LC reviewed the recommended plan. [name] praised mom for her efforts pumping and following the plan.  LC plan:  Continue to wear shells between feedings except when sleeping.  Offer the breast 1st / even if baby latches / supplement afterwards and gradually increase volumes upto 30 ml with the ( Nfant purple nipple ).  Post pump both breast with the DEBP for 15 mins / save milk for the next feeding.    Maternal Data Has patient been taught Hand Expression?: Yes  Feeding Mother's Current Feeding Choice: Breast Milk and Formula Nipple Type: Nfant Slow Flow (purple)  LATCH Score - LC did not see the baby eat / asleep.                     Lactation Tools Discussed/Used Tools: Shells;Pump;Flanges;Coconut oil Flange Size: 21;24 Breast pump type: Double-Electric Breast Pump;Manual Reason for Pumping: Early term/ sluggish to latch at 1st and LC plan Pumping frequency: every 3 hours Pumped volume: 10 mL  Interventions    Discharge    Consult Status Consult Status: Follow-up Date: 04/01/21 Follow-up type: In-patient    [place] 03/31/2021, 5:01 PM

## 2021-03-31 NOTE — Progress Notes (Signed)
Post Partum Day 2 Subjective: no complaints, up ad lib, voiding, tolerating PO, + flatus, and minimal bleeding  Objective: Blood pressure (!) 160/94, pulse 68, temperature 98.4 F (36.9 C), temperature source Oral, resp. rate 16, last menstrual period 07/09/2020, SpO2 98 %, unknown if currently breastfeeding.  Physical Exam:  General: alert, cooperative, and no distress Lochia: appropriate Uterine Fundus: firm Incision: healing well DVT Evaluation: No evidence of DVT seen on physical exam. No cords or calf tenderness. No significant calf/ankle edema.  Recent Labs    03/29/21 0853 03/29/21 1556  HGB 11.6* 11.2*  HCT 35.0* 35.2*    Assessment/Plan: CHTN with SIPE with Severe features - Bps were improving with titration to Procardia 60 XL and d/c had been anticipated.  - She then had Bps 160-170s/90-100s. Spoke with her nurse Delsa Sale - Labetalol pathway initiated and pt would not accept medication - Pt left to go outside. Discussed with Delsa Sale that we will readdress once the patient returns. We discussed Procardia 30 XL now and increase overall dose to 90 BID.  - This morning Ms. Ranney and I discussed the importance of blood pressure control to prevent stroke and heart attack and at that time she voiced understanding. We had discussed potential need for another stay tonight if blood pressure elevated.  - Offered to talk to the patient again, pt does not wish to talk to me at this time.   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 Consent reviewed and signed. Plan is for circ today.  - Breast and formula - Plan for birth control: Nuvaring   LOS: 3 days   Radene Gunning 03/31/2021, 11:58 AM

## 2021-03-31 NOTE — Progress Notes (Addendum)
Notified Dr. Damita Dunnings of BP's 174/103 and 160/94. Received new order to give procardia 30 mg now and to start the IV labetalol protocol to bring her BP's down below severe range. RN took procardia and IV labetalol dose to give to pt, but pt refused taking any more medicine at this time. Pt states she wants to go outside for 15 minutes to calm down before she takes any more medicine. RN went over risks of seizures and/or stroke if high BP's are left untreated. Dr. Damita Dunnings made aware of pt's refusal of taking BP meds. MD tried to discuss the risks of untreated high BP's over the phone, but pt did not want to speak with her. Pt states she will return in 15 minutes to take her blood pressure medicine.

## 2021-03-31 NOTE — Lactation Note (Signed)
This note was copied from a baby's chart. Lactation Consultation Note Attempted to see mom. Mom sound asleep,  Patient Name: Tamara Reyes Date: 03/31/2021   Age:25 hours  Maternal Data    Feeding    LATCH Score                    Lactation Tools Discussed/Used    Interventions    Discharge    Consult Status      Theodoro Kalata 03/31/2021, 6:54 AM

## 2021-03-31 NOTE — Clinical Social Work Maternal (Signed)
CLINICAL SOCIAL WORK MATERNAL/CHILD NOTE  Patient Details  Name: Tamara Reyes MRN: [id number] Date of Birth: Apr 12, 1996  Date:  03/31/2021  Clinical Social Worker Initiating Note:  Darra Lis, [place] Date/Time: Initiated:  03/31/21/1120     Child's Name:  Neysa Bonito   Biological Parents:  Mother, Father Michaell Cowing)   Need for Interpreter:  None   Reason for Referral:  Current Substance Use/Substance Use During Pregnancy  , Behavioral Health Concerns   Address:  [place] [address]    Phone number:  [phone] (home) [phone] (work)    Additional phone number:   Household Members/Support Persons (HM/SP):   Household Member/Support Person 1   HM/SP Name Relationship DOB or Age  HM/SP -1 Michaell Cowing Significant Other 11/06/1989  HM/SP -2        HM/SP -3        HM/SP -4        HM/SP -5        HM/SP -6        HM/SP -7        HM/SP -8          Natural Supports (not living in the home):  Immediate Family, Friends, Artist Supports: Therapist   Employment: Animator   Type of Work: [employer]   Education:  [employer] school graduate   Homebound arranged:    Museum/gallery curator Resources:  [employer]   Other Resources:  Physicist, medical     Cultural/Religious Considerations Which May Impact Care:    Strengths:  Ability to meet basic needs  , Engineer, materials, Home prepared for child     Psychotropic Medications:         Pediatrician:    [place] area  Pediatrician List:   [facility] ([address])  [place]      Pediatrician Fax Number:    Risk Factors/Current Problems:  Substance Use     Cognitive State:  Alert  , Goal Oriented  , Insightful     Mood/Affect:  Happy  , Bright  , Relaxed  , Interested  , Comfortable     CSW Assessment: CSW consulted for anxiety, depression and THC use. CSW met with MOB to  assess and offer support. CSW introduced self and role. CSW observed infant being held by [facility] and FOB bedside. MOB declined to have FOB leave the room for assessment, stating anything can be discussed with him present. CSW informed MOB of reason for consult and assessed current emotions. MOB appeared to be happy, smiling and engaged with CSW. FOB was also involved in the conversation. MOB reported she is doing well and the pregnancy went great. CSW inquired on MOB substance use. MOB reported she ate THC edibles to assist with her appetite. MOB stated she last had an edible around the end of July. MOB denies any additional substance use. CSW informed MOB of the hospital drug screen policy. MOB aware infant UDS is positive and the CDS will be followed. CSW informed parents that a [facility] CPS report will be made due to positive UDS. Parents expressed understanding and denied any questions.   CSW dicussed MOB mental health history. MOB disclosed she was diagnosed with anxiety and depression in 2014. MOB stated she had some anxiety during the pregnancy, related to the unknown of labor and delivery. CSW  validated MOB's feelings and discussed how she manages symptoms. MOB reported she goes to therapy with [facility], does yoga and was using THC to cope. MOB expressed the methods are helpful. MOB identified FOB, her mom and friend as supports. MOB denies any SI or HI.   CSW provided education regarding the baby blues period versus PPD and provided resources. CSW provided the New Mom Checklist and encouraged MOB to self evaluate and contact a medical professional if symptoms are noted at any time.  CSW provided review of Sudden Infant Death Syndrome (SIDS) precautions.  MOB has all infant needs, including a bassinet. MOB denies transportation barriers to care. MOB expressed no additional needs at this time.   CSW filed a CPS report with [place]. Infant CDS will be followed and an  additional report will be made if warranted. CSW identifies no further need for intervention and no barriers to discharge   CSW Plan/Description:  CSW Will Continue to Monitor Umbilical Cord Tissue Drug Screen Results and Make Report if Warranted, Child Protective Service Report  , [place] Walk, No Further Intervention Required/No Barriers to Discharge, Perinatal Mood and Anxiety Disorder (PMADs) Education, Sudden Infant Death Syndrome (SIDS) Education, Other Information/Referral to [employer], [place] 03/31/2021, 12:07 PM

## 2021-04-01 ENCOUNTER — Other Ambulatory Visit (HOSPITAL_COMMUNITY): Payer: Self-pay

## 2021-04-01 LAB — GLUCOSE, CAPILLARY
Glucose-Capillary: 110 mg/dL — ABNORMAL HIGH (ref 70–99)
Glucose-Capillary: 114 mg/dL — ABNORMAL HIGH (ref 70–99)
Glucose-Capillary: 147 mg/dL — ABNORMAL HIGH (ref 70–99)

## 2021-04-01 MED ORDER — IBUPROFEN 600 MG PO TABS
600.0000 mg | ORAL_TABLET | Freq: Four times a day (QID) | ORAL | 1 refills | Status: DC | PRN
  Filled 2021-04-01: qty 30, 8d supply, fill #0

## 2021-04-01 MED ORDER — NIFEDIPINE ER 90 MG PO TB24
90.0000 mg | ORAL_TABLET | Freq: Every day | ORAL | 1 refills | Status: DC
  Filled 2021-04-01: qty 30, 30d supply, fill #0

## 2021-04-01 MED ORDER — FUROSEMIDE 20 MG PO TABS
20.0000 mg | ORAL_TABLET | Freq: Two times a day (BID) | ORAL | 0 refills | Status: DC
  Filled 2021-04-01: qty 10, 5d supply, fill #0

## 2021-04-01 NOTE — Lactation Note (Signed)
This note was copied from a baby's chart. Lactation Consultation Note  Patient Name: Tamara Reyes [place] [id number] Date: 04/01/2021 Reason for consult: Follow-up assessment;Early term 37-38.6wks;Infant weight loss;Hyperbilirubinemia (PIH) Age:25 hours  LC reviewed with mother how to get a deep latch and ensure lips are flanged out listening for signs of milk transfer with breast compression.   Mom wearing breast shells in between use.  Mom pumping with DEBP 21 flange using coconut oil for nipple care. Mom adequate volume from pumping and will discontinue use of formula and offer EBM.  Mom nipples show no signs of compression or trauma. Mom breast are soft with no signs of plugged duct or areas of hardness.   Plan 1. To feed based on cues 8-12x in24 hr period, SLP stated not let more than 3 hrs go without a feeding.  2. Mom to offer breast every other feed and supplement with EBM 30 ml or more with nfant nipple and pace bottle feeding in sideline.  3. Mom medela pump at home will pump q3 hr for 15 min 4. LC reviewed inpatient and outpatient services and brochure provided.  All questions answered at the end of the visit.   Maternal Data    Feeding Mother's Current Feeding Choice: Breast Milk and Formula Nipple Type: Nfant Slow Flow (purple)  LATCH Score    Lactation Tools Discussed/Used Tools: Shells;Pump;Flanges;Coconut oil (LC reviewed with mother wearing shells when not pumping, sleeping or nursing. Mom to wash parts in between use.) Flange Size: 21 Breast pump type: Double-Electric Breast Pump Pump Education: Setup, frequency, and cleaning;Milk Storage Reason for Pumping: increase stimulation Pumping frequency: every 3 hrs for 15 min  Interventions Interventions: Breast feeding basics reviewed;Breast compression;Assisted with latch;Adjust position;Skin to skin;Support pillows;DEBP;Breast massage;Position options;Hand express;Expressed milk;Education;Coconut oil;Pace  feeding;Shells  Discharge Discharge Education: Engorgement and breast care;Warning signs for feeding baby Pump: Personal  Consult Status Consult Status: Complete Date: 04/02/21 Follow-up type: In-patient    Azaela Caracci  [place] 04/01/2021, 12:05 PM

## 2021-04-01 NOTE — Progress Notes (Signed)
Pt given discharge instructions and all questions answered. IV discontinued.

## 2021-04-04 ENCOUNTER — Telehealth: Payer: Self-pay

## 2021-04-04 NOTE — Telephone Encounter (Signed)
Transition Care Management Unsuccessful Follow-up Telephone Call  Date of discharge and from where:  04/01/2021-[facility]   Attempts:  1st Attempt  Reason for unsuccessful TCM follow-up call:  Left voice message

## 2021-04-05 ENCOUNTER — Ambulatory Visit (INDEPENDENT_AMBULATORY_CARE_PROVIDER_SITE_OTHER): Payer: Medicaid Other

## 2021-04-05 ENCOUNTER — Other Ambulatory Visit: Payer: Medicaid Other

## 2021-04-05 ENCOUNTER — Other Ambulatory Visit: Payer: Self-pay

## 2021-04-05 VITALS — BP 132/96 | HR 101 | Wt 181.7 lb

## 2021-04-05 DIAGNOSIS — Z0130 Encounter for examination of blood pressure without abnormal findings: Secondary | ICD-10-CM

## 2021-04-05 NOTE — Progress Notes (Signed)
Chart reviewed for nurse visit. Agree with plan of care.   Clarnce Flock, MD 04/05/21 1:11 PM

## 2021-04-05 NOTE — Telephone Encounter (Signed)
Transition Care Management Unsuccessful Follow-up Telephone Call  Date of discharge and from where:  04/01/2021 from [facility]  Attempts:  2nd Attempt  Reason for unsuccessful TCM follow-up call:  Left voice message

## 2021-04-05 NOTE — Progress Notes (Signed)
Here following vaginal delivery on 03/29/21. Pt was induced due to chronic hypertension with super imposed pre-eclampsia and type 2 diabetes. Denies headache, vision changes, or dizziness today. Pitting edema to bilateral lower legs that resolves after a short time. BP today is 132/96, HR 101. Reports taking nifedipine last on 04/02/21. Denies taking Lasix. Reviewed with Lamount Cohen, MD who recommends pt begin taking Nifedipine 90 mg daily. Recommends patient take Lasix if swelling in legs is uncomfortable for pt. Requests pt return in 1 week. Reviewed provider recommendation with patient. Explained that patient is still at risk for postpartum complications due to uncontrolled hypertension. Reiterated importance of taking BP med daily.  Patient reports constipation. Currently managing with sennoside supplement, Miralax, and warm fruit juice. Pt satisfied with current regimen and would not like additional recommendation. Reports continued acid reflux, pt states she is out of medication. Per chart review, Prilosec was filled on 03/18/21. Patient does not recall. Encouraged pt to look for medication at home, if unable to find, pt will call pharmacy for refill.  Babyscripts data:   Tamara Reyes 04/05/21

## 2021-04-06 NOTE — Telephone Encounter (Signed)
Transition Care Management Unsuccessful Follow-up Telephone Call  Date of discharge and from where:  04/01/2021-[facility]   Attempts:  3rd Attempt  Reason for unsuccessful TCM follow-up call:  Left voice message

## 2021-04-09 ENCOUNTER — Telehealth (HOSPITAL_COMMUNITY): Payer: Self-pay

## 2021-04-09 NOTE — Telephone Encounter (Signed)
"  I'm doing pretty good. Everything is going well." Patient declines questions or concerns about her healing.  "He is doing good. His appetite is increasing. He is eating good. He had his 1st appointment this wek and it was good. He sleeps in a bassinet."RN reviewed ABC's of safe sleep with patient. Patient declines any questions or concerns about baby.   EPDS score is 4.   Tamara Reyes [facility] 04/09/2021,1417

## 2021-04-11 ENCOUNTER — Ambulatory Visit: Payer: Medicaid Other

## 2021-04-25 ENCOUNTER — Telehealth: Payer: Self-pay | Admitting: Pharmacy Technician

## 2021-04-25 NOTE — Telephone Encounter (Addendum)
Patient Advocate Encounter   Received notification from [employer] that prior authorization for [place] is required.   PA submitted on 04/25/2021 [place] [id number] Status is APPROVED  PA CASE #:  [id number] [facility] 10/22/2021    [facility] will continue to follow   Ronney Asters, CPhT Patient Advocate [facility] Phone: [phone] Fax:  [phone]

## 2021-05-02 ENCOUNTER — Other Ambulatory Visit: Payer: Self-pay

## 2021-05-02 ENCOUNTER — Ambulatory Visit: Payer: Medicaid Other | Admitting: Clinical

## 2021-05-02 ENCOUNTER — Ambulatory Visit (INDEPENDENT_AMBULATORY_CARE_PROVIDER_SITE_OTHER): Payer: Medicaid Other | Admitting: Student

## 2021-05-02 DIAGNOSIS — F411 Generalized anxiety disorder: Secondary | ICD-10-CM

## 2021-05-02 DIAGNOSIS — I10 Essential (primary) hypertension: Secondary | ICD-10-CM

## 2021-05-02 DIAGNOSIS — Z1331 Encounter for screening for depression: Secondary | ICD-10-CM

## 2021-05-02 DIAGNOSIS — E119 Type 2 diabetes mellitus without complications: Secondary | ICD-10-CM | POA: Diagnosis not present

## 2021-05-02 MED ORDER — ESCITALOPRAM OXALATE 10 MG PO TABS: 10.0000 mg | ORAL_TABLET | Freq: Every day | ORAL | 0 refills | Status: DC

## 2021-05-02 NOTE — BH Specialist Note (Signed)
Less than 15 minute warm hand-off, introduction to integrated behavioral health services; pt agrees to referral to [facility] and virtual consult with [facility] Katherinne Mofield on Monday, 05/09/21 at 1:45pm. Pt stopped seeing a therapist in pregnancy. Pt will begin back on Lexapro today, as prescribed.

## 2021-05-02 NOTE — Progress Notes (Signed)
[name]  Tamara Reyes is a 25 y.o. G6P1001 female who presents for a postpartum visit. She is 5 weeks postpartum following a normal spontaneous vaginal delivery.  I have fully reviewed the prenatal and intrapartum course. The delivery was at 37.4 gestational weeks.  Anesthesia: epidural. Postpartum course has been normal. Baby is doing well. Baby is feeding by both breast and bottle - Tamara Reyes . Bleeding staining only. Bowel function is normal. Bladder function is normal. Patient is not sexually active. Contraception method is none. Postpartum depression screening: positive.   The pregnancy intention screening data noted above was reviewed. Potential methods of contraception were discussed. The patient elected to proceed with No data recorded.    Health Maintenance Due  Topic Date Due   COVID-19 Vaccine (1) Never done   HPV VACCINES (1 - 2-dose series) Never done   FOOT EXAM  09/07/2020   OPHTHALMOLOGY EXAM  09/11/2020   INFLUENZA VACCINE  Never done    The following portions of the patient's history were reviewed and updated as appropriate: allergies, current medications, past family history, past medical history, past social history, past surgical history, and problem list.  Review of Systems Constitutional: negative Cardiovascular: negative Gastrointestinal: negative Genitourinary:negative Behavioral/Psych: positive for anxiety  Objective:  LMP 07/09/2020    General:  alert, cooperative, appears stated age, and no distress   Breasts:  not indicated  Lungs: clear to auscultation bilaterally  Heart:  regular rate and rhythm, S1, S2 normal, no murmur, click, rub or gallop  Abdomen: soft, non-tender; bowel sounds normal; no masses,  no organomegaly   Wound N/a  GU exam:  not indicated       Assessment:   1. Encounter for routine postpartum follow-up   2. Chronic hypertension  -pt hasn't taken meds since 2 weeks PP. BP today is mildly elevated per new  guidelines. Pt states she doesn't intend to continue BP meds. She has a PCP & plans to call to make an appointment for follow up  3. Type 2 diabetes mellitus without complication, without long-term current use of insulin ([place])  -reports taking insulin prescribed by her pcp & reports good control. Goes to Juluis Mire with [facility] [facility] on close follow up with her.   4. Positive screening for depression on 2-item Patient Health Questionnaire (PHQ-2)   5. Generalized anxiety disorder  -was previously on Lexapro 10 mg & would like to restart as she reports the zoloft is not effective. Will switch to Lexapro. Rx 1 month supply as her PCP will manage it further.  -Saw Jamie today & will be referred to [facility]     Normal postpartum exam.   Plan:   Essential components of care per ACOG recommendations:  1.  Mood and well being: Patient with positive depression screening today. Reviewed local resources for support.  - Patient tobacco use? No.   - hx of drug use? No.    2. Infant care and feeding:  -Patient currently breastmilk feeding? Feeding formula & breast milk by bottle  -Social determinants of health (SDOH) reviewed in EPIC. No concerns  3. Sexuality, contraception and birth spacing - Patient does not want a pregnancy in the next year.  Desired family size is 2 children.  - Reviewed forms of contraception in tiered fashion. Patient unsure about contraception at this time & given handouts. Will f/u with [place] or her PCP for contraception once she makes a decision. Knows to use condoms in  the interim.   - Discussed birth spacing of 18 months  4. Sleep and fatigue -Encouraged family/partner/community support of 4 hrs of uninterrupted sleep to help with mood and fatigue  5. Physical Recovery  - Discussed patients delivery and complications. She describes her labor as good. - Patient had a Vaginal, no problems at delivery. Patient had a  unrepaired perineal  laceration.  Perineal healing reviewed. Patient expressed understanding - Patient has urinary incontinence? No. - Patient is safe to resume physical and sexual activity  6.  Health Maintenance - HM due items addressed Yes - Last pap smear  Diagnosis  Date Value Ref Range Status  10/16/2019   Final   - Negative for intraepithelial lesion or malignancy (NILM)   Pap smear not done at today's visit.  -Breast Cancer screening indicated? No.   7. Chronic Disease/Pregnancy Condition follow up: Hypertension and diabetes  - PCP follow up  Tamara Guild, NP [facility]

## 2021-05-03 ENCOUNTER — Ambulatory Visit: Payer: Self-pay | Admitting: Family Medicine

## 2021-05-03 NOTE — BH Specialist Note (Signed)
Pt did not arrive to video visit and did not answer the phone; Left HIPPA-compliant message to call back Tamara Reyes from [facility] at  [phone] (Isay Perleberg's office).  ?; left MyChart message for patient.  ? ?

## 2021-05-09 ENCOUNTER — Ambulatory Visit: Payer: HRSA Program | Admitting: Clinical

## 2021-05-09 DIAGNOSIS — Z91199 Patient's noncompliance with other medical treatment and regimen due to unspecified reason: Secondary | ICD-10-CM

## 2021-05-09 DIAGNOSIS — Z5329 Procedure and treatment not carried out because of patient's decision for other reasons: Secondary | ICD-10-CM

## 2021-05-10 ENCOUNTER — Telehealth: Payer: Self-pay | Admitting: Pharmacy Technician

## 2021-05-10 NOTE — Telephone Encounter (Signed)
Patient Advocate Encounter   Received notification from [place] that prior authorization for Dexcom G6 Sensor is required.   PA submitted on 05/10/21 [place] Status is pending    [facility] will continue to follow   Burney Gauze, CPhT Patient [facility] Phone: [phone] Fax:  [phone]

## 2021-05-11 ENCOUNTER — Other Ambulatory Visit: Payer: Self-pay

## 2021-05-11 ENCOUNTER — Encounter (INDEPENDENT_AMBULATORY_CARE_PROVIDER_SITE_OTHER): Payer: Self-pay | Admitting: Primary Care

## 2021-05-11 ENCOUNTER — Ambulatory Visit (INDEPENDENT_AMBULATORY_CARE_PROVIDER_SITE_OTHER): Payer: Medicaid Other | Admitting: Primary Care

## 2021-05-11 VITALS — BP 120/76 | HR 117 | Temp 97.7°F | Ht 68.0 in | Wt 175.0 lb

## 2021-05-11 DIAGNOSIS — E119 Type 2 diabetes mellitus without complications: Secondary | ICD-10-CM | POA: Diagnosis not present

## 2021-05-11 DIAGNOSIS — F418 Other specified anxiety disorders: Secondary | ICD-10-CM

## 2021-05-11 DIAGNOSIS — Z23 Encounter for immunization: Secondary | ICD-10-CM

## 2021-05-11 LAB — POCT GLYCOSYLATED HEMOGLOBIN (HGB A1C): Hemoglobin A1C: 6.5 % — AB (ref 4.0–5.6)

## 2021-05-11 MED ORDER — BUSPIRONE HCL 5 MG PO TABS
5.0000 mg | ORAL_TABLET | Freq: Two times a day (BID) | ORAL | 1 refills | Status: DC
  Filled 2021-05-11: qty 60, 30d supply, fill #0

## 2021-05-11 MED ORDER — SERTRALINE HCL 100 MG PO TABS
100.0000 mg | ORAL_TABLET | Freq: Every day | ORAL | 3 refills | Status: DC
  Filled 2021-05-11: qty 90, 90d supply, fill #0

## 2021-05-11 MED ORDER — TRULICITY 0.75 MG/0.5ML ~~LOC~~ SOAJ
0.7500 mg | SUBCUTANEOUS | 5 refills | Status: DC
  Filled 2021-05-11 – 2021-10-24 (×6): qty 2, 28d supply, fill #0

## 2021-05-11 NOTE — Progress Notes (Signed)
[facility]  Subjective:  Patient ID: Tamara Reyes, female    DOB: Jun 25, 1996  Age: 25 y.o. MRN: [id number]  CC: Medication Management and Diabetes   HPI [name] presents for follow-up of diabetes. Patient does not check blood sugar at home  Compliant with meds - Yes Checking CBGs? No  Fasting avg -   Postprandial average -  Exercising regularly? - Yes Watching carbohydrate intake? - Yes Neuropathy ? - No Hypoglycemic events - No  - Recovers with :   Pertinent ROS:  Polyuria - Yes Polydipsia - Yes Vision problems - Yes  Medications as noted below. Taking them regularly without complication/adverse reaction being reported today.   History Tamara Reyes has a past medical history of Anxiety, Asthma, COVID-19 (08/16/2020), DM (diabetes mellitus), type 2 ([place]), Fibromyalgia, Genital herpes, History of marijuana use (2021), Hypercholesteremia, Hypertension, Nexplanon insertion (08/26/2013), and PCOS (polycystic ovarian syndrome).   She has a past surgical history that includes Tonsillectomy and adenoidectomy; Tonsillectomy; and abscess removal .   Her family history includes Diabetes in her mother and paternal grandmother; Hypertension in her maternal grandfather, maternal grandmother, mother, and paternal grandmother.She reports that she quit smoking about 6 years ago. Her smoking use included cigarettes. She has never used smokeless tobacco. She reports that she does not currently use drugs after having used the following drugs: Marijuana. She reports that she does not drink alcohol.  Current Outpatient Medications on File Prior to Visit  Medication Sig Dispense Refill   albuterol (VENTOLIN HFA) 108 (90 Base) MCG/ACT inhaler Inhale 2 puffs into the lungs every 6 (six) hours as needed for wheezing or shortness of breath. (Patient not taking: No sig reported) 8.5 g 1   omeprazole (PRILOSEC) 20 MG capsule TAKE 1 CAPSULE (20 MG TOTAL) BY MOUTH IN THE MORNING AND AT  BEDTIME. (Patient not taking: No sig reported) 30 capsule 3   valACYclovir (VALTREX) 500 MG tablet Take 1 tablet (500 mg total) by mouth daily. (Patient not taking: No sig reported) 30 tablet 12   [DISCONTINUED] insulin aspart (NOVOLOG) 100 UNIT/ML injection Inject 8 Units into the skin 3 (three) times daily before meals. (Patient not taking: Reported on 09/17/2020) 10 mL 12   No current facility-administered medications on file prior to visit.    ROS Review of Systems  Psychiatric/Behavioral:  Positive for behavioral problems. The patient is nervous/anxious.        Depression   All other systems reviewed and are negative.  Objective:  BP 120/76 (BP Location: Right Arm, Patient Position: Sitting, Cuff Size: Normal)   Pulse (!) 117   Temp 97.7 F (36.5 C) (Temporal)   Ht 5\' 8"  (1.727 m)   Wt 175 lb (79.4 kg)   LMP 05/08/2021 (Exact Date)   SpO2 100%   Breastfeeding No   BMI 26.61 kg/m   BP Readings from Last 3 Encounters:  05/11/21 120/76  05/02/21 138/89  04/05/21 (!) 132/96    Wt Readings from Last 3 Encounters:  05/11/21 175 lb (79.4 kg)  05/02/21 177 lb 12.8 oz (80.6 kg)  04/05/21 181 lb 11.2 oz (82.4 kg)    Physical Exam Physical exam: General: Vital signs reviewed.  Patient is well-developed and well-nourished,  in no acute distress and cooperative with exam. Head: Normocephalic and atraumatic. Eyes: EOMI, conjunctivae normal, no scleral icterus. Neck: Supple, trachea midline, normal ROM, no JVD, masses, thyromegaly, or carotid bruit present. Cardiovascular: RRR, S1 normal, S2 normal, no murmurs, gallops, or rubs. Pulmonary/Chest: Clear to auscultation bilaterally,  no wheezes, rales, or rhonchi. Abdominal: Soft, non-tender, non-distended, BS +, no masses, organomegaly, or guarding present. Musculoskeletal: No joint deformities, erythema, or stiffness, ROM full and nontender. Extremities: No lower extremity edema bilaterally,  pulses symmetric and intact  bilaterally. No cyanosis or clubbing. Neurological: A&O x3, Strength is normal Skin: Warm, dry and intact. No rashes or erythema. Psychiatric: Normal mood and affect. speech and behavior is normal. Cognition and memory are normal.    Lab Results  Component Value Date   HGBA1C 6.5 (A) 05/11/2021   HGBA1C 6.5 (H) 11/18/2020   HGBA1C 8.5 (H) 09/22/2020    Lab Results  Component Value Date   WBC 15.6 (H) 03/29/2021   HGB 11.2 (L) 03/29/2021   HCT 35.2 (L) 03/29/2021   PLT 243 03/29/2021   GLUCOSE 101 (H) 03/29/2021   CHOL 258 (H) 03/05/2020   TRIG 104 03/05/2020   HDL 54 03/05/2020   LDLCALC 186 (H) 03/05/2020   ALT 10 03/29/2021   AST 16 03/29/2021   NA 137 03/29/2021   K 3.8 03/29/2021   CL 105 03/29/2021   CREATININE 0.77 03/29/2021   BUN 6 03/29/2021   CO2 21 (L) 03/29/2021   TSH 0.524 01/18/2021   HGBA1C 6.5 (A) 05/11/2021   MICROALBUR 3.0 (H) 10/21/2020     Assessment & Plan:   Tamara Reyes was seen today for medication management and diabetes.  Diagnoses and all orders for this visit:  Type 2 diabetes mellitus without complication, long-term current use of insulin (HCC) -     HgB A1c 6.5 prediabetic on Trulicity .75mg  weekly  exercising and monitoring  foods that are high in carbohydrates are the following rice, potatoes, breads, sugars, and pastas.  Reduction in the intake (eating) will assist in lowering your blood sugars.   Need for immunization against influenza -     Flu Vaccine QUAD 47mo+IM (Fluarix, Fluzone & Alfiuria Quad PF)  Depression with anxiety Flowsheet Row Office Visit from 05/11/2021 in [facility]  PHQ-9 Total Score 14       Other orders -     busPIRone (BUSPAR) 5 MG tablet; Take 1 tablet (5 mg total) by mouth 2 (two) times daily. -     sertraline (ZOLOFT) 100 MG tablet; Take 1 tablet (100 mg total) by mouth daily.  I have discontinued Tamara Reyes's prenatal multivitamin, sertraline, NIFEdipine, furosemide, ibuprofen,  cyclobenzaprine, and escitalopram. I am also having her start on busPIRone, sertraline, and Trulicity. Additionally, I am having her maintain her valACYclovir, omeprazole, and albuterol.  Meds ordered this encounter  Medications   busPIRone (BUSPAR) 5 MG tablet    Sig: Take 1 tablet (5 mg total) by mouth 2 (two) times daily.    Dispense:  60 tablet    Refill:  1   sertraline (ZOLOFT) 100 MG tablet    Sig: Take 1 tablet (100 mg total) by mouth daily.    Dispense:  90 tablet    Refill:  3   Dulaglutide (TRULICITY) 4.31 VQ/0.0QQ SOPN    Sig: Inject 0.75 mg into the skin once a week.    Dispense:  2 mL    Refill:  5     Follow-up:   Return in about 6 weeks (around 06/22/2021) for medication f/u.  The above assessment and management plan was discussed with the patient. The patient verbalized understanding of and has agreed to the management plan. Patient is aware to call the clinic if symptoms fail to improve or worsen. Patient  is aware when to return to the clinic for a follow-up visit. Patient educated on when it is appropriate to go to the emergency department.   Juluis Mire, NP-C

## 2021-05-11 NOTE — Patient Instructions (Addendum)
Influenza, Adult Influenza is also called "the flu." It is an infection in the lungs, nose, and throat (respiratory tract). It spreads easily from person to person (is contagious). The flu causes symptoms that are like a cold, along with high fever and body aches. What are the causes? This condition is caused by the influenza virus. You can get the virus by: Breathing in droplets that are in the air after a person infected with the flu coughed or sneezed. Touching something that has the virus on it and then touching your mouth, nose, or eyes. What increases the risk? Certain things may make you more likely to get the flu. These include: Not washing your hands often. Having close contact with many people during cold and flu season. Touching your mouth, eyes, or nose without first washing your hands. Not getting a flu shot every year. You may have a higher risk for the flu, and serious problems, such as a lung infection (pneumonia), if you: Are older than 65. Are pregnant. Have a weakened disease-fighting system (immune system) because of a disease or because you are taking certain medicines. Have a long-term (chronic) condition, such as: Heart, kidney, or lung disease. Diabetes. Asthma. Have a liver disorder. Are very overweight (morbidly obese). Have anemia. What are the signs or symptoms? Symptoms usually begin suddenly and last 4-14 days. They may include: Fever and chills. Headaches, body aches, or muscle aches. Sore throat. Cough. Runny or stuffy (congested) nose. Feeling discomfort in your chest. Not wanting to eat as much as normal. Feeling weak or tired. Feeling dizzy. Feeling sick to your stomach or throwing up. How is this treated? If the flu is found early, you can be treated with antiviral medicine. This can help to reduce how bad the illness is and how long it lasts. This may be given by mouth or through an IV tube. Taking care of yourself at home can help your  symptoms get better. Your doctor may want you to: Take over-the-counter medicines. Drink plenty of fluids. The flu often goes away on its own. If you have very bad symptoms or other problems, you may be treated in a hospital. Follow these instructions at home:   Activity Rest as needed. Get plenty of sleep. Stay home from work or school as told by your doctor. Do not leave home until you do not have a fever for 24 hours without taking medicine. Leave home only to go to your doctor. Eating and drinking Take an ORS (oral rehydration solution). This is a drink that is sold at pharmacies and stores. Drink enough fluid to keep your pee pale yellow. Drink clear fluids in small amounts as you are able. Clear fluids include: Water. Ice chips. Fruit juice mixed with water. Low-calorie sports drinks. Eat bland foods that are easy to digest. Eat small amounts as you are able. These foods include: Bananas. Applesauce. Rice. Lean meats. Toast. Crackers. Do not eat or drink: Fluids that have a lot of sugar or caffeine. Alcohol. Spicy or fatty foods. General instructions Take over-the-counter and prescription medicines only as told by your doctor. Use a cool mist humidifier to add moisture to the air in your home. This can make it easier for you to breathe. When using a cool mist humidifier, clean it daily. Empty water and replace with clean water. Cover your mouth and nose when you cough or sneeze. Wash your hands with soap and water often and for at least 20 seconds. This is also important after  you cough or sneeze. If you cannot use soap and water, use alcohol-based hand sanitizer. Keep all follow-up visits. How is this prevented?  Get a flu shot every year. You may get the flu shot in late summer, fall, or winter. Ask your doctor when you should get your flu shot. Avoid contact with people who are sick during fall and winter. This is cold and flu season. Contact a doctor if: You get  new symptoms. You have: Chest pain. Watery poop (diarrhea). A fever. Your cough gets worse. You start to have more mucus. You feel sick to your stomach. You throw up. Get help right away if you: Have shortness of breath. Have trouble breathing. Have skin or nails that turn a bluish color. Have very bad pain or stiffness in your neck. Get a sudden headache. Get sudden pain in your face or ear. Cannot eat or drink without throwing up. These symptoms may represent a serious problem that is an emergency. Get medical help right away. Call your local emergency services (911 in the [place] Do not wait to see if the symptoms will go away. Do not drive yourself to the hospital. Summary Influenza is also called "the flu." It is an infection in the lungs, nose, and throat. It spreads easily from person to person. Take over-the-counter and prescription medicines only as told by your doctor. Getting a flu shot every year is the best way to not get the flu. This information is not intended to replace advice given to you by your health care provider. Make sure you discuss any questions you have with your health care provider. Document Revised: 03/19/2020 Document Reviewed: 03/19/2020 Elsevier Patient Education  [place] Injection What is this medication? DULAGLUTIDE (DOO la GLOO tide) treats type 2 diabetes. It works by increasing insulin levels in your body, which decreases your blood sugar (glucose). It also reduces the amount of sugar released into your blood and slows down your digestion. It can also be used to lower the risk of heart attack and stroke in people with type 2 diabetes. Changes to diet and exercise are often combined with this medication. This medicine may be used for other purposes; ask your health care provider or pharmacist if you have questions. COMMON BRAND NAME(S): Trulicity What should I tell my care team before I take this medication? They need to  know if you have any of these conditions: Endocrine tumors (MEN 2) or if someone in your family had these tumors Eye disease, vision problems History of pancreatitis Kidney disease Liver disease Stomach or intestine problems Thyroid cancer or if someone in your family had thyroid cancer An unusual or allergic reaction to dulaglutide, other medications, foods, dyes, or preservatives Pregnant or trying to get pregnant Breast-feeding How should I use this medication? This medication is injected under the skin. You will be taught how to prepare and give it. Take it as directed on the prescription label on the same day of each week. Do NOT prime the pen. Keep taking it unless your care team tells you to stop. If you use this medication with insulin, you should inject this medication and the insulin separately. Do not mix them together. Do not give the injections right next to each other. Change (rotate) injection sites with each injection. This medication comes with INSTRUCTIONS FOR USE. Ask your pharmacist for directions on how to use this medication. Read the information carefully. Talk to your pharmacist or care team if you have questions. It  is important that you put your used needles and syringes in a special sharps container. Do not put them in a trash can. If you do not have a sharps container, call your pharmacist or care team to get one. A special MedGuide will be given to you by the pharmacist with each prescription and refill. Be sure to read this information carefully each time. Talk to your care team about the use of this medication in children. Special care may be needed. Overdosage: If you think you have taken too much of this medicine contact a poison control center or emergency room at once. NOTE: This medicine is only for you. Do not share this medicine with others. What if I miss a dose? If you miss a dose, take it as soon as you can unless it is more than 3 days late. If it is  more than 3 days late, skip the missed dose. Take the next dose at the normal time. What may interact with this medication? Other medications for diabetes Many medications may cause changes in blood sugar, these include: Alcohol containing beverages Antiviral medications for HIV or AIDS Aspirin and aspirin-like medications Certain medications for blood pressure, heart disease, irregular heart beat Chromium Diuretics Female hormones, such as estrogens or progestins, birth control pills Fenofibrate Gemfibrozil Isoniazid Lanreotide Female hormones or anabolic steroids MAOIs like Carbex, Eldepryl, Marplan, Nardil, and Parnate Medications for allergies, asthma, cold, or cough Medications for depression, anxiety, or psychotic disturbances Medications for weight loss Niacin Nicotine NSAIDs, medications for pain and inflammation, like ibuprofen or naproxen Octreotide Pasireotide Pentamidine Phenytoin Probenecid Quinolone antibiotics such as ciprofloxacin, levofloxacin, ofloxacin Some herbal dietary supplements Steroid medications such as prednisone or cortisone Sulfamethoxazole; trimethoprim Thyroid hormones Some medications can hide the warning symptoms of low blood sugar (hypoglycemia). You may need to monitor your blood sugar more closely if you are taking one of these medications. These include: Beta-blockers, often used for high blood pressure or heart problems (examples include atenolol, metoprolol, propranolol) Clonidine Guanethidine Reserpine This list may not describe all possible interactions. Give your health care provider a list of all the medicines, herbs, non-prescription drugs, or dietary supplements you use. Also tell them if you smoke, drink alcohol, or use illegal drugs. Some items may interact with your medicine. What should I watch for while using this medication? Visit your care team for regular checks on your progress. Check with your care team if you have severe  diarrhea, nausea, and vomiting, or if you sweat a lot. The loss of too much body fluid may make it dangerous for you to take this medication. A test called the HbA1C (A1C) will be monitored. This is a simple blood test. It measures your blood sugar control over the last 2 to 3 months. You will receive this test every 3 to 6 months. Learn how to check your blood sugar. Learn the symptoms of low and high blood sugar and how to manage them. Always carry a quick-source of sugar with you in case you have symptoms of low blood sugar. Examples include hard sugar candy or glucose tablets. Make sure others know that you can choke if you eat or drink when you develop serious symptoms of low blood sugar, such as seizures or unconsciousness. Get medical help at once. Tell your care team if you have high blood sugar. You might need to change the dose of your medication. If you are sick or exercising more than usual, you may need to change the dose of  your medication. Do not skip meals. Ask your care team if you should avoid alcohol. Many nonprescription cough and cold products contain sugar or alcohol. These can affect blood sugar. Pens should never be shared. Even if the needle is changed, sharing may result in passing of viruses like hepatitis or HIV. Wear a medical ID bracelet or chain. Carry a card that describes your condition. List the medications and doses you take on the card. What side effects may I notice from receiving this medication? Side effects that you should report to your care team as soon as possible: Allergic reactions-skin rash, itching, hives, swelling of the face, lips, tongue, or throat Change in vision Dehydration-increased thirst, dry mouth, feeling faint or lightheaded, headache, dark yellow or brown urine Kidney injury-decrease in the amount of urine, swelling of the ankles, hands, or feet Pancreatitis-severe stomach pain that spreads to your back or gets worse after eating or when  touched, fever, nausea, vomiting Thyroid cancer-new mass or lump in the neck, pain or trouble swallowing, trouble breathing, hoarseness Side effects that usually do not require medical attention (report to your care team if they continue or are bothersome): Diarrhea Loss of appetite Nausea Stomach pain Vomiting This list may not describe all possible side effects. Call your doctor for medical advice about side effects. You may report side effects to FDA at 1-800-FDA-1088. Where should I keep my medication? Keep out of the reach of children and pets. Refrigeration (preferred): Store unopened pens in a refrigerator between 2 and 8 degrees C (36 and 46 degrees F). Keep it in the original carton until you are ready to take it. Do not freeze or use if the medication has been frozen. Protect from light. Get rid of any unused medication after the expiration date on the label. Room Temperature: The pen may be stored at room temperature below 30 degrees C (86 degrees F) for up to a total of 14 days if needed. Protect from light. Avoid exposure to extreme heat. If it is stored at room temperature, throw away any unused medication after 14 days or after it expires, whichever is first. To get rid of medications that are no longer needed or have expired: Take the medication to a medication take-back program. Check with your pharmacy or law enforcement to find a location. If you cannot return the medication, ask your pharmacist or care team how to get rid of this medication safely. NOTE: This sheet is a summary. It may not cover all possible information. If you have questions about this medicine, talk to your doctor, pharmacist, or health care provider.  2022 Elsevier/Gold Standard (2020-09-27 14:13:14)

## 2021-05-12 ENCOUNTER — Other Ambulatory Visit (HOSPITAL_COMMUNITY): Payer: Self-pay

## 2021-05-16 ENCOUNTER — Telehealth: Payer: Self-pay

## 2021-05-16 ENCOUNTER — Other Ambulatory Visit: Payer: Self-pay

## 2021-05-16 NOTE — Telephone Encounter (Signed)
PA FOR TRULICITY APPROVED UNTIL 05/16/22

## 2021-05-18 ENCOUNTER — Other Ambulatory Visit: Payer: Self-pay

## 2021-05-23 ENCOUNTER — Other Ambulatory Visit: Payer: Self-pay

## 2021-06-22 ENCOUNTER — Other Ambulatory Visit: Payer: Self-pay

## 2021-06-22 ENCOUNTER — Ambulatory Visit (INDEPENDENT_AMBULATORY_CARE_PROVIDER_SITE_OTHER): Payer: Medicaid Other | Admitting: Primary Care

## 2021-06-22 ENCOUNTER — Encounter (INDEPENDENT_AMBULATORY_CARE_PROVIDER_SITE_OTHER): Payer: Self-pay | Admitting: Primary Care

## 2021-06-22 DIAGNOSIS — E119 Type 2 diabetes mellitus without complications: Secondary | ICD-10-CM

## 2021-06-22 DIAGNOSIS — D563 Thalassemia minor: Secondary | ICD-10-CM

## 2021-06-22 NOTE — Patient Instructions (Signed)
Thalassemia Thalassemia is a blood disorder that causes a low level of red blood cells (anemia). This condition is passed from parent to child (inherited) through gene mutations. These are abnormal changes to genes. The mutations make it hard for a person's body to make the protein in red blood cells (hemoglobin) that carries oxygen from the lungs to the rest of the body. Red blood cells do not live long without hemoglobin. Loss of red blood cells leads to anemia, which is the main symptom of thalassemia. There are two main types of thalassemia. The type depends on which part of the hemoglobin is affected. Alpha thalassemia affects the alpha part of the hemoglobin. This is caused by four genes. You could get two from each parent. Beta thalassemia affects the beta part of the hemoglobin. This is caused by two genes. You could get one from each parent. Thalassemia is a lifelong condition. There is no cure, but treatment can control symptoms and manage the condition. What are the causes? Thalassemia is caused by gene mutations that are passed down through families. This condition ranges from mild to severe. People born with more gene mutations have more severe forms of the condition. A person who inherits just one gene will be a carrier of the condition (thalassemia trait). A person with thalassemia trait may not have any symptoms or may have only mild anemia. A person who inherits two or more genes can have thalassemia minor, thalassemia intermedia, or thalassemia major. What increases the risk? You are more likely to develop this condition if you have a family history of thalassemia. Also: You are at higher risk for alpha thalassemia if your ancestors are from [place], [place], the [place], or the [place] region. You are at higher risk for beta thalassemia if your ancestors are from [place], [place], [place], or the [place] region. What are the signs or symptoms? The most  common signs and symptoms of thalassemia are the signs and symptoms of anemia. They include: Weakness or tiredness. Pounding heartbeat. Dizziness or headache. Leg cramps. Pale skin. Feeling confused. Shortness of breath. Other symptoms may include: Skin or the white parts of your eyes turn yellow (jaundice), and dark urine. The breakdown of red blood cells can cause a yellowing pigment (bilirubin) to build up in your blood. Weak bones (osteoporosis) and bone breaks. Bones can weaken from the effort of making more hemoglobin. An enlarged spleen. This can lead to a swollen belly. Your spleen can become enlarged from filtering dead red blood cells. Frequent, severe infections. These occur if your spleen and bone marrow become weak. These organs make white blood cells that your body needs to fight infections. How is this diagnosed? Your health care provider may suspect thalassemia based on your signs and symptoms, especially if you have a family history of the condition. This condition may be diagnosed: In childhood, if you have a severe form of thalassemia. With severe forms, symptoms show early in life. At birth. In the [place] babies are screened for this condition. In adulthood, if you have thalassemia trait or thalassemia minor. These may be diagnosed if symptoms of anemia start or if a routine blood test shows unexplained anemia. Blood tests can confirm a thalassemia diagnosis. Blood tests may show: Low hemoglobin levels. Low iron levels. Abnormal hemoglobin levels. Thalassemia gene mutations. You may need to see a health care provider who specializes in blood diseases (hematologist). How is this treated? Treatment for this condition depends on the type of thalassemia that  you have. If you have thalassemia trait or thalassemia minor, you may not need treatment. But you may need treatment if you have thalassemia minor and you develop symptoms of it during an infection. If you have  thalassemia intermedia, you will have symptoms that require treatment. If you have thalassemia major, you will have serious symptoms that require regular treatment. Thalassemia treatment may include: Donated blood (transfusions) to replace red blood cells. Vitamin B (folic acid) supplements to help produce hemoglobin and red blood cells. Calcium and vitamin D supplements to help prevent osteoporosis. Medicines or injections to remove iron buildup (chelation). Iron buildup can happen in people who have transfusions often. Iron overload can damage cells of the heart, liver, and brain. If you have severe thalassemia: Your spleen may need to be removed if it becomes damaged. You may need stem cell or bone marrow transplants to transplant cells that can make red blood cells. These may be done if transfusions are not working. Follow these instructions at home: Eating and drinking  Follow instructions from your health care provider about eating or drinking restrictions. You may need to avoid foods or drinks that are high in iron or that have iron added to them (are fortified). Eat foods that are high in fiber, such as beans, whole grains, and fresh fruits and vegetables. Limit foods that are high in fat and processed sugars, such as fried or sweet foods. Good nutrition can help prevent anemia. Activity Return to your normal activities as told by your health care provider. Ask your health care provider what activities are safe for you. Exercise is needed for maintaining energy and strong bones. Ask your health care provider what amount and type of exercise is safe for you. General instructions  Take over-the-counter and prescription medicines only as told by your health care provider. Keep all routine vaccinations and flu shots up to date to reduce your risk of infection. Wash your hands often with soap and water for at least 20 seconds. If soap and water are not available, use hand sanitizer. Try to  avoid sick people, and stay out of crowds during cold and flu seasons. Do not use any products that contain nicotine or tobacco. These products include cigarettes, chewing tobacco, and vaping devices, such as e-cigarettes. If you need help quitting, ask your health care provider. Do not drink alcohol. Meet with a Dietitian if you are or may become pregnant. A genetic counselor can explain the risks of passing thalassemia to a child. Keep all follow-up visits. This is important. Where to find support [employer]: thalassemia.org Where to find more information Thal[employer]emia [employer]: ProposalRequests.ca Contact a health care provider if you have: Signs or symptoms of anemia. A fever or other signs of infection. A swollen belly. Jaundice. Summary Thalassemia is a blood disorder that causes anemia. This condition is passed down through families. Thalassemia can range from mild to severe. There is no cure, but treatment can help manage symptoms and prevent anemia. This information is not intended to replace advice given to you by your health care provider. Make sure you discuss any questions you have with your health care provider. Document Revised: 02/01/2021 Document Reviewed: 02/01/2021 Elsevier Patient Education  [place].

## 2021-06-23 LAB — CMP14+EGFR
ALT: 28 IU/L (ref 0–32)
AST: 20 IU/L (ref 0–40)
Albumin/Globulin Ratio: 1.7 (ref 1.2–2.2)
Albumin: 4.3 g/dL (ref 3.9–5.0)
Alkaline Phosphatase: 52 IU/L (ref 44–121)
BUN/Creatinine Ratio: 13 (ref 9–23)
BUN: 9 mg/dL (ref 6–20)
Bilirubin Total: 0.3 mg/dL (ref 0.0–1.2)
CO2: 26 mmol/L (ref 20–29)
Calcium: 9.1 mg/dL (ref 8.7–10.2)
Chloride: 103 mmol/L (ref 96–106)
Creatinine, Ser: 0.69 mg/dL (ref 0.57–1.00)
Globulin, Total: 2.6 g/dL (ref 1.5–4.5)
Glucose: 105 mg/dL — ABNORMAL HIGH (ref 70–99)
Potassium: 4.2 mmol/L (ref 3.5–5.2)
Sodium: 142 mmol/L (ref 134–144)
Total Protein: 6.9 g/dL (ref 6.0–8.5)
eGFR: 123 mL/min/{1.73_m2} (ref >59)

## 2021-06-23 LAB — LIPID PANEL
Chol/HDL Ratio: 4.7 ratio — ABNORMAL HIGH (ref 0.0–4.4)
Cholesterol, Total: 225 mg/dL — ABNORMAL HIGH (ref 100–199)
HDL: 48 mg/dL (ref >39)
LDL Chol Calc (NIH): 130 mg/dL — ABNORMAL HIGH (ref 0–99)
Triglycerides: 262 mg/dL — ABNORMAL HIGH (ref 0–149)
VLDL Cholesterol Cal: 47 mg/dL — ABNORMAL HIGH (ref 5–40)

## 2021-06-23 LAB — IRON,TIBC AND FERRITIN PANEL
Ferritin: 20 ng/mL (ref 15–150)
Iron Saturation: 12 % — ABNORMAL LOW (ref 15–55)
Iron: 45 ug/dL (ref 27–159)
Total Iron Binding Capacity: 376 ug/dL (ref 250–450)
UIBC: 331 ug/dL (ref 131–425)

## 2021-06-26 NOTE — Progress Notes (Signed)
  [facility]     HPI [name]25 y.o.female presents for type 2 diabetes -CHO counting diet discussed.  Reviewed CHO amount in various foods and how to read nutrition labels.  Discussed recommended serving sizes. DM has improves vastly.   Past Medical History:  Diagnosis Date   Anxiety    Asthma    COVID-19 08/16/2020   DM (diabetes mellitus), type 2 ([place])    Fibromyalgia    Genital herpes    History of marijuana use 2021   Hypercholesteremia    Hypertension    Nexplanon insertion 08/26/2013   Lot #[id number] Exp 02/2016    PCOS (polycystic ovarian syndrome)      No Known Allergies    Current Outpatient Medications on File Prior to Visit  Medication Sig Dispense Refill   albuterol (VENTOLIN HFA) 108 (90 Base) MCG/ACT inhaler Inhale 2 puffs into the lungs every 6 (six) hours as needed for wheezing or shortness of breath. (Patient not taking: No sig reported) 8.5 g 1   busPIRone (BUSPAR) 5 MG tablet Take 1 tablet (5 mg total) by mouth 2 (two) times daily. 60 tablet 1   Dulaglutide (TRULICITY) 1.61 WR/6.0AV SOPN Inject 0.75 mg into the skin once a week. 2 mL 5   omeprazole (PRILOSEC) 20 MG capsule TAKE 1 CAPSULE (20 MG TOTAL) BY MOUTH IN THE MORNING AND AT BEDTIME. (Patient not taking: No sig reported) 30 capsule 3   sertraline (ZOLOFT) 100 MG tablet Take 1 tablet (100 mg total) by mouth daily. 90 tablet 3   valACYclovir (VALTREX) 500 MG tablet Take 1 tablet (500 mg total) by mouth daily. (Patient not taking: No sig reported) 30 tablet 12   [DISCONTINUED] insulin aspart (NOVOLOG) 100 UNIT/ML injection Inject 8 Units into the skin 3 (three) times daily before meals. (Patient not taking: Reported on 09/17/2020) 10 mL 12   No current facility-administered medications on file prior to visit.    ROS: all negative except above.   Physical Exam: There were no vitals filed for this visit. There were no vitals taken for this visit. General Appearance: Well  nourished, in no apparent distress. Eyes: PERRLA, EOMs, conjunctiva no swelling or erythema Sinuses: No Frontal/maxillary tenderness ENT/Mouth: Ext aud canals clear, TMs without erythema, bulging. No erythema, swelling, or exudate on post pharynx.  Tonsils not swollen or erythematous. Hearing normal.  Neck: Supple, thyroid normal.  Respiratory: Respiratory effort normal, BS equal bilaterally without rales, rhonchi, wheezing or stridor.  Cardio: RRR with no MRGs. Brisk peripheral pulses without edema.  Abdomen: Soft, + BS.  Non tender, no guarding, rebound, hernias, masses. Lymphatics: Non tender without lymphadenopathy.  Musculoskeletal: Full ROM, 5/5 strength, normal gait.  Skin: Warm, dry without rashes, lesions, ecchymosis.  Neuro: Cranial nerves intact. Normal muscle tone, no cerebellar symptoms. Sensation intact.  Psych: Awake and oriented X 3, normal affect, Insight and Judgment appropriate.    Diagnoses and all orders for this visit:  Alpha thalassemia silent carrier -     Iron, TIBC and Ferritin Panel  Type 2 diabetes mellitus without complication, without long-term current use of insulin (HCC) Monitor foods that are high in carbohydrates are the following rice, potatoes, breads, sugars, and pastas.  Reduction in the intake (eating) will assist in lowering your blood sugars.  -     CMP14+EGFR -     Lipid Panel    Kerin Perna, NP 11:39 PM \

## 2021-06-27 ENCOUNTER — Other Ambulatory Visit: Payer: Self-pay

## 2021-06-27 ENCOUNTER — Other Ambulatory Visit (INDEPENDENT_AMBULATORY_CARE_PROVIDER_SITE_OTHER): Payer: Self-pay | Admitting: Primary Care

## 2021-06-27 DIAGNOSIS — E782 Mixed hyperlipidemia: Secondary | ICD-10-CM

## 2021-06-27 MED ORDER — ATORVASTATIN CALCIUM 40 MG PO TABS
40.0000 mg | ORAL_TABLET | Freq: Every day | ORAL | 1 refills | Status: DC
  Filled 2021-06-27: qty 90, 90d supply, fill #0

## 2021-07-04 ENCOUNTER — Other Ambulatory Visit: Payer: Self-pay

## 2021-07-11 ENCOUNTER — Ambulatory Visit (INDEPENDENT_AMBULATORY_CARE_PROVIDER_SITE_OTHER): Payer: Medicaid Other | Admitting: Clinical

## 2021-07-11 ENCOUNTER — Other Ambulatory Visit: Payer: Self-pay

## 2021-07-11 DIAGNOSIS — F331 Major depressive disorder, recurrent, moderate: Secondary | ICD-10-CM

## 2021-07-11 DIAGNOSIS — F1220 Cannabis dependence, uncomplicated: Secondary | ICD-10-CM

## 2021-07-15 ENCOUNTER — Ambulatory Visit: Payer: Medicaid Other
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
## 2021-07-28 ENCOUNTER — Encounter (HOSPITAL_COMMUNITY): Payer: Self-pay

## 2021-08-17 ENCOUNTER — Other Ambulatory Visit: Payer: Self-pay

## 2021-08-17 ENCOUNTER — Ambulatory Visit (INDEPENDENT_AMBULATORY_CARE_PROVIDER_SITE_OTHER): Payer: Medicaid Other

## 2021-08-17 VITALS — BP 135/86 | HR 83 | Temp 97.9°F | Ht 68.0 in | Wt 181.0 lb

## 2021-08-17 DIAGNOSIS — Z3009 Encounter for other general counseling and advice on contraception: Secondary | ICD-10-CM

## 2021-08-17 NOTE — Progress Notes (Signed)
° °  GYNECOLOGY PROGRESS NOTE  History:  26 y.o. G1P1001 presents to [facility] office today for contraceptive counseling. She desires IUD. LMP 08/03/2021. Last had unprotected intercourse on 08/14/2021.   The following portions of the patient's history were reviewed and updated as appropriate: allergies, current medications, past family history, past medical history, past social history, past surgical history and problem list. Last pap smear on 10/2019 was normal.  Health Maintenance Due  Topic Date Due   COVID-19 Vaccine (1) Never done   Pneumococcal Vaccine 55-51 Years old (2 - PCV) 01/09/2015   FOOT EXAM  09/07/2020   OPHTHALMOLOGY EXAM  09/11/2020     Review of Systems:  Pertinent items are noted in HPI.   Objective:  Physical Exam Blood pressure 135/86, pulse 83, temperature 97.9 F (36.6 C), height 5\' 8"  (1.727 m), weight 181 lb (82.1 kg), last menstrual period 08/03/2021, not currently breastfeeding. VS reviewed, nursing note reviewed,  Constitutional: well developed, well nourished, no distress HEENT: normocephalic CV: normal rate Pulm/chest wall: normal effort Breast Exam: deferred Abdomen: soft Neuro: alert and oriented x 3 Skin: warm, dry Psych: affect normal Pelvic exam: not indicated  Assessment & Plan:  1. Birth control counseling - Desires IUD - Unprotected intercourse on 08/14/2021 - Instructed to refrain from unprotected intercourse for 2 weeks prior to IUD insertion - Return in 3-4 weeks for IUD insert    Renee Harder, [place] 08/17/21 4:33 PM

## 2021-08-30 ENCOUNTER — Ambulatory Visit (HOSPITAL_COMMUNITY): Payer: Medicaid Other | Admitting: Clinical

## 2021-09-09 ENCOUNTER — Other Ambulatory Visit: Payer: Self-pay

## 2021-09-09 ENCOUNTER — Ambulatory Visit (INDEPENDENT_AMBULATORY_CARE_PROVIDER_SITE_OTHER): Payer: Medicaid Other | Admitting: Family

## 2021-09-09 DIAGNOSIS — F1220 Cannabis dependence, uncomplicated: Secondary | ICD-10-CM

## 2021-09-09 DIAGNOSIS — F331 Major depressive disorder, recurrent, moderate: Secondary | ICD-10-CM | POA: Diagnosis not present

## 2021-09-09 MED ORDER — BUSPIRONE HCL 15 MG PO TABS
15.0000 mg | ORAL_TABLET | Freq: Two times a day (BID) | ORAL | 0 refills | Status: AC
  Filled 2021-09-09 – 2021-10-24 (×6): qty 60, 30d supply, fill #0

## 2021-09-09 NOTE — Progress Notes (Deleted)
Virtual Visit via Telephone Note  I connected with Tamara Reyes on 09/09/21 at  8:50 AM EST by telephone and verified that I am speaking with the correct person using two identifiers.   [facility] Discharge Summary  Charles Niese [id number]  Admission date: *** Discharge date: ***  Reason for admission: ***  Chemical Use History: ***  Family of Origin Issues: ***  Progress in Program Toward Treatment Goals: ***  Progress (rationale): ***    Malachy Mood, PA 09/09/2021
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
## 2021-09-13 ENCOUNTER — Encounter (HOSPITAL_COMMUNITY): Payer: Self-pay | Admitting: Family

## 2021-09-13 ENCOUNTER — Other Ambulatory Visit: Payer: Self-pay

## 2021-09-13 ENCOUNTER — Ambulatory Visit (INDEPENDENT_AMBULATORY_CARE_PROVIDER_SITE_OTHER): Payer: Medicaid Other | Admitting: Clinical

## 2021-09-13 DIAGNOSIS — F331 Major depressive disorder, recurrent, moderate: Secondary | ICD-10-CM

## 2021-09-14 NOTE — Progress Notes (Signed)
° °  THERAPIST PROGRESS NOTE  Session Time: 40 minutes  Participation Level: Active  Behavioral Response: CasualAlertEuthymic  Type of Therapy: Individual Therapy  Treatment Goals addressed: Coping  Interventions: CBT and Supportive  Summary:  Myiah Petkus is a 26 y.o. female who presents for the scheduled session oriented times five, appropriately dressed, and friendly. Client denied hallucinations and delusions. Client reported on today she has been doing about the same since last seen. Client reported she started taking Buspar on today and will see how that medication works for her. Client reported she does not use marijuana as much as she use to and finding more positive outlets to help with her anxiety. Client reported her boyfriend has been very supportive with giving her motivational and encouraging words to keep her going because some days she does not want to go to work. Client reported people are accustomed to her being engaged with conversation but has days when she does not want to engage with others at work as much. Client reported her employer is understanding. Client reported feeling drained from the social interaction after work. Client reflected on her family dynamic and understanding her relationship with her mother negatively affected her growing up and currently. Client recalled a time when she would argue back and cuss at her mom for things she'd say. Client reported she no longer does that and will end a conversation. Client reported she tries to keep herself well rounded by not spending so much time in the house.    Suicidal/Homicidal: Nowithout intent/plan  Therapist Response:  Therapist began the session asking the client how she has been doing since she was last seen. Therapist used CBT to engage using active listening and positive emotional support. Therapist used CBT to engage and ask the client to identify childhood experiences that have contributed to her current  emotions and actions. Therapist used CBT to engage and ask the client changes she has made for herself to help improve her reaction to triggers. Therapist assigned the client homework to practice applying boundaries and appropriate communication skills to improve her impulse control. Client was scheduled for next appointment.     Plan: Return again in 5 weeks.  Diagnosis: Major depressive disorder, recurrent episode, moderate with anxious distress   Birdena Jubilee Ivylynn Hoppes, LCSW 09/14/2021

## 2021-09-15 ENCOUNTER — Other Ambulatory Visit: Payer: Self-pay

## 2021-09-27 ENCOUNTER — Encounter (HOSPITAL_COMMUNITY): Payer: Self-pay

## 2021-09-27 ENCOUNTER — Ambulatory Visit (HOSPITAL_COMMUNITY): Payer: Medicaid Other | Admitting: Clinical

## 2021-09-28 ENCOUNTER — Telehealth (HOSPITAL_COMMUNITY): Payer: Self-pay | Admitting: Clinical

## 2021-09-28 NOTE — Telephone Encounter (Signed)
Therapist made contact with the client on 09/27/2021 ather appointment time. Therapist was able to make video contact with the client. Client reported she has been maintaining fairly well. Client was disconnected from the video appointment. Therapist attempted to call the client back via tele-phone. Clients phone went to voicemail. Therapist left a voicemail for her to call back to reschedule.

## 2021-10-05 ENCOUNTER — Other Ambulatory Visit (HOSPITAL_BASED_OUTPATIENT_CLINIC_OR_DEPARTMENT_OTHER): Payer: Self-pay

## 2021-10-06 ENCOUNTER — Other Ambulatory Visit: Payer: Self-pay

## 2021-10-06 ENCOUNTER — Other Ambulatory Visit (HOSPITAL_BASED_OUTPATIENT_CLINIC_OR_DEPARTMENT_OTHER): Payer: Self-pay

## 2021-10-11 ENCOUNTER — Other Ambulatory Visit (HOSPITAL_COMMUNITY): Payer: Self-pay

## 2021-10-13 ENCOUNTER — Other Ambulatory Visit: Payer: Self-pay

## 2021-10-18 ENCOUNTER — Other Ambulatory Visit (HOSPITAL_COMMUNITY): Payer: Self-pay

## 2021-10-24 ENCOUNTER — Other Ambulatory Visit (HOSPITAL_BASED_OUTPATIENT_CLINIC_OR_DEPARTMENT_OTHER): Payer: Self-pay

## 2021-10-24 ENCOUNTER — Other Ambulatory Visit: Payer: Self-pay

## 2021-10-26 ENCOUNTER — Encounter (HOSPITAL_COMMUNITY): Payer: Self-pay

## 2021-10-26 ENCOUNTER — Telehealth (HOSPITAL_COMMUNITY): Payer: Medicaid Other | Admitting: Physician Assistant

## 2021-10-31 ENCOUNTER — Other Ambulatory Visit: Payer: Self-pay

## 2021-10-31 ENCOUNTER — Telehealth (HOSPITAL_COMMUNITY): Payer: Medicaid Other | Admitting: Physician Assistant

## 2022-04-06 DIAGNOSIS — F331 Major depressive disorder, recurrent, moderate: Secondary | ICD-10-CM | POA: Diagnosis not present

## 2022-04-06 DIAGNOSIS — I10 Essential (primary) hypertension: Secondary | ICD-10-CM | POA: Diagnosis not present

## 2022-04-06 DIAGNOSIS — F411 Generalized anxiety disorder: Secondary | ICD-10-CM | POA: Diagnosis not present

## 2022-04-06 DIAGNOSIS — E119 Type 2 diabetes mellitus without complications: Secondary | ICD-10-CM | POA: Diagnosis not present

## 2022-05-08 DIAGNOSIS — Z713 Dietary counseling and surveillance: Secondary | ICD-10-CM | POA: Diagnosis not present

## 2022-05-08 DIAGNOSIS — F4321 Adjustment disorder with depressed mood: Secondary | ICD-10-CM | POA: Diagnosis not present

## 2022-05-08 DIAGNOSIS — E0590 Thyrotoxicosis, unspecified without thyrotoxic crisis or storm: Secondary | ICD-10-CM | POA: Diagnosis not present

## 2022-05-08 DIAGNOSIS — F411 Generalized anxiety disorder: Secondary | ICD-10-CM | POA: Diagnosis not present

## 2022-05-08 DIAGNOSIS — Z8639 Personal history of other endocrine, nutritional and metabolic disease: Secondary | ICD-10-CM | POA: Diagnosis not present

## 2022-05-08 DIAGNOSIS — E119 Type 2 diabetes mellitus without complications: Secondary | ICD-10-CM | POA: Diagnosis not present

## 2022-05-08 DIAGNOSIS — Z5941 Food insecurity: Secondary | ICD-10-CM | POA: Diagnosis not present

## 2022-05-08 DIAGNOSIS — F331 Major depressive disorder, recurrent, moderate: Secondary | ICD-10-CM | POA: Diagnosis not present

## 2022-05-08 DIAGNOSIS — F321 Major depressive disorder, single episode, moderate: Secondary | ICD-10-CM | POA: Diagnosis not present

## 2022-05-08 DIAGNOSIS — Z7182 Exercise counseling: Secondary | ICD-10-CM | POA: Diagnosis not present

## 2022-05-08 DIAGNOSIS — I10 Essential (primary) hypertension: Secondary | ICD-10-CM | POA: Diagnosis not present

## 2022-05-08 DIAGNOSIS — Z13220 Encounter for screening for lipoid disorders: Secondary | ICD-10-CM | POA: Diagnosis not present

## 2022-05-08 DIAGNOSIS — Z0000 Encounter for general adult medical examination without abnormal findings: Secondary | ICD-10-CM | POA: Diagnosis not present

## 2022-05-08 DIAGNOSIS — Z862 Personal history of diseases of the blood and blood-forming organs and certain disorders involving the immune mechanism: Secondary | ICD-10-CM | POA: Diagnosis not present

## 2022-05-11 DIAGNOSIS — D649 Anemia, unspecified: Secondary | ICD-10-CM | POA: Insufficient documentation

## 2022-05-22 DIAGNOSIS — F4321 Adjustment disorder with depressed mood: Secondary | ICD-10-CM | POA: Diagnosis not present

## 2022-05-22 DIAGNOSIS — F321 Major depressive disorder, single episode, moderate: Secondary | ICD-10-CM | POA: Diagnosis not present

## 2022-06-13 ENCOUNTER — Ambulatory Visit (INDEPENDENT_AMBULATORY_CARE_PROVIDER_SITE_OTHER): Payer: Medicaid Other | Admitting: Obstetrics

## 2022-06-13 ENCOUNTER — Encounter: Payer: Self-pay | Admitting: Obstetrics

## 2022-06-13 ENCOUNTER — Other Ambulatory Visit (HOSPITAL_COMMUNITY)
Admission: RE | Admit: 2022-06-13 | Discharge: 2022-06-13 | Disposition: A | Discharge status: Home / Self Care | Payer: Medicaid Other | Source: Ambulatory Visit | Attending: Obstetrics | Admitting: Obstetrics

## 2022-06-13 VITALS — BP 124/81 | HR 101 | Ht 67.0 in | Wt 175.0 lb

## 2022-06-13 DIAGNOSIS — Z01419 Encounter for gynecological examination (general) (routine) without abnormal findings: Secondary | ICD-10-CM | POA: Insufficient documentation

## 2022-06-13 DIAGNOSIS — N898 Other specified noninflammatory disorders of vagina: Secondary | ICD-10-CM

## 2022-06-13 DIAGNOSIS — I10 Essential (primary) hypertension: Secondary | ICD-10-CM | POA: Diagnosis not present

## 2022-06-13 DIAGNOSIS — Z3009 Encounter for other general counseling and advice on contraception: Secondary | ICD-10-CM

## 2022-06-13 NOTE — Progress Notes (Unsigned)
Subjective:        Tamara Reyes is a 26 y.o. female here for a routine exam.  Current complaints: positive for vaginal discharge.    Personal health questionnaire:  Is patient [name], have a family history of breast and/or ovarian cancer: no Is there a family history of uterine cancer diagnosed at age < 87, gastrointestinal cancer, urinary tract cancer, family member who is a Field seismologist syndrome-associated carrier: no Is the patient overweight and hypertensive, family history of diabetes, personal history of gestational diabetes, preeclampsia or PCOS: yes Is patient over 8, have PCOS,  family history of premature CHD under age 39, diabetes, smoke, have hypertension or peripheral artery disease:  no At any time, has a partner hit, kicked or otherwise hurt or frightened you?: no Over the past 2 weeks, have you felt down, depressed or hopeless?: no Over the past 2 weeks, have you felt little interest or pleasure in doing things?:no   Gynecologic History Patient's last menstrual period was 04/10/2022. Contraception: none Last Pap: 2021. Results were: normal Last mammogram: n/a. Results were: n/a  Obstetric History OB History  Gravida Para Term Preterm AB Living  '1 1 1 '$ 0 0 1  SAB IAB Ectopic Multiple Live Births  0 0 0 0 1    # Outcome Date GA Lbr Len/2nd Weight Sex Delivery Anes PTL Lv  1 Term 03/29/21 [id number] 6 lb 3.5 oz (2.821 kg) M Vag-Spont EPI  LIV    Past Medical History:  Diagnosis Date   Anxiety    Asthma    COVID-19 08/16/2020   DM (diabetes mellitus), type 2 ([place]    Fibromyalgia    Genital herpes    History of marijuana use 2021   Hypercholesteremia    Hypertension    Nexplanon insertion 08/26/2013   Lot #[id number] Exp 02/2016    PCOS (polycystic ovarian syndrome)     Past Surgical History:  Procedure Laterality Date   abscess removal      TONSILLECTOMY     TONSILLECTOMY AND ADENOIDECTOMY     tonsils only     Current Outpatient  Medications:    albuterol (VENTOLIN HFA) 108 (90 Base) MCG/ACT inhaler, Inhale 2 puffs into the lungs every 6 (six) hours as needed for wheezing or shortness of breath., Disp: 8.5 g, Rfl: 1   escitalopram (LEXAPRO) 20 MG tablet, Take by mouth., Disp: , Rfl:    metFORMIN (GLUCOPHAGE) 500 MG tablet, Take 500 mg by mouth 2 (two) times daily., Disp: , Rfl:    atorvastatin (LIPITOR) 40 MG tablet, Take 1 tablet (40 mg total) by mouth daily. (Patient not taking: Reported on 06/13/2022), Disp: 90 tablet, Rfl: 1   busPIRone (BUSPAR) 15 MG tablet, Take 1 tablet (15 mg total) by mouth 2 (two) times daily. (Patient not taking: Reported on 06/13/2022), Disp: 60 tablet, Rfl: 0   Dulaglutide (TRULICITY) 05.85MID/7.8EUSOPN, Inject 0.75 mg into the skin once a week. (Patient not taking: Reported on 06/13/2022), Disp: 2 mL, Rfl: 5   omeprazole (PRILOSEC) 20 MG capsule, TAKE 1 CAPSULE (20 MG TOTAL) BY MOUTH IN THE MORNING AND AT BEDTIME. (Patient not taking: No sig reported), Disp: 30 capsule, Rfl: 3   sertraline (ZOLOFT) 100 MG tablet, Take 1 tablet (100 mg total) by mouth daily. (Patient not taking: Reported on 06/13/2022), Disp: 90 tablet, Rfl: 3   valACYclovir (VALTREX) 500 MG tablet, Take 1 tablet (500 mg total) by mouth daily. (Patient not taking: No sig reported), Disp: 30  tablet, Rfl: 12 No Known Allergies  Social History   Tobacco Use   Smoking status: Former    Types: Cigarettes    Quit date: 05/28/2014    Years since quitting: 8.0   Smokeless tobacco: Never  Substance Use Topics   Alcohol use: Yes    Comment: sometimes    Family History  Problem Relation Age of Onset   Hypertension Mother    Diabetes Mother    Hypertension Maternal Grandmother    Hypertension Maternal Grandfather    Hypertension Paternal Grandmother    Diabetes Paternal Grandmother       Review of Systems  Constitutional: negative for fatigue and weight loss Respiratory: negative for cough and wheezing Cardiovascular:  negative for chest pain, fatigue and palpitations Gastrointestinal: negative for abdominal pain and change in bowel habits Musculoskeletal:negative for myalgias Neurological: negative for gait problems and tremors Behavioral/Psych: negative for abusive relationship, depression Endocrine: negative for temperature intolerance    Genitourinary:positive for vaginal discharge.  negative for abnormal menstrual periods, genital lesions, hot flashes, sexual problems  Integument/breast: negative for breast lump, breast tenderness, nipple discharge and skin lesion(s)    Objective:       BP 124/81   Pulse (!) 101   Ht '5\' 7"'$  (1.702 m)   Wt 175 lb (79.4 kg)   LMP 04/10/2022   Breastfeeding No   BMI 27.41 kg/m  General:   Alert and no distress  Skin:   no rash or abnormalities  Lungs:   clear to auscultation bilaterally  Heart:   regular rate and rhythm, S1, S2 normal, no murmur, click, rub or gallop  Breasts:   normal without suspicious masses, skin or nipple changes or axillary nodes  Abdomen:  normal findings: no organomegaly, soft, non-tender and no hernia  Pelvis:  External genitalia: normal general appearance Urinary system: urethral meatus normal and bladder without fullness, nontender Vaginal: normal without tenderness, induration or masses Cervix: normal appearance Adnexa: normal bimanual exam Uterus: anteverted and non-tender, normal size   Lab Review Urine pregnancy test Labs reviewed yes Radiologic studies reviewed no  I have spent a total of 20 minutes of face-to-face time, excluding clinical staff time, reviewing notes and preparing to see patient, ordering tests and/or medications, and counseling the patient.   Assessment:    .1. Encounter for routine gynecological examination with Papanicolaou smear of cervix Rx: - Cytology - PAP( [name])  2. Vaginal discharge Rx: - Cervicovaginal ancillary only( [name])  3. Encounter for other general counseling and  advice on contraception - considering options Mirena IUD vs Patch  4. Chronic hypertension - clinically stable     Plan:    Education reviewed: calcium supplements, depression evaluation, low fat, low cholesterol diet, safe sex/STD prevention, self breast exams, and weight bearing exercise. Contraception: considering optioons. Follow up in: {1-10:13787} {time; units:10146}.   No orders of the defined types were placed in this encounter.  No orders of the defined types were placed in this encounter.     Shelly Bombard, MD 06/13/2022 2:46 PM

## 2022-06-13 NOTE — Patient Instructions (Addendum)
Vs Patch vs

## 2022-06-13 NOTE — Progress Notes (Signed)
26 y.o GYN presents for AEX/PAP/STD screening.  Wants to talk about [facility] options she currently uses condoms.  Pt is requesting referral to Psychiatry.

## 2022-06-14 ENCOUNTER — Other Ambulatory Visit: Payer: Self-pay | Admitting: Obstetrics

## 2022-06-14 DIAGNOSIS — F321 Major depressive disorder, single episode, moderate: Secondary | ICD-10-CM | POA: Diagnosis not present

## 2022-06-14 DIAGNOSIS — F4321 Adjustment disorder with depressed mood: Secondary | ICD-10-CM | POA: Diagnosis not present

## 2022-06-14 DIAGNOSIS — A549 Gonococcal infection, unspecified: Secondary | ICD-10-CM

## 2022-06-14 DIAGNOSIS — B9689 Other specified bacterial agents as the cause of diseases classified elsewhere: Secondary | ICD-10-CM

## 2022-06-14 LAB — CERVICOVAGINAL ANCILLARY ONLY
Bacterial Vaginitis (gardnerella): POSITIVE — AB
Candida Glabrata: NEGATIVE
Candida Vaginitis: NEGATIVE
Chlamydia: NEGATIVE
Comment: NEGATIVE
Comment: NEGATIVE
Comment: NEGATIVE
Comment: NEGATIVE
Comment: NEGATIVE
Comment: NORMAL
Neisseria Gonorrhea: POSITIVE — AB
Trichomonas: NEGATIVE

## 2022-06-14 MED ORDER — CEFTRIAXONE SODIUM 500 MG IJ SOLR
500.0000 mg | Freq: Once | INTRAMUSCULAR | Status: AC
Start: 2022-06-14 — End: 2022-06-16
  Administered 2022-06-16: 500 mg via INTRAMUSCULAR

## 2022-06-14 MED ORDER — METRONIDAZOLE 500 MG PO TABS: 500.0000 mg | ORAL_TABLET | Freq: Two times a day (BID) | ORAL | 2 refills | Status: DC

## 2022-06-15 NOTE — Progress Notes (Signed)
TC to inform pt of gonorrhea and BV.  Notified of need to present to office for IM Rocephin injection to [place]. Notified of need for partner [place] and no unprotected sex X 7 days. Notified of Flagyl RX for BV. Pt asking if she should delay IUD placement until infection is clear. Advised to delay due to risk for infection. Pt verbalized understanding. Pt will come 06/16/22 for IM Rocephin. Call transferred to schedulers.

## 2022-06-16 ENCOUNTER — Ambulatory Visit (INDEPENDENT_AMBULATORY_CARE_PROVIDER_SITE_OTHER): Payer: Medicaid Other | Admitting: *Deleted

## 2022-06-16 VITALS — BP 131/86 | HR 85

## 2022-06-16 DIAGNOSIS — A549 Gonococcal infection, unspecified: Secondary | ICD-10-CM

## 2022-06-16 LAB — CYTOLOGY - PAP
Diagnosis: NEGATIVE
Diagnosis: REACTIVE

## 2022-06-16 NOTE — Progress Notes (Signed)
Pt presents for injection of 500 mg Rocephin for TX of Gonorrhea. Pt is well appearing. Pt verbalized understanding of [place] and of [place] needed for partner. Advised to abstain from sex for 1 week following TX of both parties. Pt verbalized understanding.

## 2022-06-28 DIAGNOSIS — F321 Major depressive disorder, single episode, moderate: Secondary | ICD-10-CM | POA: Diagnosis not present

## 2022-06-28 DIAGNOSIS — F4321 Adjustment disorder with depressed mood: Secondary | ICD-10-CM | POA: Diagnosis not present

## 2022-08-04 ENCOUNTER — Ambulatory Visit: Payer: Medicaid Other

## 2022-08-10 ENCOUNTER — Ambulatory Visit: Payer: Medicaid Other | Admitting: Obstetrics and Gynecology

## 2022-08-10 ENCOUNTER — Other Ambulatory Visit (HOSPITAL_COMMUNITY)
Admission: RE | Admit: 2022-08-10 | Discharge: 2022-08-10 | Disposition: A | Discharge status: Home / Self Care | Payer: Medicaid Other | Source: Ambulatory Visit | Attending: Obstetrics and Gynecology | Admitting: Obstetrics and Gynecology

## 2022-08-10 ENCOUNTER — Ambulatory Visit (INDEPENDENT_AMBULATORY_CARE_PROVIDER_SITE_OTHER): Payer: Medicaid Other

## 2022-08-10 DIAGNOSIS — Z113 Encounter for screening for infections with a predominantly sexual mode of transmission: Secondary | ICD-10-CM | POA: Insufficient documentation

## 2022-08-10 DIAGNOSIS — A549 Gonococcal infection, unspecified: Secondary | ICD-10-CM | POA: Diagnosis not present

## 2022-08-10 NOTE — Progress Notes (Signed)
Patient was assessed and managed by nursing staff during this encounter. I have reviewed the chart and agree with the documentation and plan. I have also made any necessary editorial changes.  Griffin Basil, MD 08/10/2022 3:10 PM

## 2022-08-10 NOTE — Progress Notes (Deleted)
SUBJECTIVE:  26 y.o. female who presents for IUD insertion. After review of the chart and consulting with Dr. Elgie Congo, IUD insertion canceled due to previous infection from 06/16/2022 and self swab ordered.  Denies abnormal vaginal discharge, bleeding or significant pelvic pain. No UTI symptoms. History of known exposure to STD.  No LMP recorded.  OBJECTIVE:  She appears well.   ASSESSMENT:  STI Screen   PLAN:  GC, chlamydia, and trichomonas probe sent to lab.  Treatment: To be determined once lab results are received.  Pt follow up after results are back for IUD insertion.

## 2022-08-10 NOTE — Progress Notes (Signed)
SUBJECTIVE:  26 y.o. female who presents for an IUD insertion. After review of the chart and consulting with Dr. Elgie Congo, IUD insertion cancelled due to infection from 06/16/2022 and self swab ordered. Denies abnormal vaginal discharge, bleeding or significant pelvic pain. No UTI symptoms. History of known exposure to STD.  No LMP recorded.  OBJECTIVE:  She appears well.   ASSESSMENT:  STI Screen   PLAN:  GC, chlamydia, and trichomonas probe sent to lab.  Treatment: To be determined once lab results are received.  Pt follow up for IUD insertion after swab results.

## 2022-08-10 NOTE — Progress Notes (Signed)
Patient was assessed and managed by nursing staff during this encounter. I have reviewed the chart and agree with the documentation and plan. I have also made any necessary editorial changes.  Griffin Basil, MD 08/10/2022 3:09 PM

## 2022-08-11 LAB — CERVICOVAGINAL ANCILLARY ONLY
Chlamydia: NEGATIVE
Comment: NEGATIVE
Comment: NEGATIVE
Comment: NORMAL
Neisseria Gonorrhea: NEGATIVE
Trichomonas: NEGATIVE

## 2022-08-11 NOTE — Progress Notes (Signed)
Visit was canceled.

## 2022-08-13 IMAGING — US US MFM OB FOLLOW-UP
1 series · 14 of 28 positions shown · non-contrast
Comparison: none

[Series 1: us mfm ob follow-up · 37 acquisitions, 14 frames shown]
[im 2/37]
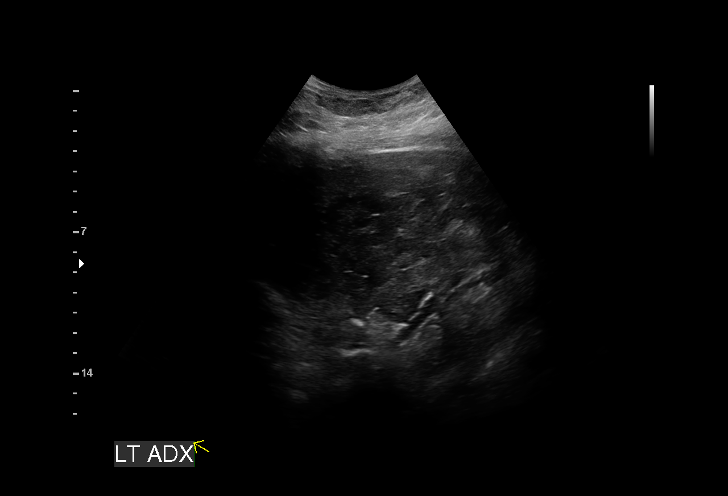
[im 5/37]
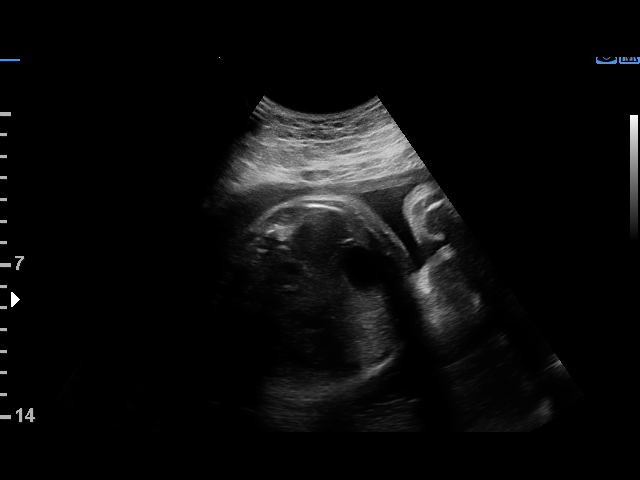
[im 7/37]
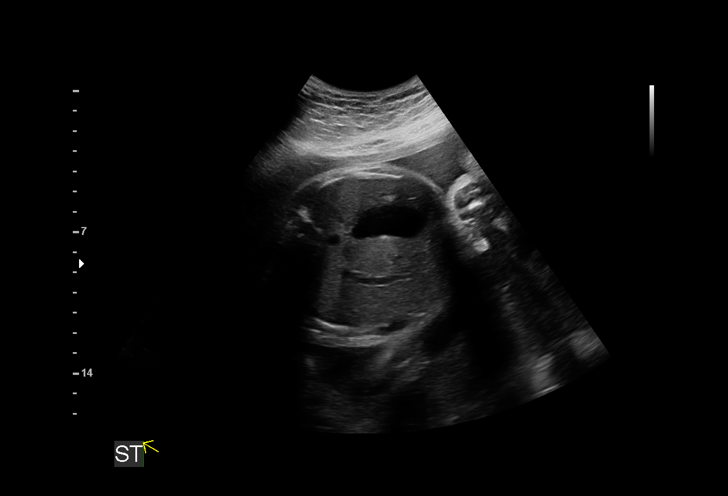
[im 10/37]
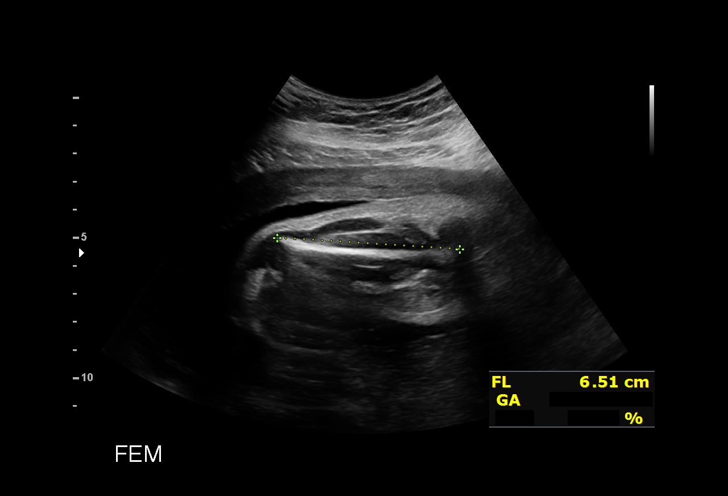
[im 13/37]
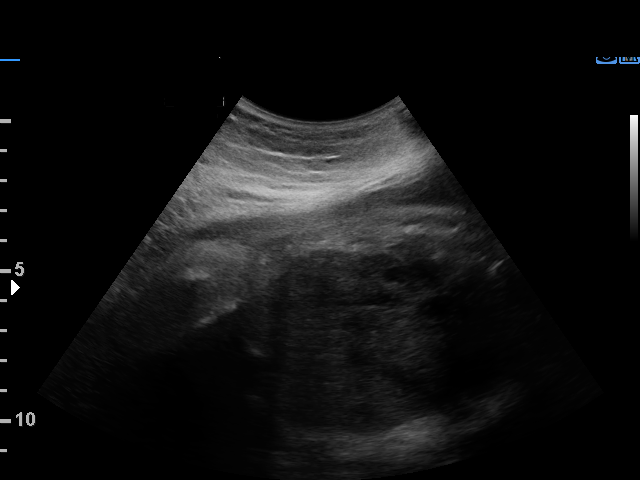
[im 15/37]
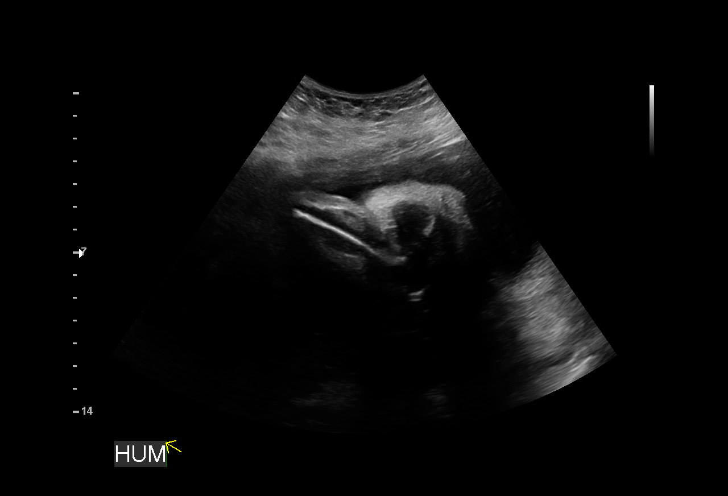
[im 18/37]
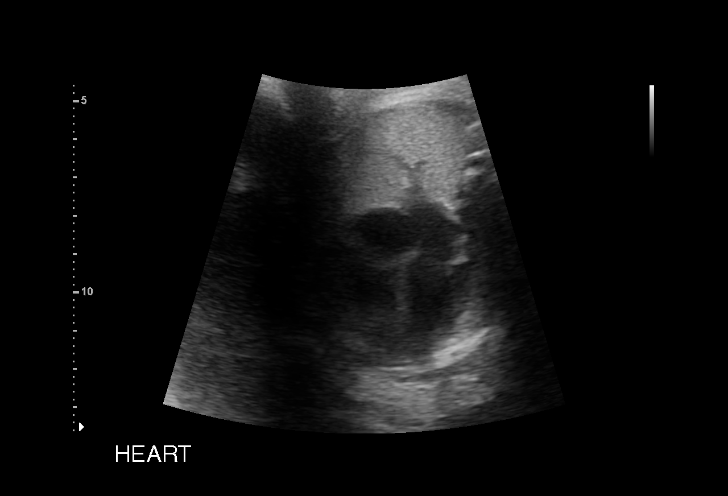
[im 21/37]
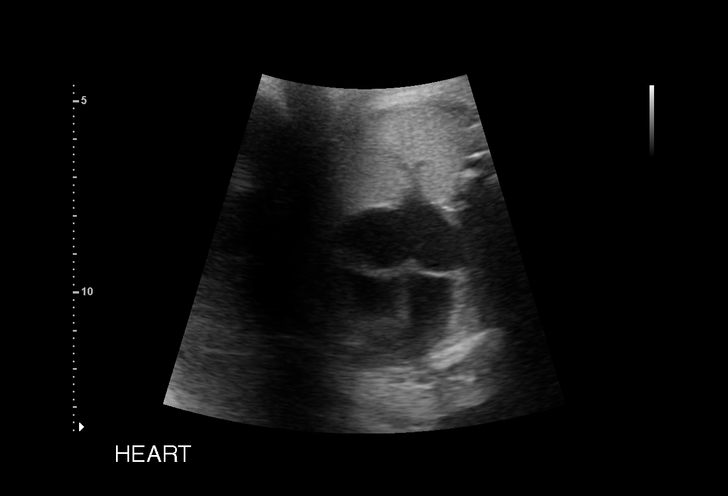
[im 23/37]
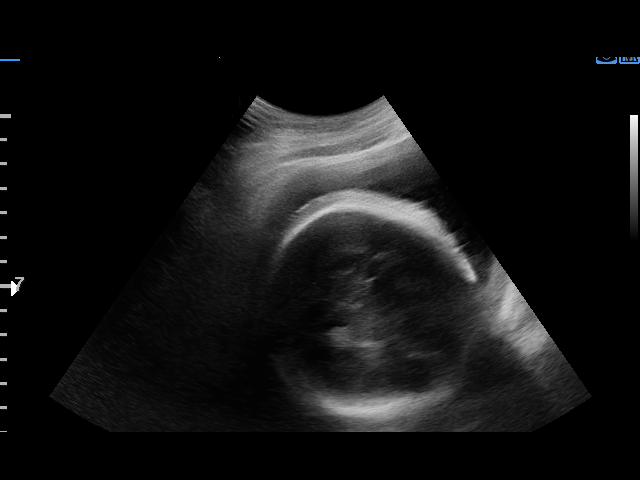
[im 26/37]
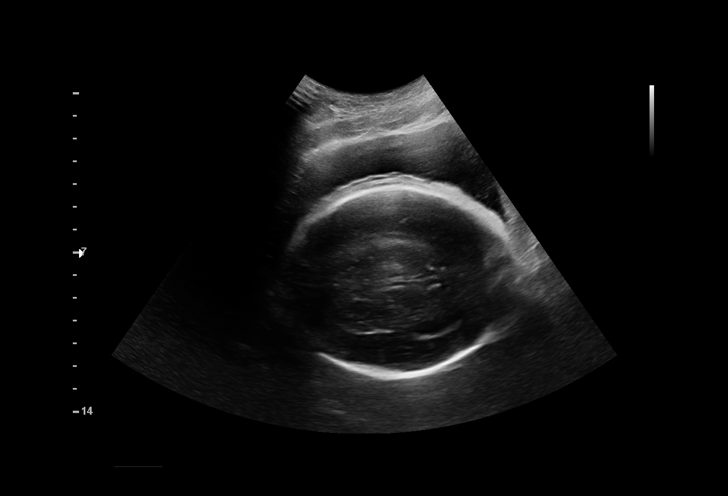
[im 29/37]
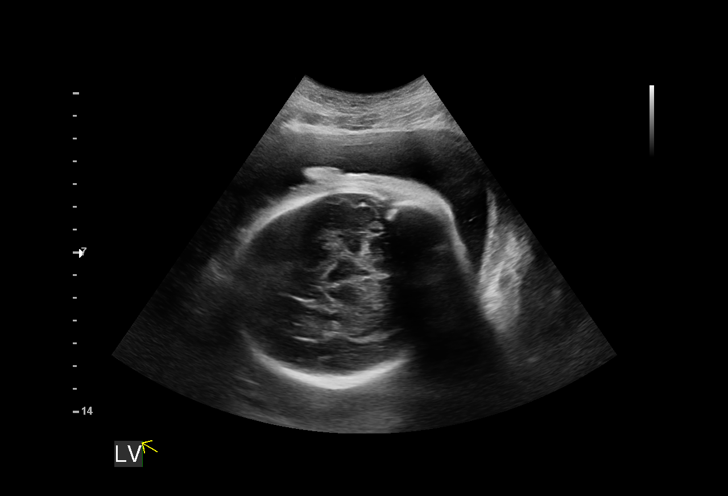
[im 31/37]
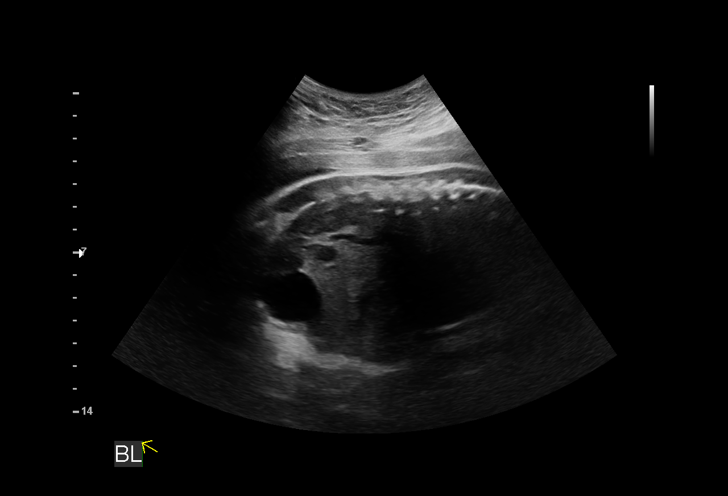
[im 34/37]
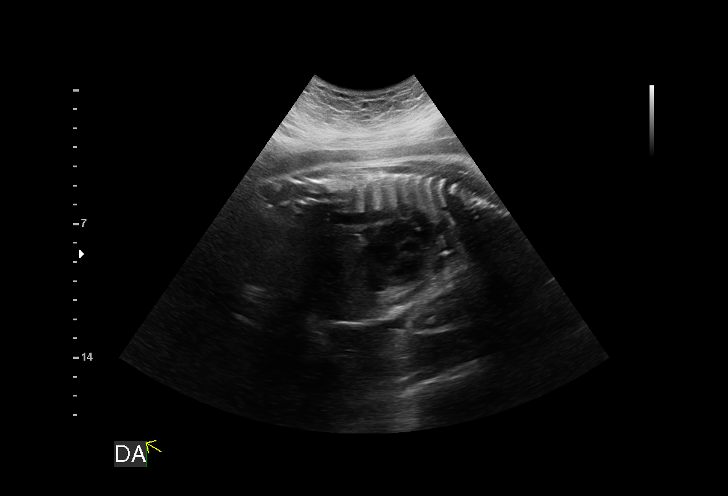
[im 37/37]
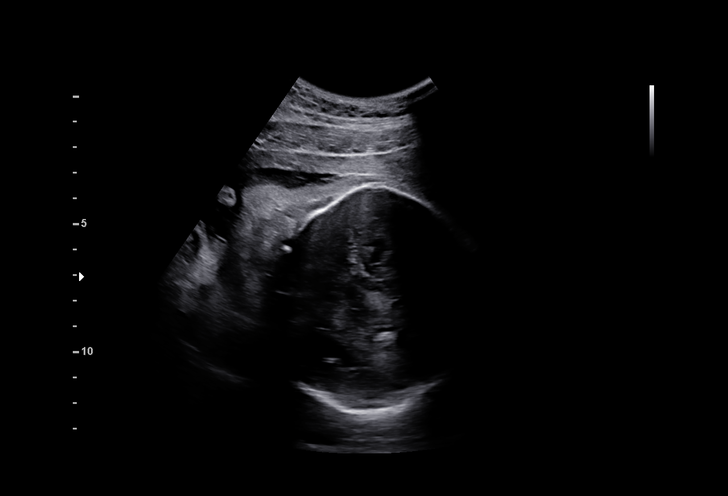

[14 of 28 positions shown; findings below may reference images not displayed]

Indications

 Pre-existing diabetes, type 2, in pregnancy,
 third trimester
 Hypertension - Chronic/Pre-existing(No
 Meds)
 32 weeks gestation of pregnancy
 Uterine fibroid
 Low Risk NIPS(Negative AFP)
 Genetic carrier ([place])
 Hyperthyroidism
Fetal Evaluation

 Num Of Fetuses:         1
 Preg. Location:         Intrauterine
 Fetal Heart Rate(bpm):  153
 Cardiac Activity:       Observed
 Presentation:           Cephalic
 Placenta:               Posterior

 Amniotic Fluid
 AFI FV:      Within normal limits

 AFI Sum(cm)     %Tile       Largest Pocket(cm)
 22.12           85

 RUQ(cm)       RLQ(cm)       LUQ(cm)        LLQ(cm)

Biophysical Evaluation

 Amniotic F.V:   Within normal limits       F. Tone:        Observed
 F. Movement:    Observed                   Score:          [DATE]
 F. Breathing:   Observed
Biometry

 BPD:      84.1  mm     G. Age:  33w 6d         89  %    CI:        76.92   %    70 - 86
                                                         FL/HC:      21.2   %    19.1 -
 HC:      303.7  mm     G. Age:  33w 5d         61  %    HC/AC:      0.99        0.96 -
 AC:      305.3  mm     G. Age:  34w 3d         97  %    FL/BPD:     76.5   %    71 - 87
 FL:       64.3  mm     G. Age:  33w 1d         70  %    FL/AC:      21.1   %    20 - 24
 HUM:      54.3  mm     G. Age:  31w 4d         42  %

 LV:        6.2  mm

 Est. FW:    1616  gm      5 lb 2 oz     93  %
OB History

 Gravidity:    1         Term:   0        Prem:   0        SAB:   0
 TOP:          0       Ectopic:  0        Living: 0
Gestational Age

 LMP:           32w 0d        Date:  07/09/20                 EDD:   04/15/21
 U/S Today:     33w 6d                                        EDD:   04/02/21
 Best:          32w 0d     Det. By:  LMP  (07/09/20)          EDD:   04/15/21
Anatomy

 Cranium:               Appears normal         Kidneys:                Appear normal
 Cavum:                 Appears normal         Bladder:                Appears normal
 Ventricles:            Appears normal         Upper Extremities:      Visualized
 Heart:                 Limited Views          Lower Extremities:      Visualized
 Stomach:               Appears normal, left
                        sided
Cervix Uterus Adnexa

 Cervix
 Not visualized (advanced GA >64wks)

 Right Ovary
 Not visualized.

 Left Ovary
 Not visualized.
Comments

 This patient was seen for a follow up growth scan due to
 chronic hypertension treated with nifedipine and
 pregestational diabetes treated with insulin.  She denies any
 problems since her last exam.
 She was informed that the fetal growth measures large for
 her gestational [AGE][REDACTED] percentile).
 There was normal amniotic fluid noted today.
 A biophysical profile performed today was [DATE].
 Due to chronic hypertension and pregestational diabetes, she
 should continue weekly fetal testing until delivery.  The
 patient already has nonstress tests/biophysical profiles
 scheduled in your office.
 A follow-up growth scan and biophysical profile was
 scheduled in our office in 4 weeks.

## 2023-01-25 ENCOUNTER — Other Ambulatory Visit (HOSPITAL_COMMUNITY)
Admission: RE | Admit: 2023-01-25 | Discharge: 2023-01-25 | Disposition: A | Discharge status: Home / Self Care | Payer: Medicaid Other | Source: Ambulatory Visit | Attending: Obstetrics & Gynecology | Admitting: Obstetrics & Gynecology

## 2023-01-25 ENCOUNTER — Ambulatory Visit (INDEPENDENT_AMBULATORY_CARE_PROVIDER_SITE_OTHER): Payer: Medicaid Other | Admitting: Emergency Medicine

## 2023-01-25 VITALS — BP 153/97 | HR 106 | Ht 68.0 in | Wt 164.9 lb

## 2023-01-25 DIAGNOSIS — N898 Other specified noninflammatory disorders of vagina: Secondary | ICD-10-CM

## 2023-01-25 NOTE — Progress Notes (Addendum)
SUBJECTIVE:  27 y.o. female complains of clear vaginal discharge and odor for 1 week(s). Denies abnormal vaginal bleeding or significant pelvic pain or fever. No UTI symptoms. Denies history of known exposure to STD.  No LMP recorded.  OBJECTIVE:  She appears well, afebrile.   ASSESSMENT:  Vaginal Discharge  Vaginal Odor   PLAN:  GC, chlamydia, trichomonas, BVAG, CVAG probe sent to lab. Treatment: To be determined once lab results are received ROV prn if symptoms persist or worsen.

## 2023-01-26 LAB — CERVICOVAGINAL ANCILLARY ONLY
Bacterial Vaginitis (gardnerella): POSITIVE — AB
Candida Glabrata: NEGATIVE
Candida Vaginitis: NEGATIVE
Chlamydia: NEGATIVE
Comment: NEGATIVE
Comment: NEGATIVE
Comment: NEGATIVE
Comment: NEGATIVE
Comment: NEGATIVE
Comment: NORMAL
Neisseria Gonorrhea: NEGATIVE
Trichomonas: NEGATIVE

## 2023-05-07 ENCOUNTER — Ambulatory Visit: Payer: Medicaid Other

## 2023-05-07 ENCOUNTER — Ambulatory Visit
Admission: EM | Admit: 2023-05-07 | Discharge: 2023-05-07 | Disposition: A | Discharge status: Home / Self Care | Payer: Medicaid Other | Attending: Physician Assistant | Admitting: Physician Assistant

## 2023-05-07 DIAGNOSIS — S93401A Sprain of unspecified ligament of right ankle, initial encounter: Secondary | ICD-10-CM

## 2023-05-07 DIAGNOSIS — M79645 Pain in left finger(s): Secondary | ICD-10-CM | POA: Diagnosis not present

## 2023-05-07 DIAGNOSIS — M79641 Pain in right hand: Secondary | ICD-10-CM

## 2023-05-07 DIAGNOSIS — M25572 Pain in left ankle and joints of left foot: Secondary | ICD-10-CM | POA: Diagnosis not present

## 2023-05-07 NOTE — ED Triage Notes (Signed)
"  I missed a step going down and twisted left ankle and then fell hurting left hand index finger". No head injury. No abrasions. No lacerations.

## 2023-05-14 NOTE — ED Provider Notes (Signed)
[facility]    CSN: [id number] Arrival date & time: 05/07/23  7829      History   Chief Complaint Chief Complaint  Patient presents with   Fall    HPI Ernesto Lashway is a 27 y.o. female.   Patient here today for evaluation of left ankle pain after she missed a step and fell twisting her left ankle and hurting her left index finger.  She denies any head injury.  She did not have loss of consciousness.  She does not have any abrasions or lacerations.  She denies any numbness or tingling.  The history is provided by the patient.  Fall Pertinent negatives include no abdominal pain.    Past Medical History:  Diagnosis Date   Anxiety    Asthma    COVID-19 08/16/2020   DM (diabetes mellitus), type 2 (HCC)    Fibromyalgia    Genital herpes    History of marijuana use 2021   Hypercholesteremia    Hypertension    Nexplanon insertion 08/26/2013   Lot #[id number] Exp 02/2016    PCOS (polycystic ovarian syndrome)     Patient Active Problem List   Diagnosis Date Noted   Normocytic anemia 05/11/2022   Adjustment disorder with depressed mood 05/08/2022   Current moderate episode of major depressive disorder without prior episode (HCC) 05/08/2022   Chronic hypertension with superimposed pre-eclampsia 03/29/2021   Marijuana use 12/28/2020   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   Anemia during pregnancy in second trimester 11/18/2020   History of COVID-19 11/18/2020   H/O fibromyalgia 10/21/2020   Alpha thalassemia silent carrier 10/20/2020   Subclinical hyperthyroidism 10/18/2020   Type 2 diabetes mellitus (HCC) 01/07/2014   Moderate mixed hyperlipidemia not requiring statin therapy 10/19/2011   Chronic hypertension 10/19/2011    Past Surgical History:  Procedure Laterality Date   abscess removal      TONSILLECTOMY     TONSILLECTOMY AND ADENOIDECTOMY     tonsils only    OB History     Gravida  1   Para  1   Term  1   Preterm  0   AB  0   Living  1       SAB  0   IAB  0   Ectopic  0   Multiple  0   Live Births  1            Home Medications    Prior to Admission medications   Medication Sig Start Date End Date Taking? Authorizing Provider  cloNIDine (CATAPRES) 0.3 MG tablet Take 0.5 mg by mouth daily.   Yes [provider]  lamoTRIgine (LAMICTAL) 100 MG tablet Take 100 mg by mouth daily.   Yes [provider]  albuterol (VENTOLIN HFA) 108 (90 Base) MCG/ACT inhaler Inhale 2 puffs into the lungs every 6 (six) hours as needed for wheezing or shortness of breath. 03/09/21   Constant, Peggy, MD  metFORMIN (GLUCOPHAGE) 500 MG tablet Take 500 mg by mouth 2 (two) times daily. 04/06/22   [provider]  metroNIDAZOLE (FLAGYL) 500 MG tablet Take 1 tablet (500 mg total) by mouth 2 (two) times daily. Patient not taking: Reported on 01/25/2023 06/14/22   Brock Bad, MD  omeprazole (PRILOSEC) 20 MG capsule TAKE 1 CAPSULE (20 MG TOTAL) BY MOUTH IN THE MORNING AND AT BEDTIME. Patient not taking: No sig reported 02/08/21 02/08/22  Venora Maples, MD  insulin aspart (NOVOLOG) 100 UNIT/ML injection Inject 8 Units into  the skin 3 (three) times daily before meals. Patient not taking: Reported on 09/17/2020 08/16/20 09/22/20  Bernerd Limbo, CNM    Family History Family History  Problem Relation Age of Onset   Hypertension Mother    Diabetes Mother    Hypertension Maternal Grandmother    Hypertension Maternal Grandfather    Hypertension Paternal Grandmother    Diabetes Paternal Grandmother     Social History Social History   Tobacco Use   Smoking status: Former    Current packs/day: 0.00    Types: Cigarettes    Quit date: 05/28/2014    Years since quitting: 8.9   Smokeless tobacco: Never  Vaping Use   Vaping status: Never Used  Substance Use Topics   Alcohol use: Yes    Comment: sometimes   Drug use: Yes    Types: Marijuana    Comment: stopped once pregnant     Allergies    Metformin   Review of Systems Review of Systems  Constitutional:  Negative for chills and fever.  Eyes:  Negative for discharge and redness.  Gastrointestinal:  Negative for abdominal pain, nausea and vomiting.  Musculoskeletal:  Positive for arthralgias and joint swelling.  Skin:  Negative for wound.  Neurological:  Negative for numbness.     Physical Exam Triage Vital Signs ED Triage Vitals  Encounter Vitals Group     BP 05/07/23 1046 (!) 144/97     Systolic BP Percentile --      Diastolic BP Percentile --      Pulse Rate 05/07/23 1046 71     Resp 05/07/23 1046 18     Temp 05/07/23 1046 97.9 F (36.6 C)     Temp Source 05/07/23 1046 Oral     SpO2 05/07/23 1046 100 %     Weight 05/07/23 1043 164 lb (74.4 kg)     Height 05/07/23 1043 5\' 8"  (1.727 m)     Head Circumference --      Peak Flow --      Pain Score 05/07/23 1039 10     Pain Loc --      Pain Education --      Exclude from Growth Chart --    No data found.  Updated Vital Signs BP 123/75 (BP Location: Right Arm)   Pulse 71   Temp 97.9 F (36.6 C) (Oral)   Resp 18   Ht 5\' 8"  (1.727 m)   Wt 164 lb (74.4 kg)   LMP 05/07/2023 (Exact Date)   SpO2 100%   BMI 24.94 kg/m      Physical Exam Vitals and nursing note reviewed.  Constitutional:      General: She is not in acute distress.    Appearance: Normal appearance. She is not ill-appearing.  HENT:     Head: Normocephalic and atraumatic.  Eyes:     Conjunctiva/sclera: Conjunctivae normal.  Cardiovascular:     Rate and Rhythm: Normal rate.  Pulmonary:     Effort: Pulmonary effort is normal. No respiratory distress.  Musculoskeletal:     Comments: Mild swelling to left ankle with tenderness palpation diffusely, decreased range of motion due to pain.  Mild swelling to left index finger as well with decreased range of motion.  Skin:    Capillary Refill: Normal cap refill to the left index finger distally Neurological:     Mental Status: She is alert.      Comments: Gross sensation intact to left index finger  Psychiatric:  Mood and Affect: Mood normal.        Behavior: Behavior normal.        Thought Content: Thought content normal.      UC Treatments / Results  Labs (all labs ordered are listed, but only abnormal results are displayed) Labs Reviewed - No data to display  EKG   Radiology No results found.  Procedures Procedures (including critical care time)  Medications Ordered in UC Medications - No data to display  Initial Impression / Assessment and Plan / UC Course  I have reviewed the triage vital signs and the nursing notes.  Pertinent labs & imaging results that were available during my care of the patient were reviewed by me and considered in my medical decision making (see chart for details).    No fracture noted on x-ray of the left hand or left ankle.  Recommended RICE therapy, ibuprofen or Tylenol if needed.  Encouraged follow-up with Ortho if no gradual improvement with any further concerns.  Final Clinical Impressions(s) / UC Diagnoses   Final diagnoses:  Sprain of right ankle, unspecified ligament, initial encounter  Right hand pain   Discharge Instructions   None    ED Prescriptions   None    PDMP not reviewed this encounter.   Tomi Bamberger, PA-C 05/14/23 1944

## 2023-09-08 ENCOUNTER — Encounter (HOSPITAL_BASED_OUTPATIENT_CLINIC_OR_DEPARTMENT_OTHER): Payer: Self-pay | Admitting: Emergency Medicine

## 2023-09-08 ENCOUNTER — Encounter (HOSPITAL_COMMUNITY): Payer: Self-pay

## 2023-09-08 ENCOUNTER — Other Ambulatory Visit: Payer: Self-pay

## 2023-09-08 ENCOUNTER — Emergency Department (HOSPITAL_BASED_OUTPATIENT_CLINIC_OR_DEPARTMENT_OTHER): Payer: Medicaid Other | Admitting: Radiology

## 2023-09-08 ENCOUNTER — Emergency Department (HOSPITAL_BASED_OUTPATIENT_CLINIC_OR_DEPARTMENT_OTHER)
Admission: EM | Admit: 2023-09-08 | Discharge: 2023-09-08 | Disposition: A | Discharge status: Home / Self Care | Payer: Medicaid Other | Attending: Emergency Medicine | Admitting: Emergency Medicine

## 2023-09-08 ENCOUNTER — Emergency Department (HOSPITAL_COMMUNITY)
Admission: EM | Admit: 2023-09-08 | Discharge: 2023-09-08 | Discharge status: Against Medical Advice | Payer: Medicaid Other | Attending: Emergency Medicine | Admitting: Emergency Medicine

## 2023-09-08 DIAGNOSIS — B349 Viral infection, unspecified: Secondary | ICD-10-CM | POA: Insufficient documentation

## 2023-09-08 DIAGNOSIS — I10 Essential (primary) hypertension: Secondary | ICD-10-CM | POA: Insufficient documentation

## 2023-09-08 DIAGNOSIS — Z79899 Other long term (current) drug therapy: Secondary | ICD-10-CM | POA: Insufficient documentation

## 2023-09-08 DIAGNOSIS — Z5321 Procedure and treatment not carried out due to patient leaving prior to being seen by health care provider: Secondary | ICD-10-CM | POA: Diagnosis not present

## 2023-09-08 DIAGNOSIS — J029 Acute pharyngitis, unspecified: Secondary | ICD-10-CM | POA: Diagnosis present

## 2023-09-08 DIAGNOSIS — Z7984 Long term (current) use of oral hypoglycemic drugs: Secondary | ICD-10-CM | POA: Insufficient documentation

## 2023-09-08 DIAGNOSIS — E119 Type 2 diabetes mellitus without complications: Secondary | ICD-10-CM | POA: Insufficient documentation

## 2023-09-08 DIAGNOSIS — Z20822 Contact with and (suspected) exposure to covid-19: Secondary | ICD-10-CM | POA: Diagnosis not present

## 2023-09-08 DIAGNOSIS — Z794 Long term (current) use of insulin: Secondary | ICD-10-CM | POA: Insufficient documentation

## 2023-09-08 DIAGNOSIS — J45909 Unspecified asthma, uncomplicated: Secondary | ICD-10-CM | POA: Insufficient documentation

## 2023-09-08 LAB — CBC WITH DIFFERENTIAL/PLATELET
Abs Immature Granulocytes: 0.03 10*3/uL (ref 0.00–0.07)
Basophils Absolute: 0 10*3/uL (ref 0.0–0.1)
Basophils Relative: 0 %
Eosinophils Absolute: 0 10*3/uL (ref 0.0–0.5)
Eosinophils Relative: 0 %
HCT: 34.7 % — ABNORMAL LOW (ref 36.0–46.0)
Hemoglobin: 10.8 g/dL — ABNORMAL LOW (ref 12.0–15.0)
Immature Granulocytes: 0 %
Lymphocytes Relative: 20 %
Lymphs Abs: 2.1 10*3/uL (ref 0.7–4.0)
MCH: 23.6 pg — ABNORMAL LOW (ref 26.0–34.0)
MCHC: 31.1 g/dL (ref 30.0–36.0)
MCV: 75.9 fL — ABNORMAL LOW (ref 80.0–100.0)
Monocytes Absolute: 0.6 10*3/uL (ref 0.1–1.0)
Monocytes Relative: 5 %
Neutro Abs: 7.9 10*3/uL — ABNORMAL HIGH (ref 1.7–7.7)
Neutrophils Relative %: 75 %
Platelets: 314 10*3/uL (ref 150–400)
RBC: 4.57 MIL/uL (ref 3.87–5.11)
RDW: 14.5 % (ref 11.5–15.5)
WBC: 10.6 10*3/uL — ABNORMAL HIGH (ref 4.0–10.5)
nRBC: 0 % (ref 0.0–0.2)

## 2023-09-08 LAB — BASIC METABOLIC PANEL
Anion gap: 8 (ref 5–15)
BUN: 11 mg/dL (ref 6–20)
CO2: 27 mmol/L (ref 22–32)
Calcium: 9.3 mg/dL (ref 8.9–10.3)
Chloride: 102 mmol/L (ref 98–111)
Creatinine, Ser: 0.9 mg/dL (ref 0.44–1.00)
GFR, Estimated: >60 mL/min (ref >60)
Glucose, Bld: 166 mg/dL — ABNORMAL HIGH (ref 70–99)
Potassium: 3.8 mmol/L (ref 3.5–5.1)
Sodium: 137 mmol/L (ref 135–145)

## 2023-09-08 LAB — HCG, SERUM, QUALITATIVE: Preg, Serum: NEGATIVE

## 2023-09-08 LAB — RESP PANEL BY RT-PCR (RSV, FLU A&B, COVID)  RVPGX2
Influenza A by PCR: NEGATIVE
Influenza B by PCR: NEGATIVE
Resp Syncytial Virus by PCR: NEGATIVE
SARS Coronavirus 2 by RT PCR: NEGATIVE

## 2023-09-08 LAB — GROUP A STREP BY PCR: Group A Strep by PCR: NOT DETECTED

## 2023-09-08 MED ORDER — KETOROLAC TROMETHAMINE 60 MG/2ML IM SOLN
30.0000 mg | Freq: Once | INTRAMUSCULAR | Status: AC
  Administered 2023-09-08: 30 mg via INTRAMUSCULAR
  Filled 2023-09-08: qty 2

## 2023-09-08 MED ORDER — LIDOCAINE VISCOUS HCL 2 % MT SOLN
15.0000 mL | Freq: Once | OROMUCOSAL | Status: AC
  Administered 2023-09-08: 15 mL via OROMUCOSAL
  Filled 2023-09-08: qty 15

## 2023-09-08 NOTE — ED Provider Notes (Signed)
[place] EMERGENCY DEPARTMENT AT [facility] Provider Note   CSN: [id number] Arrival date & time: 09/08/23  4696     History  Chief Complaint  Patient presents with   Sore Throat   Generalized Body Aches    Tamara Reyes is a 28 y.o. female.   Sore Throat     28 year old female with medical history significant for HTN, PCOS, asthma, HLD, fibromyalgia, DM2 who presents to the emergency department with a sore throat and generalized bodyaches.  The patient endorses difficulty swallowing, denies any unilateral nature to her sore throat, feels as if "there is infection deeper in my neck."  She was seen at [facility] earlier this morning and had an RVP to include COVID, influenza, RSV PCR testing which was negative and strep PCR testing which is negative.  She states that she had a tonsillectomy years ago.  She endorses a cough and pleuritic discomfort.  She is not concerned for sexually transmitted infection.  Home Medications Prior to Admission medications   Medication Sig Start Date End Date Taking? Authorizing Provider  acetaminophen-codeine (TYLENOL #3) 300-30 MG tablet Take 1 tablet by mouth every 6 (six) hours.    [provider]  albuterol (VENTOLIN HFA) 108 (90 Base) MCG/ACT inhaler Inhale 2 puffs into the lungs every 6 (six) hours as needed for wheezing or shortness of breath. 03/09/21   Constant, Peggy, MD  amoxicillin (AMOXIL) 500 MG capsule Take 500 mg by mouth every 8 (eight) hours.    [provider]  chlorhexidine (PERIDEX) 0.12 % solution  07/17/23   [provider]  clonazePAM (KLONOPIN) 0.5 MG tablet Take 0.5 mg by mouth 2 (two) times daily as needed.    [provider]  cloNIDine (CATAPRES) 0.3 MG tablet Take 0.5 mg by mouth daily.    [provider]  gabapentin (NEURONTIN) 100 MG capsule Take 100 mg by mouth 3 (three) times daily. 06/18/23   [provider]  HYDROcodone-acetaminophen (NORCO) 7.5-325 MG  tablet Take 1 tablet by mouth every 4 (four) hours as needed. 07/17/23   [provider]  ibuprofen (ADVIL) 800 MG tablet Take 800 mg by mouth every 6 (six) hours as needed.    [provider]  lamoTRIgine (LAMICTAL XR) 100 MG 24 hour tablet 100 mg once daily 04/03/23   [provider]  lamoTRIgine (LAMICTAL XR) 100 MG 24 hour tablet Take 100 mg by mouth daily. 06/19/23   [provider]  lamoTRIgine (LAMICTAL) 100 MG tablet Take 100 mg by mouth daily.    [provider]  metFORMIN (GLUCOPHAGE) 500 MG tablet Take 500 mg by mouth 2 (two) times daily. 04/06/22   [provider]  metroNIDAZOLE (FLAGYL) 500 MG tablet Take 1 tablet (500 mg total) by mouth 2 (two) times daily. Patient not taking: Reported on 01/25/2023 06/14/22   Brock Bad, MD  omeprazole (PRILOSEC) 20 MG capsule TAKE 1 CAPSULE (20 MG TOTAL) BY MOUTH IN THE MORNING AND AT BEDTIME. Patient not taking: No sig reported 02/08/21 02/08/22  Venora Maples, MD  pregabalin (LYRICA) 150 MG capsule Take by mouth. 08/20/23   [provider]  traZODone (DESYREL) 50 MG tablet 50 mg nightly at bedtime as needed for sleep 03/02/23   [provider]  insulin aspart (NOVOLOG) 100 UNIT/ML injection Inject 8 Units into the skin 3 (three) times daily before meals. Patient not taking: Reported on 09/17/2020 08/16/20 09/22/20  Bernerd Limbo, CNM      Allergies  Metformin    Review of Systems   Review of Systems  Respiratory:  Positive for cough.   All other systems reviewed and are negative.   Physical Exam Updated Vital Signs BP (!) 144/100 (BP Location: Right Arm)   Pulse 66   Temp 98.1 F (36.7 C) (Oral)   Resp 18   Ht 5\' 8"  (1.727 m)   Wt 74.4 kg   SpO2 100%   BMI 24.94 kg/m  Physical Exam Vitals and nursing note reviewed.  Constitutional:      General: She is not in acute distress.    Appearance: She is well-developed.  HENT:     Head: Normocephalic and  atraumatic.     Comments: Range of motion of the neck intact, no significant oropharyngeal swelling bilaterally Eyes:     Conjunctiva/sclera: Conjunctivae normal.     Pupils: Pupils are equal, round, and reactive to light.  Cardiovascular:     Rate and Rhythm: Normal rate and regular rhythm.     Heart sounds: No murmur heard. Pulmonary:     Effort: Pulmonary effort is normal. No respiratory distress.     Breath sounds: Normal breath sounds.  Abdominal:     General: There is no distension.     Palpations: Abdomen is soft.     Tenderness: There is no abdominal tenderness. There is no guarding.  Musculoskeletal:        General: No swelling, deformity or signs of injury.     Cervical back: Neck supple.  Skin:    General: Skin is warm and dry.     Capillary Refill: Capillary refill takes less than 2 seconds.     Findings: No lesion or rash.  Neurological:     General: No focal deficit present.     Mental Status: She is alert. Mental status is at baseline.  Psychiatric:        Mood and Affect: Mood normal.     ED Results / Procedures / Treatments   Labs (all labs ordered are listed, but only abnormal results are displayed) Labs Reviewed  CBC WITH DIFFERENTIAL/PLATELET - Abnormal; Notable for the following components:      Result Value   WBC 10.6 (*)    Hemoglobin 10.8 (*)    HCT 34.7 (*)    MCV 75.9 (*)    MCH 23.6 (*)    Neutro Abs 7.9 (*)    All other components within normal limits  BASIC METABOLIC PANEL - Abnormal; Notable for the following components:   Glucose, Bld 166 (*)    All other components within normal limits  CULTURE, BLOOD (ROUTINE X 2)  CULTURE, BLOOD (ROUTINE X 2)  HCG, SERUM, QUALITATIVE    EKG None  Radiology DG Neck Soft Tissue Result Date: 09/08/2023 CLINICAL DATA:  Sore throat EXAM: NECK SOFT TISSUES - 1+ VIEW COMPARISON:  None Available. FINDINGS: There is no evidence of retropharyngeal soft tissue swelling or epiglottic enlargement. The  cervical airway is unremarkable and no radio-opaque foreign body identified. IMPRESSION: Negative. Electronically Signed   By: Kennith Center M.D.   On: 09/08/2023 08:39   DG Chest 2 View Result Date: 09/08/2023 CLINICAL DATA:  Chest discomfort EXAM: CHEST - 2 VIEW COMPARISON:  08/16/2020 FINDINGS: The lungs are clear without focal pneumonia, edema, pneumothorax or pleural effusion. The cardiopericardial silhouette is within normal limits for size. No acute bony abnormality. IMPRESSION: No active cardiopulmonary disease. Electronically Signed   By: Kennith Center M.D.   On: 09/08/2023 08:38  Procedures Procedures    Medications Ordered in ED Medications  lidocaine (XYLOCAINE) 2 % viscous mouth solution 15 mL (15 mLs Mouth/Throat Given 09/08/23 0740)  ketorolac (TORADOL) injection 30 mg (30 mg Intramuscular Given 09/08/23 0454)    ED Course/ Medical Decision Making/ A&P                                 Medical Decision Making Amount and/or Complexity of Data Reviewed Labs: ordered. Radiology: ordered.  Risk Prescription drug management.    28 year old female with medical history significant for HTN, PCOS, asthma, HLD, fibromyalgia, DM2 who presents to the emergency department with a sore throat and generalized bodyaches.  The patient endorses difficulty swallowing, denies any unilateral nature to her sore throat, feels as if "there is infection deeper in my neck."  She was seen at [facility] earlier this morning and had an RVP to include COVID, influenza, RSV PCR testing which was negative and strep PCR testing which is negative.  She states that she had a tonsillectomy years ago.  She endorses a cough and pleuritic discomfort.  She is not concerned for sexually transmitted infection.  On arrival, the patient was afebrile, not tachycardic or tachypneic, BP 144/100, saturating 100% on room air.  Physical exam generally unremarkable with range of motion of the neck intact, no oropharyngeal  swelling, lungs CTAB bilaterally.  Patient presenting with bodyaches and chills in the setting of a cough for 1 week.  Negative viral panel and strep PCR.  Given the patient's symptoms, will obtain chest x-ray, neck x-ray in addition to screening labs.  Viscous lidocaine administered orally in addition to a shot of IM Toradol.  X-ray imaging of the chest and neck revealed no acute abnormalities.  No evidence of soft tissue swelling in the neck.  Low concern for retropharyngeal abscess, low concern for PTA.  No evidence for Ludwig's Angina.  Suspect likely viral pharyngitis.  Chest x-ray without focal consolidation to suggest pneumonia, no evidence of pneumothorax.  Symptoms are consistent with likely viral URI and pharyngitis.  Advised continued supportive care.  Patient feeling symptomatically improved following lidocaine orally and IM Toradol.  She was tolerating oral intake and overall well-appearing and vitally stable.  Return precautions righted in the event of any severe worsening symptoms.   Final Clinical Impression(s) / ED Diagnoses Final diagnoses:  Pharyngitis with viral syndrome    Rx / DC Orders ED Discharge Orders     None         Ernie Avena, MD 09/08/23 [phone]

## 2023-09-08 NOTE — ED Notes (Signed)
Dc instructions reviewed with patient. Patient voiced understanding. Dc with belongings.

## 2023-09-08 NOTE — ED Triage Notes (Signed)
Pt. Arrives POV for sore throat and congestion x1 week. No improvement with OTC meds. States that it is hard to swallow. Noticed glandular swelling about 2 days ago, but has since gone down.

## 2023-09-08 NOTE — Discharge Instructions (Addendum)
Your COVID, influenza, RSV PCR testing in addition to strep PCR testing was negative.  Your x-ray imaging was reassuring.  Your labs were also reassuring.  Your symptoms are consistent with a viral pharyngitis.  Recommend continued supportive care with Tylenol and ibuprofen as needed for muscle aches and fever, continued oral rehydration.  Return to the emergency department for any severe worsening neck swelling, difficulty opening your mouth or feeling as if you cannot swallow or breathe.

## 2023-09-08 NOTE — ED Triage Notes (Signed)
  Patient comes in with sore throat and body aches that have been going on for 1 week.  Patient endorses dysphagia.  No OTC meds taken at home.  Had negative RVP and strep swab at [facility] earlier this morning.  Pain 10/10, sharp.

## 2023-09-13 LAB — CULTURE, BLOOD (ROUTINE X 2)
Culture: NO GROWTH
Special Requests: ADEQUATE

## 2024-05-25 ENCOUNTER — Emergency Department (HOSPITAL_BASED_OUTPATIENT_CLINIC_OR_DEPARTMENT_OTHER)
Admission: EM | Admit: 2024-05-25 | Discharge: 2024-05-25 | Disposition: A | Discharge status: Home / Self Care | Attending: Emergency Medicine | Admitting: Emergency Medicine

## 2024-05-25 ENCOUNTER — Emergency Department (HOSPITAL_BASED_OUTPATIENT_CLINIC_OR_DEPARTMENT_OTHER)

## 2024-05-25 ENCOUNTER — Other Ambulatory Visit: Payer: Self-pay

## 2024-05-25 ENCOUNTER — Encounter (HOSPITAL_BASED_OUTPATIENT_CLINIC_OR_DEPARTMENT_OTHER): Payer: Self-pay

## 2024-05-25 ENCOUNTER — Emergency Department (HOSPITAL_BASED_OUTPATIENT_CLINIC_OR_DEPARTMENT_OTHER): Admitting: Radiology

## 2024-05-25 DIAGNOSIS — S39012A Strain of muscle, fascia and tendon of lower back, initial encounter: Secondary | ICD-10-CM | POA: Diagnosis not present

## 2024-05-25 DIAGNOSIS — S161XXA Strain of muscle, fascia and tendon at neck level, initial encounter: Secondary | ICD-10-CM | POA: Diagnosis not present

## 2024-05-25 DIAGNOSIS — E119 Type 2 diabetes mellitus without complications: Secondary | ICD-10-CM | POA: Insufficient documentation

## 2024-05-25 DIAGNOSIS — Y92410 Unspecified street and highway as the place of occurrence of the external cause: Secondary | ICD-10-CM | POA: Insufficient documentation

## 2024-05-25 DIAGNOSIS — J45909 Unspecified asthma, uncomplicated: Secondary | ICD-10-CM | POA: Diagnosis not present

## 2024-05-25 DIAGNOSIS — I10 Essential (primary) hypertension: Secondary | ICD-10-CM | POA: Insufficient documentation

## 2024-05-25 DIAGNOSIS — S060X0A Concussion without loss of consciousness, initial encounter: Secondary | ICD-10-CM | POA: Insufficient documentation

## 2024-05-25 DIAGNOSIS — S0990XA Unspecified injury of head, initial encounter: Secondary | ICD-10-CM | POA: Diagnosis present

## 2024-05-25 DIAGNOSIS — Z8616 Personal history of COVID-19: Secondary | ICD-10-CM | POA: Diagnosis not present

## 2024-05-25 DIAGNOSIS — S29012A Strain of muscle and tendon of back wall of thorax, initial encounter: Secondary | ICD-10-CM | POA: Diagnosis not present

## 2024-05-25 DIAGNOSIS — Z7984 Long term (current) use of oral hypoglycemic drugs: Secondary | ICD-10-CM | POA: Diagnosis not present

## 2024-05-25 DIAGNOSIS — T148XXA Other injury of unspecified body region, initial encounter: Secondary | ICD-10-CM

## 2024-05-25 LAB — HCG, SERUM, QUALITATIVE: Preg, Serum: NEGATIVE

## 2024-05-25 MED ORDER — FENTANYL CITRATE (PF) 50 MCG/ML IJ SOSY
50.0000 ug | PREFILLED_SYRINGE | Freq: Once | INTRAMUSCULAR | Status: AC
  Administered 2024-05-25: 50 ug via INTRAVENOUS
  Filled 2024-05-25: qty 1

## 2024-05-25 MED ORDER — ONDANSETRON HCL 4 MG/2ML IJ SOLN
4.0000 mg | Freq: Once | INTRAMUSCULAR | Status: AC
  Administered 2024-05-25: 4 mg via INTRAVENOUS
  Filled 2024-05-25: qty 2

## 2024-05-25 MED ORDER — KETOROLAC TROMETHAMINE 15 MG/ML IJ SOLN
15.0000 mg | Freq: Once | INTRAMUSCULAR | Status: AC
  Administered 2024-05-25: 15 mg via INTRAVENOUS
  Filled 2024-05-25: qty 1
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
## 2024-08-27 NOTE — Progress Notes (Unsigned)
 "  Office Visit Note  Patient: Tamara Reyes             Date of Birth: [birth year]           MRN: [id number]             PCP: Buck Search, PA-C Referring: Netta Ram, NP Visit Date: 09/09/2024 Occupation: Data Unavailable  Subjective:  No chief complaint on file.   History of Present Illness: Tamara Reyes is a 29 y.o. female ***     Activities of Daily Living:  Patient reports morning stiffness for *** {minute/hour:19697}.   Patient {ACTIONS;DENIES/REPORTS:21021675::Denies} nocturnal pain.  Difficulty dressing/grooming: {ACTIONS;DENIES/REPORTS:21021675::Denies} Difficulty climbing stairs: {ACTIONS;DENIES/REPORTS:21021675::Denies} Difficulty getting out of chair: {ACTIONS;DENIES/REPORTS:21021675::Denies} Difficulty using hands for taps, buttons, cutlery, and/or writing: {ACTIONS;DENIES/REPORTS:21021675::Denies}  No Rheumatology ROS completed.   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   Anemia during pregnancy in second trimester 11/18/2020   History of COVID-19 11/18/2020   H/O fibromyalgia 10/21/2020   Alpha thalassemia silent carrier 10/20/2020   Subclinical hyperthyroidism 10/18/2020   Type 2 diabetes mellitus (HCC) 01/07/2014   Moderate mixed hyperlipidemia not requiring statin therapy 10/19/2011   Chronic hypertension 10/19/2011    Past Medical History:  Diagnosis Date   Anxiety    Asthma    COVID-19 08/16/2020   DM (diabetes mellitus), type 2 (HCC)    Fibromyalgia    Genital herpes    History of marijuana use 2021   Hypercholesteremia    Hypertension    Nexplanon  insertion 08/26/2013   Lot #[id number] Exp 02/2016     PCOS (polycystic ovarian syndrome)     Family History  Problem Relation Age of Onset   Hypertension Mother    Diabetes Mother    Hypertension Maternal Grandmother    Hypertension Maternal Grandfather    Hypertension Paternal Grandmother    Diabetes Paternal Grandmother    Past Surgical History:  Procedure Laterality Date   abscess removal      TONSILLECTOMY     TONSILLECTOMY AND ADENOIDECTOMY     tonsils only   Social History[1] Social History   Social History Narrative   Not on file     Immunization History  Administered Date(s) Administered   HPV 9-valent 05/03/2007, 08/02/2007   Influenza,inj,Quad PF,6+ Mos 05/11/2021   PPD Test 09/17/2020   Pneumococcal Polysaccharide-23 01/08/2014   Tdap 10/16/2018, 01/18/2021     Objective: Vital Signs: There were no vitals taken for this visit.   Physical Exam   Musculoskeletal Exam: ***  CDAI Exam: CDAI Score: -- Patient Global: --; Provider Global: -- Swollen: --; Tender: -- Joint Exam 09/09/2024   No joint exam has been documented for this visit   There is currently no information documented on the homunculus. Go to the Rheumatology activity and complete the homunculus joint exam.  Investigation: No additional findings.  Imaging: No results found.  Recent Labs: Lab Results  Component Value Date   WBC 10.6 (H) 09/08/2023   HGB 10.8 (L) 09/08/2023   PLT 314 09/08/2023   NA 137 09/08/2023   K 3.8 09/08/2023   CL 102 09/08/2023   CO2 27 09/08/2023   GLUCOSE 166 (H) 09/08/2023   BUN 11 09/08/2023   CREATININE  0.90 09/08/2023   BILITOT 0.3 06/22/2021   ALKPHOS 52 06/22/2021   AST 20 06/22/2021   ALT 28 06/22/2021   PROT 6.9 06/22/2021   ALBUMIN 4.3 06/22/2021   CALCIUM  9.3 09/08/2023   GFRAA 154 09/22/2020    Speciality Comments: No specialty comments available.  Procedures:  No procedures performed Allergies: Metformin    Assessment / Plan:     Visit Diagnoses: No diagnosis  found.  Orders: No orders of the defined types were placed in this encounter.  No orders of the defined types were placed in this encounter.   Face-to-face time spent with patient was *** minutes. Greater than 50% of time was spent in counseling and coordination of care.  Follow-Up Instructions: No follow-ups on file.   Alfonso Patterson, LPN  Note - This record has been created using [employer].  Chart creation errors have been sought, but may not always  have been located. Such creation errors do not reflect on  the standard of medical care.    [1]  Social History Tobacco Use   Smoking status: Former    Current packs/day: 0.00    Types: Cigarettes    Quit date: 05/28/2014    Years since quitting: 10.2   Smokeless tobacco: Never  Vaping Use   Vaping status: Never Used  Substance Use Topics   Alcohol use: Yes    Comment: sometimes   Drug use: Yes    Types: Marijuana    Comment: stopped once pregnant   "

## 2024-09-01 ENCOUNTER — Ambulatory Visit: Payer: Self-pay

## 2024-09-01 ENCOUNTER — Other Ambulatory Visit (HOSPITAL_COMMUNITY)
Admission: RE | Admit: 2024-09-01 | Discharge: 2024-09-01 | Disposition: A | Discharge status: Home / Self Care | Source: Ambulatory Visit | Attending: Obstetrics | Admitting: Obstetrics

## 2024-09-01 VITALS — BP 122/75 | HR 95

## 2024-09-01 DIAGNOSIS — Z113 Encounter for screening for infections with a predominantly sexual mode of transmission: Secondary | ICD-10-CM | POA: Insufficient documentation

## 2024-09-01 NOTE — Progress Notes (Signed)
 SUBJECTIVE:  29 y.o. female who desires a STI screen. Denies abnormal vaginal discharge, bleeding or significant pelvic pain. No UTI symptoms. Denies history of known exposure to STD.  No LMP recorded.  OBJECTIVE:  She appears well.   ASSESSMENT:  STI Screen   PLAN:  GC, chlamydia, and trichomonas probe sent to lab.  Treatment: To be determined once lab results are received.  Pt follow up as needed.

## 2024-09-02 ENCOUNTER — Ambulatory Visit: Payer: Self-pay | Admitting: Obstetrics and Gynecology

## 2024-09-02 LAB — CERVICOVAGINAL ANCILLARY ONLY
Chlamydia: NEGATIVE
Comment: NEGATIVE
Comment: NEGATIVE
Comment: NORMAL
Neisseria Gonorrhea: NEGATIVE
Trichomonas: POSITIVE — AB

## 2024-09-02 MED ORDER — METRONIDAZOLE 500 MG PO TABS: 500.0000 mg | ORAL_TABLET | Freq: Two times a day (BID) | ORAL | 0 refills | Status: AC

## 2024-09-09 ENCOUNTER — Ambulatory Visit

## 2024-09-12 ENCOUNTER — Ambulatory Visit
